# Patient Record
Sex: Female | Born: 1945 | Race: Black or African American | Hispanic: No | Smoking: Never smoker
Health system: Southern US, Community
[De-identification: ages and names within clinical notes are randomized; demographics above are authoritative.]

## PROBLEM LIST (undated history)

## (undated) DIAGNOSIS — E669 Obesity, unspecified: Secondary | ICD-10-CM

## (undated) DIAGNOSIS — R42 Dizziness and giddiness: Secondary | ICD-10-CM

## (undated) DIAGNOSIS — K219 Gastro-esophageal reflux disease without esophagitis: Secondary | ICD-10-CM

## (undated) DIAGNOSIS — D509 Iron deficiency anemia, unspecified: Secondary | ICD-10-CM

## (undated) DIAGNOSIS — K5900 Constipation, unspecified: Secondary | ICD-10-CM

## (undated) DIAGNOSIS — R1310 Dysphagia, unspecified: Secondary | ICD-10-CM

## (undated) DIAGNOSIS — E7800 Pure hypercholesterolemia, unspecified: Secondary | ICD-10-CM

## (undated) DIAGNOSIS — H409 Unspecified glaucoma: Secondary | ICD-10-CM

## (undated) DIAGNOSIS — I10 Essential (primary) hypertension: Secondary | ICD-10-CM

## (undated) DIAGNOSIS — Z8719 Personal history of other diseases of the digestive system: Secondary | ICD-10-CM

## (undated) DIAGNOSIS — F32A Depression, unspecified: Secondary | ICD-10-CM

## (undated) DIAGNOSIS — G9332 Myalgic encephalomyelitis/chronic fatigue syndrome: Secondary | ICD-10-CM

## (undated) DIAGNOSIS — S83249A Other tear of medial meniscus, current injury, unspecified knee, initial encounter: Secondary | ICD-10-CM

## (undated) DIAGNOSIS — M1990 Unspecified osteoarthritis, unspecified site: Secondary | ICD-10-CM

## (undated) HISTORY — DX: Unspecified glaucoma: H40.9

## (undated) HISTORY — DX: Depression, unspecified: F32.A

## (undated) HISTORY — PX: CHOLECYSTECTOMY: SHX55

## (undated) HISTORY — DX: Dysphagia, unspecified: R13.10

## (undated) HISTORY — DX: Constipation, unspecified: K59.00

## (undated) HISTORY — DX: Dizziness and giddiness: R42

## (undated) HISTORY — PX: SHOULDER ARTHROSCOPY: SHX128

## (undated) HISTORY — PX: TONSILLECTOMY: SUR1361

## (undated) HISTORY — DX: Obesity, unspecified: E66.9

## (undated) HISTORY — DX: Iron deficiency anemia, unspecified: D50.9

## (undated) HISTORY — DX: Myalgic encephalomyelitis/chronic fatigue syndrome: G93.32

## (undated) HISTORY — PX: TUBAL LIGATION: SHX77

---

## 1998-07-16 ENCOUNTER — Encounter: Payer: Self-pay | Admitting: Gastroenterology

## 1998-07-16 ENCOUNTER — Ambulatory Visit (HOSPITAL_COMMUNITY): Admission: RE | Admit: 1998-07-16 | Discharge: 1998-07-16 | Payer: Self-pay | Admitting: Gastroenterology

## 1998-10-05 HISTORY — PX: TUBAL LIGATION: SHX77

## 1998-10-28 ENCOUNTER — Ambulatory Visit (HOSPITAL_COMMUNITY): Admission: RE | Admit: 1998-10-28 | Discharge: 1998-10-28 | Payer: Self-pay | Admitting: Gastroenterology

## 1999-01-22 ENCOUNTER — Encounter: Payer: Self-pay | Admitting: Family Medicine

## 1999-01-22 ENCOUNTER — Ambulatory Visit (HOSPITAL_COMMUNITY): Admission: RE | Admit: 1999-01-22 | Discharge: 1999-01-22 | Payer: Self-pay | Admitting: Family Medicine

## 1999-02-10 ENCOUNTER — Encounter: Payer: Self-pay | Admitting: Surgery

## 1999-02-11 ENCOUNTER — Encounter: Payer: Self-pay | Admitting: Surgery

## 1999-02-11 ENCOUNTER — Ambulatory Visit (HOSPITAL_COMMUNITY): Admission: RE | Admit: 1999-02-11 | Discharge: 1999-02-12 | Payer: Self-pay | Admitting: Surgery

## 1999-07-17 ENCOUNTER — Other Ambulatory Visit: Admission: RE | Admit: 1999-07-17 | Discharge: 1999-07-17 | Payer: Self-pay | Admitting: Family Medicine

## 1999-08-05 ENCOUNTER — Encounter: Admission: RE | Admit: 1999-08-05 | Discharge: 1999-08-05 | Payer: Self-pay | Admitting: Family Medicine

## 1999-08-05 ENCOUNTER — Encounter: Payer: Self-pay | Admitting: Family Medicine

## 2000-07-15 ENCOUNTER — Other Ambulatory Visit: Admission: RE | Admit: 2000-07-15 | Discharge: 2000-07-15 | Payer: Self-pay | Admitting: Family Medicine

## 2001-01-24 ENCOUNTER — Encounter: Admission: RE | Admit: 2001-01-24 | Discharge: 2001-01-24 | Payer: Self-pay | Admitting: Family Medicine

## 2001-01-24 ENCOUNTER — Encounter: Payer: Self-pay | Admitting: Family Medicine

## 2001-02-03 ENCOUNTER — Encounter: Admission: RE | Admit: 2001-02-03 | Discharge: 2001-02-03 | Payer: Self-pay | Admitting: Family Medicine

## 2001-02-03 ENCOUNTER — Encounter: Payer: Self-pay | Admitting: Family Medicine

## 2001-03-25 ENCOUNTER — Encounter: Admission: RE | Admit: 2001-03-25 | Discharge: 2001-03-25 | Payer: Self-pay | Admitting: Gastroenterology

## 2001-03-25 ENCOUNTER — Encounter: Payer: Self-pay | Admitting: Gastroenterology

## 2002-03-14 ENCOUNTER — Encounter: Payer: Self-pay | Admitting: Family Medicine

## 2002-03-14 ENCOUNTER — Encounter: Admission: RE | Admit: 2002-03-14 | Discharge: 2002-03-14 | Payer: Self-pay | Admitting: Family Medicine

## 2002-03-22 ENCOUNTER — Other Ambulatory Visit: Admission: RE | Admit: 2002-03-22 | Discharge: 2002-03-22 | Payer: Self-pay | Admitting: Family Medicine

## 2003-05-01 ENCOUNTER — Other Ambulatory Visit: Admission: RE | Admit: 2003-05-01 | Discharge: 2003-05-01 | Payer: Self-pay | Admitting: Family Medicine

## 2003-05-11 ENCOUNTER — Encounter: Payer: Self-pay | Admitting: Family Medicine

## 2003-05-11 ENCOUNTER — Encounter: Admission: RE | Admit: 2003-05-11 | Discharge: 2003-05-11 | Payer: Self-pay | Admitting: Family Medicine

## 2003-08-28 ENCOUNTER — Ambulatory Visit (HOSPITAL_COMMUNITY): Admission: RE | Admit: 2003-08-28 | Discharge: 2003-08-28 | Payer: Self-pay | Admitting: Gastroenterology

## 2003-08-29 ENCOUNTER — Encounter: Admission: RE | Admit: 2003-08-29 | Discharge: 2003-08-29 | Payer: Self-pay | Admitting: Family Medicine

## 2003-12-26 ENCOUNTER — Encounter: Admission: RE | Admit: 2003-12-26 | Discharge: 2003-12-26 | Payer: Self-pay | Admitting: Family Medicine

## 2003-12-28 ENCOUNTER — Ambulatory Visit (HOSPITAL_BASED_OUTPATIENT_CLINIC_OR_DEPARTMENT_OTHER): Admission: RE | Admit: 2003-12-28 | Discharge: 2003-12-28 | Payer: Self-pay | Admitting: Family Medicine

## 2005-08-06 ENCOUNTER — Other Ambulatory Visit: Admission: RE | Admit: 2005-08-06 | Discharge: 2005-08-06 | Payer: Self-pay | Admitting: Family Medicine

## 2005-09-10 ENCOUNTER — Encounter: Admission: RE | Admit: 2005-09-10 | Discharge: 2005-09-10 | Payer: Self-pay | Admitting: Family Medicine

## 2006-08-17 ENCOUNTER — Encounter: Admission: RE | Admit: 2006-08-17 | Discharge: 2006-08-17 | Payer: Self-pay | Admitting: Family Medicine

## 2006-10-12 ENCOUNTER — Encounter: Admission: RE | Admit: 2006-10-12 | Discharge: 2006-10-12 | Payer: Self-pay | Admitting: Family Medicine

## 2006-11-10 ENCOUNTER — Encounter: Admission: RE | Admit: 2006-11-10 | Discharge: 2006-11-10 | Payer: Self-pay | Admitting: Family Medicine

## 2007-06-20 ENCOUNTER — Encounter: Admission: RE | Admit: 2007-06-20 | Discharge: 2007-06-20 | Payer: Self-pay | Admitting: Otolaryngology

## 2007-09-11 ENCOUNTER — Emergency Department (HOSPITAL_COMMUNITY): Admission: EM | Admit: 2007-09-11 | Discharge: 2007-09-11 | Payer: Self-pay | Admitting: Family Medicine

## 2007-10-12 ENCOUNTER — Encounter: Admission: RE | Admit: 2007-10-12 | Discharge: 2007-10-12 | Payer: Self-pay | Admitting: Family Medicine

## 2008-03-20 ENCOUNTER — Other Ambulatory Visit: Admission: RE | Admit: 2008-03-20 | Discharge: 2008-03-20 | Payer: Self-pay | Admitting: Family Medicine

## 2008-10-15 ENCOUNTER — Encounter: Admission: RE | Admit: 2008-10-15 | Discharge: 2008-10-15 | Payer: Self-pay | Admitting: Family Medicine

## 2009-05-23 ENCOUNTER — Encounter: Admission: RE | Admit: 2009-05-23 | Discharge: 2009-05-23 | Payer: Self-pay | Admitting: Family Medicine

## 2009-11-04 ENCOUNTER — Emergency Department (HOSPITAL_COMMUNITY): Admission: EM | Admit: 2009-11-04 | Discharge: 2009-11-05 | Payer: Self-pay | Admitting: Emergency Medicine

## 2009-11-26 ENCOUNTER — Emergency Department (HOSPITAL_COMMUNITY): Admission: EM | Admit: 2009-11-26 | Discharge: 2009-11-26 | Payer: Self-pay | Admitting: Emergency Medicine

## 2009-12-25 ENCOUNTER — Other Ambulatory Visit: Admission: RE | Admit: 2009-12-25 | Discharge: 2009-12-25 | Payer: Self-pay | Admitting: Family Medicine

## 2010-03-12 ENCOUNTER — Encounter: Admission: RE | Admit: 2010-03-12 | Discharge: 2010-03-12 | Payer: Self-pay | Admitting: Family Medicine

## 2010-10-25 ENCOUNTER — Encounter: Payer: Self-pay | Admitting: Family Medicine

## 2010-10-26 ENCOUNTER — Encounter: Payer: Self-pay | Admitting: Family Medicine

## 2010-12-22 LAB — POCT I-STAT, CHEM 8
BUN: 12 mg/dL (ref 6–23)
Calcium, Ion: 1.07 mmol/L — ABNORMAL LOW (ref 1.12–1.32)
Chloride: 105 mEq/L (ref 96–112)
Creatinine, Ser: 1.1 mg/dL (ref 0.4–1.2)
Glucose, Bld: 103 mg/dL — ABNORMAL HIGH (ref 70–99)
HCT: 43 % (ref 36.0–46.0)
Hemoglobin: 14.6 g/dL (ref 12.0–15.0)
Potassium: 4.3 mEq/L (ref 3.5–5.1)
Sodium: 140 mEq/L (ref 135–145)
TCO2: 28 mmol/L (ref 0–100)

## 2010-12-22 LAB — HEPATIC FUNCTION PANEL
ALT: 21 U/L (ref 0–35)
AST: 24 U/L (ref 0–37)
Albumin: 3.1 g/dL — ABNORMAL LOW (ref 3.5–5.2)
Alkaline Phosphatase: 73 U/L (ref 39–117)
Bilirubin, Direct: 0.1 mg/dL (ref 0.0–0.3)
Indirect Bilirubin: 0.3 mg/dL (ref 0.3–0.9)
Total Bilirubin: 0.4 mg/dL (ref 0.3–1.2)
Total Protein: 6.6 g/dL (ref 6.0–8.3)

## 2010-12-22 LAB — POCT CARDIAC MARKERS
CKMB, poc: <1 ng/mL — ABNORMAL LOW (ref 1.0–8.0)
Myoglobin, poc: 57.7 ng/mL (ref 12–200)
Troponin i, poc: <0.05 ng/mL (ref 0.00–0.09)

## 2010-12-22 LAB — D-DIMER, QUANTITATIVE: D-Dimer, Quant: 0.3 ug/mL-FEU (ref 0.00–0.48)

## 2010-12-25 LAB — POCT I-STAT, CHEM 8
BUN: 14 mg/dL (ref 6–23)
Calcium, Ion: 1.1 mmol/L — ABNORMAL LOW (ref 1.12–1.32)
Chloride: 106 mEq/L (ref 96–112)
Creatinine, Ser: 0.8 mg/dL (ref 0.4–1.2)
Glucose, Bld: 99 mg/dL (ref 70–99)
HCT: 39 % (ref 36.0–46.0)
Hemoglobin: 13.3 g/dL (ref 12.0–15.0)
Potassium: 3.9 mEq/L (ref 3.5–5.1)
Sodium: 140 mEq/L (ref 135–145)
TCO2: 27 mmol/L (ref 0–100)

## 2011-02-20 NOTE — Op Note (Signed)
NAME:  Shawna Thompson, Shawna Thompson                   ACCOUNT NO.:  [id number]   MEDICAL RECORD NO.:  [id number]                   PATIENT TYPE:  AMB   LOCATION:  ENDO                                 FACILITY[facility]  [facility]   PHYSICIAN:  John C. Madilyn Fireman, M.D.                 DATE OF BIRTH:  12-16-1945   DATE OF PROCEDURE:  08/28/2003  DATE OF DISCHARGE:                                 OPERATIVE REPORT   PROCEDURE:  Colonoscopy.   INDICATION FOR PROCEDURE:  Anemia and constipation in Thompson 65 year old patient  with no recent colon screening.   DESCRIPTION OF PROCEDURE:  The patient was placed in the left lateral  decubitus position and placed on the pulse monitor with continuous low-flow  oxygen delivered by nasal cannula.  She was sedated with 75 mcg IV fentanyl  and 7.5 mg IV Versed.  The Olympus video colonoscope was inserted into the  rectum and advanced to the cecum, confirmed by transillumination at  McBurney's point and visualization of the ileocecal valve and appendiceal  orifice.  The prep was excellent.  The cecum, ascending, transverse,  descending, and sigmoid colon all appeared normal with no masses, polyps,  diverticula, or other mucosal abnormalities.  The rectum likewise appeared  normal, and retroflexed view of the anus revealed small internal  hemorrhoids.  The scope was then withdrawn and the patient returned to the  recovery room in stable condition.  She tolerated the procedure well, and  there were no immediate complications.   IMPRESSION:  1. Internal hemorrhoids.  2. Otherwise normal study.   PLAN:  Will treat constipation with an osmotic laxative.                                               John C. Madilyn Fireman, M.D.    JCH/MEDQ  D:  08/28/2003  T:  08/28/2003  Job:  [id number]   cc:   Chales Salmon. Abigail Miyamoto, M.D.  [address]  [place]  [place] [address]  Fax: [phone]

## 2011-03-06 ENCOUNTER — Other Ambulatory Visit: Payer: Self-pay | Admitting: Gastroenterology

## 2011-03-12 ENCOUNTER — Ambulatory Visit
Admission: RE | Admit: 2011-03-12 | Discharge: 2011-03-12 | Disposition: A | Discharge status: Home / Self Care | Payer: 59 | Source: Ambulatory Visit | Attending: Gastroenterology | Admitting: Gastroenterology

## 2012-06-08 ENCOUNTER — Other Ambulatory Visit: Payer: Self-pay | Admitting: Internal Medicine

## 2012-06-08 ENCOUNTER — Other Ambulatory Visit: Payer: Self-pay | Admitting: Family Medicine

## 2012-06-08 DIAGNOSIS — Z1231 Encounter for screening mammogram for malignant neoplasm of breast: Secondary | ICD-10-CM

## 2012-06-22 ENCOUNTER — Ambulatory Visit
Admission: RE | Admit: 2012-06-22 | Discharge: 2012-06-22 | Disposition: A | Discharge status: Home / Self Care | Payer: 59 | Source: Ambulatory Visit | Attending: Internal Medicine | Admitting: Internal Medicine

## 2012-06-22 DIAGNOSIS — Z1231 Encounter for screening mammogram for malignant neoplasm of breast: Secondary | ICD-10-CM

## 2013-07-12 ENCOUNTER — Other Ambulatory Visit (HOSPITAL_COMMUNITY)
Admission: RE | Admit: 2013-07-12 | Discharge: 2013-07-12 | Disposition: A | Discharge status: Home / Self Care | Payer: PRIVATE HEALTH INSURANCE | Source: Ambulatory Visit | Attending: Obstetrics & Gynecology | Admitting: Obstetrics & Gynecology

## 2013-07-12 ENCOUNTER — Other Ambulatory Visit: Payer: Self-pay | Admitting: Obstetrics & Gynecology

## 2013-07-12 DIAGNOSIS — Z1151 Encounter for screening for human papillomavirus (HPV): Secondary | ICD-10-CM | POA: Insufficient documentation

## 2013-07-12 DIAGNOSIS — Z01419 Encounter for gynecological examination (general) (routine) without abnormal findings: Secondary | ICD-10-CM | POA: Insufficient documentation

## 2013-09-19 HISTORY — PX: COLONOSCOPY: SHX174

## 2013-10-12 ENCOUNTER — Other Ambulatory Visit: Payer: Self-pay | Admitting: Internal Medicine

## 2013-10-12 DIAGNOSIS — E2839 Other primary ovarian failure: Secondary | ICD-10-CM

## 2013-11-06 ENCOUNTER — Ambulatory Visit
Admission: RE | Admit: 2013-11-06 | Discharge: 2013-11-06 | Disposition: A | Discharge status: Home / Self Care | Payer: PRIVATE HEALTH INSURANCE | Source: Ambulatory Visit | Attending: Internal Medicine | Admitting: Internal Medicine

## 2013-11-06 DIAGNOSIS — E2839 Other primary ovarian failure: Secondary | ICD-10-CM

## 2014-03-03 ENCOUNTER — Encounter (HOSPITAL_COMMUNITY): Payer: Self-pay | Admitting: Emergency Medicine

## 2014-03-03 ENCOUNTER — Emergency Department (HOSPITAL_COMMUNITY)
Admission: EM | Admit: 2014-03-03 | Discharge: 2014-03-04 | Disposition: A | Discharge status: Home / Self Care | Payer: PRIVATE HEALTH INSURANCE | Attending: Emergency Medicine | Admitting: Emergency Medicine

## 2014-03-03 ENCOUNTER — Emergency Department (HOSPITAL_COMMUNITY): Payer: PRIVATE HEALTH INSURANCE

## 2014-03-03 DIAGNOSIS — R5381 Other malaise: Secondary | ICD-10-CM | POA: Insufficient documentation

## 2014-03-03 DIAGNOSIS — N390 Urinary tract infection, site not specified: Secondary | ICD-10-CM | POA: Insufficient documentation

## 2014-03-03 DIAGNOSIS — J159 Unspecified bacterial pneumonia: Secondary | ICD-10-CM | POA: Insufficient documentation

## 2014-03-03 DIAGNOSIS — R5383 Other fatigue: Secondary | ICD-10-CM

## 2014-03-03 DIAGNOSIS — J189 Pneumonia, unspecified organism: Secondary | ICD-10-CM

## 2014-03-03 DIAGNOSIS — I10 Essential (primary) hypertension: Secondary | ICD-10-CM | POA: Insufficient documentation

## 2014-03-03 DIAGNOSIS — R000 Tachycardia, unspecified: Secondary | ICD-10-CM | POA: Insufficient documentation

## 2014-03-03 HISTORY — DX: Essential (primary) hypertension: I10

## 2014-03-03 LAB — CBC WITH DIFFERENTIAL/PLATELET
Basophils Absolute: 0 10*3/uL (ref 0.0–0.1)
Basophils Relative: 0 % (ref 0–1)
Eosinophils Absolute: 0.3 10*3/uL (ref 0.0–0.7)
Eosinophils Relative: 3 % (ref 0–5)
HEMATOCRIT: 37.4 % (ref 36.0–46.0)
HEMOGLOBIN: 12.3 g/dL (ref 12.0–15.0)
LYMPHS PCT: 25 % (ref 12–46)
Lymphs Abs: 2.3 10*3/uL (ref 0.7–4.0)
MCH: 25.7 pg — AB (ref 26.0–34.0)
MCHC: 32.9 g/dL (ref 30.0–36.0)
MCV: 78.2 fL (ref 78.0–100.0)
MONOS PCT: 6 % (ref 3–12)
Monocytes Absolute: 0.5 10*3/uL (ref 0.1–1.0)
Neutro Abs: 6.2 10*3/uL (ref 1.7–7.7)
Neutrophils Relative %: 66 % (ref 43–77)
Platelets: 245 10*3/uL (ref 150–400)
RBC: 4.78 MIL/uL (ref 3.87–5.11)
RDW: 14.9 % (ref 11.5–15.5)
WBC: 9.3 10*3/uL (ref 4.0–10.5)

## 2014-03-03 LAB — I-STAT CG4 LACTIC ACID, ED: LACTIC ACID, VENOUS: 1.15 mmol/L (ref 0.5–2.2)

## 2014-03-03 MED ORDER — LEVOFLOXACIN IN D5W 750 MG/150ML IV SOLN
750.0000 mg | Freq: Once | INTRAVENOUS | Status: AC
  Administered 2014-03-04: 750 mg via INTRAVENOUS
  Filled 2014-03-03: qty 150

## 2014-03-03 MED ORDER — SODIUM CHLORIDE 0.9 % IV BOLUS (SEPSIS)
1000.0000 mL | Freq: Once | INTRAVENOUS | Status: AC
  Administered 2014-03-04: 1000 mL via INTRAVENOUS

## 2014-03-03 MED ORDER — ACETAMINOPHEN 325 MG PO TABS
650.0000 mg | ORAL_TABLET | Freq: Once | ORAL | Status: AC
  Administered 2014-03-03: 650 mg via ORAL
  Filled 2014-03-03: qty 2

## 2014-03-03 NOTE — ED Provider Notes (Signed)
CSN: [id number]     Arrival date & time 03/03/14  2104 History   First MD Initiated Contact with Patient 03/03/14 2302     Chief Complaint  Patient presents with  . Cough     (Consider location/radiation/quality/duration/timing/severity/associated sxs/prior Treatment) HPI Patient complains of cough for the past month. She states over the last 2 days it has become productive and worsened. She is now receiving yellow sputum and complains of some minor sinus congestion and sore throat. She denies any sick contacts. She has mild shortness of breath especially when being outside. She denies any lower extremity swelling or pain. No recent hospitalizations. Denies subjective fevers. States she is feeling much better after getting Tylenol in triage. Denies any chest pain, neck pain or stiffness. She currently has no abdominal pain, nausea or vomiting. She denies any urinary symptoms. Past Medical History  Diagnosis Date  . Hypertension    History reviewed. No pertinent past surgical history. No family history on file. History  Substance Use Topics  . Smoking status: Never Smoker   . Smokeless tobacco: Not on file  . Alcohol Use: No   OB History   Grav Para Term Preterm Abortions TAB SAB Ect Mult Living                 Review of Systems  Constitutional: Positive for fatigue. Negative for fever and chills.  HENT: Positive for congestion, sinus pressure and sore throat.   Respiratory: Positive for cough and shortness of breath. Negative for wheezing.   Cardiovascular: Negative for chest pain, palpitations and leg swelling.  Gastrointestinal: Negative for nausea, vomiting, abdominal pain and diarrhea.  Genitourinary: Negative for dysuria, frequency and flank pain.  Musculoskeletal: Negative for arthralgias, back pain, neck pain and neck stiffness.  Skin: Negative for rash and wound.  Neurological: Negative for dizziness, weakness, light-headedness, numbness and headaches.  All other  systems reviewed and are negative.     Allergies  Review of patient's allergies indicates no known allergies.  Home Medications   Prior to Admission medications   Not on File   BP 164/86  Pulse 117  Temp(Src) 99.4 F (37.4 C) (Oral)  Resp 22  Ht 5\' 1"  (1.549 m)  Wt 207 lb (93.895 kg)  BMI 39.13 kg/m2  SpO2 98% Physical Exam  Nursing note and vitals reviewed. Constitutional: She is oriented to person, place, and time. She appears well-developed and well-nourished. No distress.  HENT:  Head: Normocephalic and atraumatic.  Mouth/Throat: Oropharynx is clear and moist. No oropharyngeal exudate.  Mild generalized tenderness to percussion of bilateral frontal and maxillary sinuses. Patient has mild mucosal edema in bilateral nares  Eyes: EOM are normal. Pupils are equal, round, and reactive to light.  Neck: Normal range of motion. Neck supple.  No meningismus  Cardiovascular: Regular rhythm.  Exam reveals no gallop and no friction rub.   No murmur heard. Tachycardia  Pulmonary/Chest: Effort normal. No respiratory distress. She has no wheezes. She has rales (Rales in the right middle and lower lobe). She exhibits no tenderness.  Abdominal: Soft. Bowel sounds are normal. She exhibits no distension and no mass. There is no tenderness. There is no rebound and no guarding.  Musculoskeletal: Normal range of motion. She exhibits no edema and no tenderness.  No CVA tenderness. No calf swelling or tenderness.  Neurological: She is alert and oriented to person, place, and time.  Moves all extremities without deficit. Sensation is grossly intact.  Skin: Skin is warm and dry. No  rash noted. No erythema.  Psychiatric: She has a normal mood and affect. Her behavior is normal.    ED Course  Procedures (including critical care time) Labs Review Labs Reviewed  CBC WITH DIFFERENTIAL  COMPREHENSIVE METABOLIC PANEL  URINALYSIS, ROUTINE W REFLEX MICROSCOPIC  I-STAT CG4 LACTIC ACID, ED     Imaging Review Dg Chest 2 View  03/03/2014   CLINICAL DATA:  68 year old female with cough for 1 month associated with chest and back pain. Initial encounter. Former smoker.  EXAM: CHEST  2 VIEW  COMPARISON:  Chest radiographs 11/04/2009.  FINDINGS: Mildly increased chronic elevation of the right hemidiaphragm on the PA view. Lung volumes and contour of the diaphragm appear stable on the lateral view. Stable cardiac size and mediastinal contours. Visualized tracheal air column is within normal limits. No pneumothorax or pulmonary edema. No pleural effusion or acute pulmonary opacity. No acute osseous abnormality identified. Stable right upper quadrant surgical clips.  IMPRESSION: Mild volume loss at the right lung base when compared to prior studies, but otherwise stable radiographic appearance of the chest. If the patient's cough and pain symptoms persist or remain unexplained, followup chest CT (IV contrast preferred) recommended for further evaluation in this setting.   Electronically Signed   By: Lars Pinks M.D.   On: 03/03/2014 22:36     EKG Interpretation None      MDM   Final diagnoses:  None    Right Baseline lozenge chest x-ray. Patient has rales in the same area with fever, tachycardia and productive cough. Concern for developing pneumonia. We'll treat with IV fluids and antibiotics and reassess. Patient may be a candidate for outpatient treatment.  Patient's heart rate improved with IV fluids to below 100. She is resting comfortably and not necessitating supplemental oxygen. She is requesting discharge home. Return precautions have been given and advised her to followup with health care professional early next week.  Julianne Rice, MD 03/04/14 [phone]

## 2014-03-03 NOTE — ED Notes (Signed)
The pt has had a cold and cough  For one month.  Productive cough yellow she feels like she has head stuffiness and some throat discomfort with sneezing today

## 2014-03-03 NOTE — ED Notes (Signed)
Pt unable to urinate at this time.  

## 2014-03-04 LAB — URINE MICROSCOPIC-ADD ON

## 2014-03-04 LAB — URINALYSIS, ROUTINE W REFLEX MICROSCOPIC
Bilirubin Urine: NEGATIVE
Glucose, UA: NEGATIVE mg/dL
Ketones, ur: NEGATIVE mg/dL
Nitrite: NEGATIVE
PROTEIN: NEGATIVE mg/dL
Specific Gravity, Urine: 1.013 (ref 1.005–1.030)
UROBILINOGEN UA: 0.2 mg/dL (ref 0.0–1.0)
pH: 5.5 (ref 5.0–8.0)

## 2014-03-04 LAB — COMPREHENSIVE METABOLIC PANEL
ALT: 11 U/L (ref 0–35)
AST: 17 U/L (ref 0–37)
Albumin: 3.4 g/dL — ABNORMAL LOW (ref 3.5–5.2)
Alkaline Phosphatase: 97 U/L (ref 39–117)
BUN: 13 mg/dL (ref 6–23)
CALCIUM: 8.7 mg/dL (ref 8.4–10.5)
CO2: 22 meq/L (ref 19–32)
Chloride: 103 mEq/L (ref 96–112)
Creatinine, Ser: 0.93 mg/dL (ref 0.50–1.10)
GFR calc non Af Amer: 63 mL/min — ABNORMAL LOW (ref >90)
GFR, EST AFRICAN AMERICAN: 73 mL/min — AB (ref >90)
GLUCOSE: 108 mg/dL — AB (ref 70–99)
Potassium: 3.5 mEq/L — ABNORMAL LOW (ref 3.7–5.3)
Sodium: 139 mEq/L (ref 137–147)
Total Bilirubin: 0.2 mg/dL — ABNORMAL LOW (ref 0.3–1.2)
Total Protein: 7.1 g/dL (ref 6.0–8.3)

## 2014-03-04 MED ORDER — LEVOFLOXACIN 500 MG PO TABS: 500.0000 mg | ORAL_TABLET | Freq: Every day | ORAL | Status: DC

## 2014-03-04 MED ORDER — BENZONATATE 100 MG PO CAPS: 100.0000 mg | ORAL_CAPSULE | Freq: Three times a day (TID) | ORAL | Status: DC

## 2014-03-04 NOTE — ED Notes (Signed)
Pt aware urine is needed for testing- still unable to urinate at this time.

## 2014-03-04 NOTE — Discharge Instructions (Signed)
Followup with the health care professional early next week to assure improvement of your infectious symptoms. You've may take Tylenol and ibuprofen for fever and muscle aches. Return immediately for worsening shortness of breath or for any concerns  Pneumonia, Adult Pneumonia is an infection of the lungs.  CAUSES Pneumonia may be caused by bacteria or a virus. Usually, these infections are caused by breathing infectious particles into the lungs (respiratory tract). SYMPTOMS   Cough.  Fever.  Chest pain.  Increased rate of breathing.  Wheezing.  Mucus production. DIAGNOSIS  If you have the common symptoms of pneumonia, your caregiver will typically confirm the diagnosis with a chest X-ray. The X-ray will show an abnormality in the lung (pulmonary infiltrate) if you have pneumonia. Other tests of your blood, urine, or sputum may be done to find the specific cause of your pneumonia. Your caregiver may also do tests (blood gases or pulse oximetry) to see how well your lungs are working. TREATMENT  Some forms of pneumonia may be spread to other people when you cough or sneeze. You may be asked to wear a mask before and during your exam. Pneumonia that is caused by bacteria is treated with antibiotic medicine. Pneumonia that is caused by the influenza virus may be treated with an antiviral medicine. Most other viral infections must run their course. These infections will not respond to antibiotics.  PREVENTION A pneumococcal shot (vaccine) is available to prevent a common bacterial cause of pneumonia. This is usually suggested for:  People over 58 years old.  Patients on chemotherapy.  People with chronic lung problems, such as bronchitis or emphysema.  People with immune system problems. If you are over 65 or have a high risk condition, you may receive the pneumococcal vaccine if you have not received it before. In some countries, a routine influenza vaccine is also recommended. This  vaccine can help prevent some cases of pneumonia.You may be offered the influenza vaccine as part of your care. If you smoke, it is time to quit. You may receive instructions on how to stop smoking. Your caregiver can provide medicines and counseling to help you quit. HOME CARE INSTRUCTIONS   Cough suppressants may be used if you are losing too much rest. However, coughing protects you by clearing your lungs. You should avoid using cough suppressants if you can.  Your caregiver may have prescribed medicine if he or she thinks your pneumonia is caused by a bacteria or influenza. Finish your medicine even if you start to feel better.  Your caregiver may also prescribe an expectorant. This loosens the mucus to be coughed up.  Only take over-the-counter or prescription medicines for pain, discomfort, or fever as directed by your caregiver.  Do not smoke. Smoking is a common cause of bronchitis and can contribute to pneumonia. If you are a smoker and continue to smoke, your cough may last several weeks after your pneumonia has cleared.  A cold steam vaporizer or humidifier in your room or home may help loosen mucus.  Coughing is often worse at night. Sleeping in a semi-upright position in a recliner or using a couple pillows under your head will help with this.  Get rest as you feel it is needed. Your body will usually let you know when you need to rest. SEEK IMMEDIATE MEDICAL CARE IF:   Your illness becomes worse. This is especially true if you are elderly or weakened from any other disease.  You cannot control your cough with suppressants and  are losing sleep.  You begin coughing up blood.  You develop pain which is getting worse or is uncontrolled with medicines.  You have a fever.  Any of the symptoms which initially brought you in for treatment are getting worse rather than better.  You develop shortness of breath or chest pain. MAKE SURE YOU:   Understand these  instructions.  Will watch your condition.  Will get help right away if you are not doing well or get worse. Document Released: 09/21/2005 Document Revised: 12/14/2011 Document Reviewed: 12/11/2010 [facility] Patient Information 2014 [employer], [place].  Emergency Department Resource Guide 1) Find a Doctor and Pay Out of Pocket Although you won't have to find out who is covered by your insurance plan, it is a good idea to ask around and get recommendations. You will then need to call the office and see if the doctor you have chosen will accept you as a new patient and what types of options they offer for patients who are self-pay. Some doctors offer discounts or will set up payment plans for their patients who do not have insurance, but you will need to ask so you aren't surprised when you get to your appointment.  2) Contact Your Local Health Department Not all health departments have doctors that can see patients for sick visits, but many do, so it is worth a call to see if yours does. If you don't know where your local health department is, you can check in your phone book. The CDC also has a tool to help you locate your state's health department, and many state websites also have listings of all of their local health departments.  3) Find a [facility] If your illness is not likely to be very severe or complicated, you may want to try a walk in clinic. These are popping up all over the country in pharmacies, drugstores, and shopping centers. They're usually staffed by nurse practitioners or physician assistants that have been trained to treat common illnesses and complaints. They're usually fairly quick and inexpensive. However, if you have serious medical issues or chronic medical problems, these are probably not your best option.  No Primary Care Doctor: - Call Health Connect at  [phone] - they can help you locate a primary care doctor that  accepts your insurance, provides certain services,  etc. - Physician Referral Service- [phone]  Chronic Pain Problems: Organization         Address  Phone   Notes  [facility]  [phone] Patients need to be referred by their primary care doctor.   Medication Assistance: Organization         Address  Phone   Notes  [facility] Medication [facility] [place] [address] [place], [place] [address] [phone] --Must be a resident of [facility] -- Must have NO insurance coverage whatsoever (no Medicaid/ Medicare, etc.) -- The pt. MUST have a primary care doctor that directs their care regularly and follows them in the community   MedAssist  [phone]   [employer]  [phone]    Agencies that provide inexpensive medical care: Organization         Address  Phone   Notes  [place]  [phone]   [facility]    [phone]   [facility] [place], [place] [address] [phone]   [place] Bonnie (  [phone]   Planned Parenthood    [phone]   [facility]    [phone]   [address], [place] Phone:  [phone], Fax:  [phone] Hours of Operation:  9 am - 6 pm, M-F.  Also accepts Medicaid/Medicare and self-pay.  [facility] for [place], [address], [place] Phone: [phone], Fax: [phone]. Hours of Operation:  8:30 am - 5:30 pm, M-F.  Also accepts Medicaid and self-pay.  [facility] [place] [address], [place] Phone: [phone]   [place], [place], [place] [phone], Ext. [phone] Mondays & Thursdays: 7-9 AM.  First 15 patients are seen on a first come, first serve basis.    [place] Providers:  Organization         Address  Phone   Notes  [facility] [address], [place] [phone] Also accepts self-pay patients.  [facility] [id number] [place], [place]  [phone]   [place], [address], [place] [phone]   [facility] [address] [place] [phone]   Lucianne Lei [address], [place]   [phone] Only accepts [place] Access [place] patients after they have their name applied to their card.   Self-Pay (no insurance) in [facility]:  Organization         Address  Phone   Notes  Sickle Cell Patients, [facility] [place] [phone]   [facility] Urgent Care [place] [phone]   [facility] [place]  [place], [place], [place] [phone]   [facility]/Dr. Osei-Bonsu  [address] [place] or [address], [place] [phone] Phone number for both [place] and [place] locations is the same.  Urgent Medical and [facility] [address] [place] [phone]   [facility] [address] [place] or [address] [phone]   [facility] [address] [place] [phone], phone; [phone], fax Sees patients 1st and 3rd Saturday of every month.  Must not qualify for public or private insurance (i.e. Medicaid, Medicare, [place] Health Choice, Veterans' Benefits)  Household income should be no more than 200% of the poverty level The clinic cannot treat you if you are pregnant or think you are pregnant  Sexually transmitted diseases are not treated at the clinic.    Dental Care: Organization         Address  Phone  Notes  [facility] Department of [facility] [place] [phone] Accepts children up to  age 2 who are enrolled in [place] or [place]; pregnant women with a Medicaid card; and children who have applied for Medicaid or  Health Choice, but were declined, whose parents can pay a reduced fee at time of service.  [facility] Department of [facility]  [address], [place] [phone] Accepts children up to age 58 who are enrolled in [place] or [place]; pregnant women with a Medicaid card; and children who have applied for Medicaid or [place]  Choice, but were declined, whose parents can pay a reduced fee at time of service.  [facility] Adult Dental Access PROGRAM  [place] [phone] Patients are seen by appointment only. Walk-ins are not accepted. [place] will see patients 14 years of age and older. Monday - Tuesday (8am-5pm) Most Wednesdays (8:30-5pm) $30 per visit, cash only  [facility] Adult Dental Access PROGRAM  [address], [facility] [phone] Patients are seen by appointment only. Walk-ins are not accepted. [place] will see patients 65 years of age and older. One Wednesday Evening (Monthly: Volunteer Based).  $30 per visit, cash only  [place]  [phone] for adults; Children under age 61, call Graduate Pediatric Dentistry at [phone]. Children aged 53-14, please call [phone] to request a pediatric application.  Dental services are provided in all areas of dental care including fillings, crowns and bridges, complete and partial dentures, implants, gum treatment, root canals, and extractions. Preventive care is also provided. Treatment is provided to both adults and children. Patients are selected via a lottery and there is often a waiting list.   [facility] [address] [place]  [phone] [email or web address]   [facility] [address] [place], [place] [phone], Ext. [phone] Second and Fourth Thursday of  each month, opens at 6:30 AM; Clinic ends at 9 AM.  Patients are seen on a first-come first-served basis, and a limited number are seen during each clinic.   [facility]  [address] [place], [place] [phone]   Eligibility Requirements You must have lived in [place], [place], or [place] counties for at least the last three months.   You cannot be eligible for state or federal sponsored [employer], including [employer], [place], or [employer].   You generally cannot be eligible for healthcare insurance through your employer.    How to apply: Eligibility screenings are held every Tuesday and Wednesday afternoon from 1:00 pm until 4:00 pm. You do not need an appointment for the interview!  [facility] [address] [place], [place]   [place]  [place] Department  [place]  [phone]    Behavioral Health Resources in the Community: Intensive Outpatient Programs Organization         Address  Phone  Notes  [place]. [address], [place], [place] [phone]   [employer] Outpatient [address] [place], [place]   ADS: Alcohol & Drug Svcs [address], [place], [place]   [place] [address],  [place], [place] or [phone]   Substance Abuse Resources Organization         Address  Phone  Notes  Alcohol and Drug Services  [phone]   [place]  [phone]   [facility] [place]   Chinita Pester  [phone]   Residential & Outpatient Substance Abuse Program  [phone]   Psychological Services Organization         Address  Phone  Notes  [facility] [place]  [place]  [phone]   [place] [address]  [place] or [phone]    Mobile Crisis Teams Organization         Address  Phone  Notes  [facility]atives, Mobile Crisis Care Unit  [phone]   Assertive Psychotherapeutic Services  [address] [place], [place]   [facility] [address], [place] [phone]    Self-Help/Support Groups Organization         Address  Phone             Notes  [place]. of [place] - variety of support groups  [place] Call for more information  Narcotics Anonymous (NA), [facility] [address], [employer] [place]  2 meetings at this location   Special educational needs teacher         Address  Phone  Notes  ASAP Residential Treatment [place],    [place]  [phone]   [facility]  [address] [place] [phone], [place], [place]   [place], [place] [phone] Admissions: 8am-3pm M-F  Incentives Substance [place] [address]    [place], [place] [phone]   [facility] [address] [place], [place], [place]   [facility] [address],  [place], [place]   [facility] - Intensive Outpatient [address] Kristeen [address], [place], [place]   [facility] ([place].,  [place], [place] [phone] or [phone]   [facility]idential Treatment Services (RTS) [address], [place], [place] Accepts Medicaid  [facility] [place] [address]  [place] [phone] Substance Abuse/Addiction Treatment   [facility] Organization         Address  Phone  Notes  [facility]  [phone]   Domenic Schwab, PhD [address] [place]lis [place], [place]   [phone] or [phone]   [place], [place] [phone]    [facility] [address], [place], [place] [phone] Insurance/Medicaid/sponsorship through [facility] and Families [address] [place]                                    [place], [place] [phone] [place] [address] [place], [place] [phone]    Dr. Adele Schilder  [phone]   [facility] Dept. 1) [address], [place] 2) [place] 3)  [place], [place] [phone]  [phone]   [place] [phone] or [phone] (After Hours)

## 2014-11-01 ENCOUNTER — Emergency Department (INDEPENDENT_AMBULATORY_CARE_PROVIDER_SITE_OTHER)
Admission: EM | Admit: 2014-11-01 | Discharge: 2014-11-01 | Disposition: A | Discharge status: Home / Self Care | Payer: Medicare Other | Source: Home / Self Care

## 2014-11-01 ENCOUNTER — Encounter (HOSPITAL_COMMUNITY): Payer: Self-pay | Admitting: Emergency Medicine

## 2014-11-01 DIAGNOSIS — N390 Urinary tract infection, site not specified: Secondary | ICD-10-CM

## 2014-11-01 DIAGNOSIS — M62830 Muscle spasm of back: Secondary | ICD-10-CM

## 2014-11-01 HISTORY — DX: Pure hypercholesterolemia, unspecified: E78.00

## 2014-11-01 MED ORDER — FLUCONAZOLE 150 MG PO TABS: 150.0000 mg | ORAL_TABLET | Freq: Every day | ORAL | Status: DC

## 2014-11-01 MED ORDER — METHOCARBAMOL 500 MG PO TABS: 500.0000 mg | ORAL_TABLET | Freq: Four times a day (QID) | ORAL | Status: DC | PRN

## 2014-11-01 MED ORDER — DICLOFENAC SODIUM 75 MG PO TBEC: 75.0000 mg | DELAYED_RELEASE_TABLET | Freq: Two times a day (BID) | ORAL | Status: DC

## 2014-11-01 MED ORDER — PHENAZOPYRIDINE HCL 100 MG PO TABS: 100.0000 mg | ORAL_TABLET | Freq: Three times a day (TID) | ORAL | Status: DC | PRN

## 2014-11-01 MED ORDER — CEPHALEXIN 500 MG PO CAPS: 500.0000 mg | ORAL_CAPSULE | Freq: Three times a day (TID) | ORAL | Status: DC

## 2014-11-01 NOTE — ED Notes (Signed)
Pt started with possible UTI yesterday.  She has had frequency and urgency with urination, and suffers from lower abdominal pain after urination.

## 2014-11-01 NOTE — ED Provider Notes (Addendum)
CSN: [id number]     Arrival date & time 11/01/14  4008 History   None    Chief Complaint  Patient presents with  . Urinary Tract Infection  . Urinary Frequency  . Dysuria   (Consider location/radiation/quality/duration/timing/severity/associated sxs/prior Treatment) HPI  Dysuria: started yesterday. Getting worse. Intermittent. Frequency (20x overnight). Denies fevers, flank pain, lwr abd pain.   Back pain: started after coughing spell for a couple of days. Started 2 mo ago. Intermittent. L mid back . No better or worse. Non-radiating  Past Medical History  Diagnosis Date  . Hypertension   . High cholesterol    History reviewed. No pertinent past surgical history. History reviewed. No pertinent family history. History  Substance Use Topics  . Smoking status: Never Smoker   . Smokeless tobacco: Not on file  . Alcohol Use: No   OB History    No data available     Review of Systems Per HPI with all other pertinent systems negative.   Allergies  Review of patient's allergies indicates no known allergies.  Home Medications   Prior to Admission medications   Medication Sig Start Date End Date Taking? Authorizing Provider  aspirin EC 81 MG tablet Take 81 mg by mouth daily.   Yes Historical Provider, MD  esomeprazole (NEXIUM) 20 MG capsule Take 20 mg by mouth 2 (two) times daily before a meal.   Yes Historical Provider, MD  hydrochlorothiazide (HYDRODIURIL) 25 MG tablet Take 25 mg by mouth daily.   Yes Historical Provider, MD  simvastatin (ZOCOR) 10 MG tablet Take 10 mg by mouth daily.   Yes Historical Provider, MD  benzonatate (TESSALON) 100 MG capsule Take 1 capsule (100 mg total) by mouth every 8 (eight) hours. 03/04/14   Julianne Rice, MD  cephALEXin (KEFLEX) 500 MG capsule Take 1 capsule (500 mg total) by mouth 3 (three) times daily. 11/01/14   Waldemar Dickens, MD  diclofenac (VOLTAREN) 75 MG EC tablet Take 1 tablet (75 mg total) by mouth 2 (two) times daily. 11/01/14    Waldemar Dickens, MD  fluconazole (DIFLUCAN) 150 MG tablet Take 1 tablet (150 mg total) by mouth daily. Repeat dose in 3 days 11/01/14   Waldemar Dickens, MD  methocarbamol (ROBAXIN) 500 MG tablet Take 1-2 tablets (500-1,000 mg total) by mouth every 6 (six) hours as needed for muscle spasms. 11/01/14   Waldemar Dickens, MD  phenazopyridine (PYRIDIUM) 100 MG tablet Take 1-2 tablets (100-200 mg total) by mouth 3 (three) times daily as needed for pain. 11/01/14   Waldemar Dickens, MD   BP 147/82 mmHg  Pulse 98  Temp(Src) 99.2 F (37.3 C) (Oral)  Resp 16  SpO2 100% Physical Exam  Constitutional: She is oriented to person, place, and time. She appears well-developed and well-nourished. No distress.  HENT:  Head: Normocephalic and atraumatic.  Eyes: EOM are normal. Pupils are equal, round, and reactive to light.  Neck: Normal range of motion.  Cardiovascular: Normal rate and normal heart sounds.   No murmur heard. Pulmonary/Chest: Effort normal and breath sounds normal.  Abdominal: Soft. Bowel sounds are normal.  Suprapubic ttp. No CVA tenderness  Musculoskeletal: She exhibits no edema or tenderness.  Neurological: She is alert and oriented to person, place, and time.  Skin: Skin is warm. No rash noted. She is not diaphoretic.  Psychiatric: Her behavior is normal.    ED Course  Procedures (including critical care time) Labs Review Labs Reviewed  URINE CULTURE  URINALYSIS, ROUTINE W  REFLEX MICROSCOPIC    Imaging Review No results found.   MDM   1. UTI (lower urinary tract infection)   2. Back spasm    urine sent for micro-and culture. Start Keflex, Pyridium. Robaxin and Voltaren. Diflucan when necessary yeast infection after antibiotics Follow-up precautions given. All questions answered.  Linna Darner, MD Family Medicine 11/01/2014, 10:56 AM      Waldemar Dickens, MD 11/01/14 Opp, MD 11/01/14 1057

## 2014-11-01 NOTE — Discharge Instructions (Signed)
Please start the keflex for the urinary tract infection Please start the pyridium for bladder pain Please use the robaxin and voltaren for the back pain and make sure to stretch the area regularly Use the diflucan if you get a yeast infection

## 2014-11-03 LAB — URINE CULTURE

## 2014-11-05 NOTE — ED Notes (Signed)
Urine culture: > 100,000 colonies E. Coli.  Pt. adequately treated with Keflex. Shawna Thompson 11/05/2014

## 2015-01-09 ENCOUNTER — Other Ambulatory Visit: Payer: Self-pay

## 2015-01-09 DIAGNOSIS — Z1231 Encounter for screening mammogram for malignant neoplasm of breast: Secondary | ICD-10-CM

## 2015-01-25 ENCOUNTER — Ambulatory Visit
Admission: RE | Admit: 2015-01-25 | Discharge: 2015-01-25 | Disposition: A | Discharge status: Home / Self Care | Payer: Medicare HMO | Source: Ambulatory Visit

## 2015-01-25 ENCOUNTER — Ambulatory Visit: Payer: Medicare Other

## 2015-01-25 DIAGNOSIS — Z1231 Encounter for screening mammogram for malignant neoplasm of breast: Secondary | ICD-10-CM

## 2015-06-13 ENCOUNTER — Emergency Department (HOSPITAL_COMMUNITY)
Admission: EM | Admit: 2015-06-13 | Discharge: 2015-06-13 | Disposition: A | Discharge status: Home / Self Care | Payer: Medicare HMO | Attending: Emergency Medicine | Admitting: Emergency Medicine

## 2015-06-13 ENCOUNTER — Encounter (HOSPITAL_COMMUNITY): Payer: Self-pay | Admitting: Emergency Medicine

## 2015-06-13 DIAGNOSIS — Z7982 Long term (current) use of aspirin: Secondary | ICD-10-CM | POA: Diagnosis not present

## 2015-06-13 DIAGNOSIS — E780 Pure hypercholesterolemia: Secondary | ICD-10-CM | POA: Diagnosis not present

## 2015-06-13 DIAGNOSIS — Z792 Long term (current) use of antibiotics: Secondary | ICD-10-CM | POA: Insufficient documentation

## 2015-06-13 DIAGNOSIS — M79642 Pain in left hand: Secondary | ICD-10-CM | POA: Diagnosis present

## 2015-06-13 DIAGNOSIS — I10 Essential (primary) hypertension: Secondary | ICD-10-CM | POA: Insufficient documentation

## 2015-06-13 DIAGNOSIS — Z79899 Other long term (current) drug therapy: Secondary | ICD-10-CM | POA: Insufficient documentation

## 2015-06-13 DIAGNOSIS — K219 Gastro-esophageal reflux disease without esophagitis: Secondary | ICD-10-CM | POA: Insufficient documentation

## 2015-06-13 DIAGNOSIS — L03012 Cellulitis of left finger: Secondary | ICD-10-CM | POA: Insufficient documentation

## 2015-06-13 HISTORY — DX: Gastro-esophageal reflux disease without esophagitis: K21.9

## 2015-06-13 MED ORDER — CEPHALEXIN 500 MG PO CAPS: 500.0000 mg | ORAL_CAPSULE | Freq: Two times a day (BID) | ORAL | Status: DC

## 2015-06-13 MED ORDER — IBUPROFEN 800 MG PO TABS
800.0000 mg | ORAL_TABLET | Freq: Once | ORAL | Status: AC
  Administered 2015-06-13: 800 mg via ORAL
  Filled 2015-06-13: qty 1

## 2015-06-13 MED ORDER — CEPHALEXIN 250 MG PO CAPS
500.0000 mg | ORAL_CAPSULE | Freq: Once | ORAL | Status: AC
Start: 2015-06-13 — End: 2015-06-13
  Administered 2015-06-13: 500 mg via ORAL
  Filled 2015-06-13: qty 2

## 2015-06-13 NOTE — ED Provider Notes (Signed)
CSN: [id number]     Arrival date & time 06/13/15  0208 History  This chart was scribed for Shawna Balls, MD by Evelene Croon, ED Scribe. This patient was seen in room D31C/D31C and the patient's care was started 3:08 AM.   Chief Complaint  Patient presents with  . Hand Pain    The history is provided by the patient. No language interpreter was used.   HPI Comments:  Shawna Thompson is a 69 y.o. female who presents to the Emergency Department complaining of left thumb pain and swelling that started a few days ago but worsened last night. She denies injury to the digit.  No alleviating factors or associated symptoms noted. Patient denies nail biting or removal of cuticles.  Past Medical History  Diagnosis Date  . Hypertension   . High cholesterol   . GERD (gastroesophageal reflux disease)    History reviewed. No pertinent past surgical history. No family history on file. Social History  Substance Use Topics  . Smoking status: Never Smoker   . Smokeless tobacco: None  . Alcohol Use: No   OB History    No data available     Review of Systems  A complete 10 system review of systems was obtained and all systems are negative except as noted in the HPI and PMH.    Allergies  Review of patient's allergies indicates no known allergies.  Home Medications   Prior to Admission medications   Medication Sig Start Date End Date Taking? Authorizing Provider  aspirin EC 81 MG tablet Take 81 mg by mouth daily.    Historical Provider, MD  benzonatate (TESSALON) 100 MG capsule Take 1 capsule (100 mg total) by mouth every 8 (eight) hours. 03/04/14   Julianne Rice, MD  cephALEXin (KEFLEX) 500 MG capsule Take 1 capsule (500 mg total) by mouth 3 (three) times daily. 11/01/14   Waldemar Dickens, MD  diclofenac (VOLTAREN) 75 MG EC tablet Take 1 tablet (75 mg total) by mouth 2 (two) times daily. 11/01/14   Waldemar Dickens, MD  esomeprazole (NEXIUM) 20 MG capsule Take 20 mg by mouth 2 (two) times daily  before a meal.    Historical Provider, MD  fluconazole (DIFLUCAN) 150 MG tablet Take 1 tablet (150 mg total) by mouth daily. Repeat dose in 3 days 11/01/14   Waldemar Dickens, MD  hydrochlorothiazide (HYDRODIURIL) 25 MG tablet Take 25 mg by mouth daily.    Historical Provider, MD  methocarbamol (ROBAXIN) 500 MG tablet Take 1-2 tablets (500-1,000 mg total) by mouth every 6 (six) hours as needed for muscle spasms. 11/01/14   Waldemar Dickens, MD  phenazopyridine (PYRIDIUM) 100 MG tablet Take 1-2 tablets (100-200 mg total) by mouth 3 (three) times daily as needed for pain. 11/01/14   Waldemar Dickens, MD  simvastatin (ZOCOR) 10 MG tablet Take 10 mg by mouth daily.    Historical Provider, MD   BP 187/96 mmHg  Pulse 113  Temp(Src) 98.6 F (37 C) (Oral)  Resp 16  Ht 5\' 1"  (1.549 m)  Wt 211 lb (95.709 kg)  BMI 39.89 kg/m2  SpO2 94% Physical Exam  Constitutional: She is oriented to person, place, and time. She appears well-developed and well-nourished. No distress.  HENT:  Head: Normocephalic and atraumatic.  Nose: Nose normal.  Mouth/Throat: Oropharynx is clear and moist. No oropharyngeal exudate.  Eyes: Conjunctivae and EOM are normal. Pupils are equal, round, and reactive to light. No scleral icterus.  Neck: Normal range  of motion. Neck supple. No JVD present. No tracheal deviation present. No thyromegaly present.  Cardiovascular: Normal rate, regular rhythm and normal heart sounds.  Exam reveals no gallop and no friction rub.   No murmur heard. Pulmonary/Chest: Effort normal and breath sounds normal. No respiratory distress. She has no wheezes. She exhibits no tenderness.  Abdominal: Soft. Bowel sounds are normal. She exhibits no distension and no mass. There is no tenderness. There is no rebound and no guarding.  Musculoskeletal: Normal range of motion. She exhibits tenderness. She exhibits no edema.  Left first digit with paronychia, thumb is tender and purulence noted on radial side    Lymphadenopathy:    She has no cervical adenopathy.  Neurological: She is alert and oriented to person, place, and time. No cranial nerve deficit. She exhibits normal muscle tone.  Skin: Skin is warm and dry. No rash noted. No erythema. No pallor.  Nursing note and vitals reviewed.   ED Course  Procedures   DIAGNOSTIC STUDIES:  Oxygen Saturation is 100% on RA, normal by my interpretation.    COORDINATION OF CARE:  3:09 AM Discussed treatment plan with pt at bedside and pt agreed to plan.  INCISION AND DRAINAGE PROCEDURE NOTE: Patient identification was confirmed and verbal consent was obtained. This procedure was performed by Shawna Balls, MD at 3:30 AM. Site: L thumb Sterile procedures observed Anesthetic used (type and amt): none Blade size: 11 Drainage: prulent Complexity: Simple Site anesthetized, incision made over site, wound drained and explored loculations, rinsed with copious amounts of normal saline, wound packed with sterile gauze, covered with dry, sterile dressing.  Pt tolerated procedure well without complications.  Instructions for care discussed verbally and pt provided with additional written instructions for homecare and f/u.  Labs Review Labs Reviewed - No data to display  Imaging Review No results found. I have personally reviewed and evaluated these images and lab results as part of my medical decision-making.   EKG Interpretation None      MDM   Final diagnoses:  None    Patient presents emergency department for a paronychia. This was drained at the bedside.  She tolerated the procedure well. Because she has mild surrounding saline is, will place on 3 days of Keflex for treatment. Patient is amenable to plan. Patient has been hypertensive since triage, she is asymptomatic from this. Her vital signs remain within her normal limits. She was given Motrin for pain control prior to discharge.  Tachycardia has resolved spontaneously after pain  medication was given.  I personally performed the services described in this documentation, which was scribed in my presence. The recorded information has been reviewed and is accurate.    Shawna Balls, MD 06/13/15 [phone]

## 2015-06-13 NOTE — ED Notes (Signed)
Pt. presents with left thumb paronychia onset 2 days ago  , swelling with no drainage , denies injury .

## 2015-06-13 NOTE — ED Notes (Signed)
Dr Claudine Mouton in to see patient to drain paronychia.  Patient tolerated procedure.  BP became elevated

## 2015-06-13 NOTE — ED Notes (Signed)
Discharge instructions reviewed, patient voiced understanding.   Prescription reviewed

## 2015-06-13 NOTE — Discharge Instructions (Signed)
Paronychia  Shawna Thompson, your infection was opened and the pus was drained. Take antibiotic for 3 days and follow-up with a primary care physician in 3 days for a wound check. If he cannot get an appointment come back to emergency department. Thank you. Paronychia is an infection of the skin caused by germs. It happens by the fingernail or toenail. You can avoid it by not:  Pulling on hangnails.  Nail biting.  Thumb sucking.  Cutting fingernails and toenails too short.  Cutting the skin at the base and sides of the fingernail or toenail (cuticle). HOME CARE  Keep the fingers or toes very dry. Put rubber gloves over cotton gloves when putting hands in water.  Keep the wound clean and bandaged (dressed) as told by your doctor.  Soak the fingers or toes in warm water for 15 to 20 minutes. Soak them 3 to 4 times per day for germ infections. Fungal infections are difficult to treat. Fungal infections often require treatment for a long time.  Only take medicine as told by your doctor. GET HELP RIGHT AWAY IF:   You have redness, puffiness (swelling), or pain that gets worse.  You see yellowish-white fluid (pus) coming from the wound.  You have a fever.  You have a bad smell coming from the wound or bandage. MAKE SURE YOU:  Understand these instructions.  Will watch your condition.  Will get help if you are not doing well or get worse. Document Released: 09/09/2009 Document Revised: 12/14/2011 Document Reviewed: 09/09/2009 [facility] Patient Information 2015 [place], [place]. This information is not intended to replace advice given to you by your health care provider. Make sure you discuss any questions you have with your health care provider.

## 2016-04-03 ENCOUNTER — Other Ambulatory Visit: Payer: Self-pay | Admitting: Internal Medicine

## 2016-04-03 DIAGNOSIS — Z1231 Encounter for screening mammogram for malignant neoplasm of breast: Secondary | ICD-10-CM

## 2016-04-15 ENCOUNTER — Ambulatory Visit
Admission: RE | Admit: 2016-04-15 | Discharge: 2016-04-15 | Disposition: A | Discharge status: Home / Self Care | Payer: Medicare HMO | Source: Ambulatory Visit | Attending: Internal Medicine | Admitting: Internal Medicine

## 2016-04-15 DIAGNOSIS — Z1231 Encounter for screening mammogram for malignant neoplasm of breast: Secondary | ICD-10-CM

## 2016-08-12 ENCOUNTER — Emergency Department (HOSPITAL_COMMUNITY): Payer: Medicare HMO

## 2016-08-12 ENCOUNTER — Encounter (HOSPITAL_COMMUNITY): Payer: Self-pay

## 2016-08-12 ENCOUNTER — Emergency Department (HOSPITAL_COMMUNITY)
Admission: EM | Admit: 2016-08-12 | Discharge: 2016-08-13 | Disposition: A | Discharge status: Home / Self Care | Payer: Medicare HMO | Attending: Emergency Medicine | Admitting: Emergency Medicine

## 2016-08-12 DIAGNOSIS — I10 Essential (primary) hypertension: Secondary | ICD-10-CM | POA: Diagnosis not present

## 2016-08-12 DIAGNOSIS — R791 Abnormal coagulation profile: Secondary | ICD-10-CM | POA: Diagnosis not present

## 2016-08-12 DIAGNOSIS — Z7982 Long term (current) use of aspirin: Secondary | ICD-10-CM | POA: Diagnosis not present

## 2016-08-12 DIAGNOSIS — R42 Dizziness and giddiness: Secondary | ICD-10-CM | POA: Diagnosis not present

## 2016-08-12 LAB — I-STAT CHEM 8, ED
BUN: 14 mg/dL (ref 6–20)
CALCIUM ION: 1.18 mmol/L (ref 1.15–1.40)
Chloride: 105 mmol/L (ref 101–111)
Creatinine, Ser: 0.9 mg/dL (ref 0.44–1.00)
Glucose, Bld: 94 mg/dL (ref 65–99)
HCT: 39 % (ref 36.0–46.0)
HEMOGLOBIN: 13.3 g/dL (ref 12.0–15.0)
Potassium: 4.1 mmol/L (ref 3.5–5.1)
SODIUM: 145 mmol/L (ref 135–145)
TCO2: 27 mmol/L (ref 0–100)

## 2016-08-12 LAB — DIFFERENTIAL
BASOS PCT: 0 %
Basophils Absolute: 0 10*3/uL (ref 0.0–0.1)
EOS ABS: 0.2 10*3/uL (ref 0.0–0.7)
EOS PCT: 2 %
LYMPHS ABS: 3.4 10*3/uL (ref 0.7–4.0)
Lymphocytes Relative: 37 %
MONO ABS: 0.4 10*3/uL (ref 0.1–1.0)
MONOS PCT: 5 %
Neutro Abs: 5.1 10*3/uL (ref 1.7–7.7)
Neutrophils Relative %: 56 %

## 2016-08-12 LAB — APTT: APTT: 30 s (ref 24–36)

## 2016-08-12 LAB — COMPREHENSIVE METABOLIC PANEL
ALT: 11 U/L — ABNORMAL LOW (ref 14–54)
ANION GAP: 8 (ref 5–15)
AST: 15 U/L (ref 15–41)
Albumin: 3.6 g/dL (ref 3.5–5.0)
Alkaline Phosphatase: 98 U/L (ref 38–126)
BILIRUBIN TOTAL: 0.6 mg/dL (ref 0.3–1.2)
BUN: 12 mg/dL (ref 6–20)
CHLORIDE: 108 mmol/L (ref 101–111)
CO2: 25 mmol/L (ref 22–32)
Calcium: 9.2 mg/dL (ref 8.9–10.3)
Creatinine, Ser: 0.95 mg/dL (ref 0.44–1.00)
Glucose, Bld: 97 mg/dL (ref 65–99)
POTASSIUM: 4 mmol/L (ref 3.5–5.1)
Sodium: 141 mmol/L (ref 135–145)
TOTAL PROTEIN: 6.8 g/dL (ref 6.5–8.1)

## 2016-08-12 LAB — CBC
HEMATOCRIT: 39.7 % (ref 36.0–46.0)
HEMOGLOBIN: 12.9 g/dL (ref 12.0–15.0)
MCH: 25.3 pg — AB (ref 26.0–34.0)
MCHC: 32.5 g/dL (ref 30.0–36.0)
MCV: 77.8 fL — ABNORMAL LOW (ref 78.0–100.0)
Platelets: 303 10*3/uL (ref 150–400)
RBC: 5.1 MIL/uL (ref 3.87–5.11)
RDW: 14.3 % (ref 11.5–15.5)
WBC: 9.2 10*3/uL (ref 4.0–10.5)

## 2016-08-12 LAB — I-STAT TROPONIN, ED: TROPONIN I, POC: 0 ng/mL (ref 0.00–0.08)

## 2016-08-12 LAB — CBG MONITORING, ED: Glucose-Capillary: 100 mg/dL — ABNORMAL HIGH (ref 65–99)

## 2016-08-12 LAB — PROTIME-INR
INR: 0.94
Prothrombin Time: 12.6 seconds (ref 11.4–15.2)

## 2016-08-12 MED ORDER — FLUTICASONE PROPIONATE 50 MCG/ACT NA SUSP: 1.0000 | Freq: Every day | NASAL | 2 refills | Status: DC

## 2016-08-12 NOTE — Discharge Instructions (Signed)
Continue using the meclizine as needed for dizziness symptoms.  Follow-up with your primary care doctor if not better in 4 or 5 days.

## 2016-08-12 NOTE — ED Triage Notes (Signed)
Pt also adds that she stated some R sided numbness and tingling in her hand today which has resolved

## 2016-08-12 NOTE — ED Provider Notes (Signed)
[facility] DEPT Provider Note   CSN: [id number] Arrival date & time: 08/12/16  1935     History   Chief Complaint Chief Complaint  Patient presents with  . Dizziness  . Nausea    HPI Shawna Thompson is a 70 y.o. female.  She reports intermittent dizziness sensation, aggravated by head movement, for 3 days. This is a recurrent problem. She is using meclizine with improvement, and overall feels better. She has had some pain in her left ear, and occasional nasal congestion. She denies fever, chills, nausea, vomiting, chest pain, weakness or dizziness. There's been no blurred vision, near syncope or syncope. There are no other known modifying factors.  HPI  Past Medical History:  Diagnosis Date  . GERD (gastroesophageal reflux disease)   . High cholesterol   . Hypertension     There are no active problems to display for this patient.   History reviewed. No pertinent surgical history.  OB History    No data available       Home Medications    Prior to Admission medications   Medication Sig Start Date End Date Taking? Authorizing Provider  aspirin EC 81 MG tablet Take 81 mg by mouth daily.   Yes Historical Provider, MD  esomeprazole (NEXIUM) 20 MG capsule Take 20 mg by mouth 2 (two) times daily before a meal.   Yes Historical Provider, MD  meclizine (ANTIVERT) 25 MG tablet Take 25 mg by mouth every 8 (eight) hours as needed for dizziness.   Yes Historical Provider, MD  benzonatate (TESSALON) 100 MG capsule Take 1 capsule (100 mg total) by mouth every 8 (eight) hours. Patient not taking: Reported on 08/12/2016 03/04/14   Julianne Rice, MD  cephALEXin (KEFLEX) 500 MG capsule Take 1 capsule (500 mg total) by mouth 2 (two) times daily. Patient not taking: Reported on 08/12/2016 06/13/15   [name], MD  diclofenac (VOLTAREN) 75 MG EC tablet Take 1 tablet (75 mg total) by mouth 2 (two) times daily. Patient not taking: Reported on 08/12/2016 11/01/14   Waldemar Dickens, MD    fluconazole (DIFLUCAN) 150 MG tablet Take 1 tablet (150 mg total) by mouth daily. Repeat dose in 3 days Patient not taking: Reported on 08/12/2016 11/01/14   Waldemar Dickens, MD  methocarbamol (ROBAXIN) 500 MG tablet Take 1-2 tablets (500-1,000 mg total) by mouth every 6 (six) hours as needed for muscle spasms. Patient not taking: Reported on 08/12/2016 11/01/14   Waldemar Dickens, MD  phenazopyridine (PYRIDIUM) 100 MG tablet Take 1-2 tablets (100-200 mg total) by mouth 3 (three) times daily as needed for pain. Patient not taking: Reported on 08/12/2016 11/01/14   Waldemar Dickens, MD    Family History No family history on file.  Social History Social History  Substance Use Topics  . Smoking status: Never Smoker  . Smokeless tobacco: Never Used  . Alcohol use No     Allergies   Patient has no known allergies.   Review of Systems Review of Systems  All other systems reviewed and are negative.    Physical Exam Updated Vital Signs BP 179/95   Pulse 80   Temp 97.8 F (36.6 C)   Resp 20   SpO2 99%   Physical Exam  Constitutional: She is oriented to person, place, and time. She appears well-developed and well-nourished. No distress.  HENT:  Head: Normocephalic and atraumatic.  Eyes: Conjunctivae and EOM are normal. Pupils are equal, round, and reactive to light.  Neck:  Normal range of motion and phonation normal. Neck supple.  Cardiovascular: Normal rate and regular rhythm.   Pulmonary/Chest: Effort normal and breath sounds normal. She exhibits no tenderness.  Abdominal: Soft. She exhibits no distension. There is no tenderness. There is no guarding.  Musculoskeletal: Normal range of motion.  Neurological: She is alert and oriented to person, place, and time. She exhibits normal muscle tone.  Mild bilateral nystagmus, with lateral excursion, without associated dysarthria or aphasia. Head impulse testing causes dizziness.  Skin: Skin is warm and dry.  Psychiatric: She has a  normal mood and affect. Her behavior is normal. Judgment and thought content normal.  Nursing note and vitals reviewed.    ED Treatments / Results  Labs (all labs ordered are listed, but only abnormal results are displayed) Labs Reviewed  CBC - Abnormal; Notable for the following:       Result Value   MCV 77.8 (*)    MCH 25.3 (*)    All other components within normal limits  COMPREHENSIVE METABOLIC PANEL - Abnormal; Notable for the following:    ALT 11 (*)    All other components within normal limits  PROTIME-INR  APTT  DIFFERENTIAL  I-STAT TROPOININ, ED  I-STAT CHEM 8, ED  CBG MONITORING, ED    EKG  EKG Interpretation  Date/Time:  Wednesday August 12 2016 21:31:39 EST Ventricular Rate:  79 PR Interval:    QRS Duration: 90 QT Interval:  375 QTC Calculation: 430 R Axis:   33 Text Interpretation:  Sinus rhythm Low voltage, precordial leads since last tracing no significant change Confirmed by Eulis Foster  MD, Aneita Kiger [phone]) on 08/12/2016 9:43:56 PM       Radiology Ct Head Wo Contrast  Result Date: 08/12/2016 CLINICAL DATA:  Dizzy with nausea EXAM: CT HEAD WITHOUT CONTRAST TECHNIQUE: Contiguous axial images were obtained from the base of the skull through the vertex without intravenous contrast. COMPARISON:  None. FINDINGS: Brain: No evidence of acute infarction, hemorrhage, hydrocephalus, extra-axial collection or mass lesion/mass effect. Vascular: No hyperdense vessel.  Carotid artery calcifications. Skull: Normal. Negative for fracture or focal lesion. Sinuses/Orbits: Mucosal thickening in the ethmoid, sphenoid and maxillary sinuses. No acute orbital abnormality. Other: None IMPRESSION: No CT evidence for acute intracranial abnormality. Electronically Signed   By: Donavan Foil M.D.   On: 08/12/2016 20:18    Procedures Procedures (including critical care time)  Medications Ordered in ED Medications - No data to display   Initial Impression / Assessment and Plan / ED  Course  I have reviewed the triage vital signs and the nursing notes.  Pertinent labs & imaging results that were available during my care of the patient were reviewed by me and considered in my medical decision making (see chart for details).  Clinical Course     Medications - No data to display  Patient Vitals for the past 24 hrs:  BP Temp Pulse Resp SpO2  08/12/16 2132 - - 80 20 99 %  08/12/16 2100 179/95 - 73 12 97 %  08/12/16 1941 164/95 97.8 F (36.6 C) 92 20 95 %    9:51 PM Reevaluation with update and discussion. After initial assessment and treatment, an updated evaluation reveals No change in clinical status. Findings discussed with patient, all questions were answered. Jaquese Irving L    Final Clinical Impressions(s) / ED Diagnoses   Final diagnoses:  Vertigo    Evaluation consistent with peripheral vertigo. Doubt CNS abnormality, ACS, PE or metabolic instability. Possible seasonal allergy versus  noninfectious sinusitis.  Nursing Notes Reviewed/ Care Coordinated Applicable Imaging Reviewed Interpretation of Laboratory Data incorporated into ED treatment  The patient appears reasonably screened and/or stabilized for discharge and I doubt any other medical condition or other [facility] requiring further screening, evaluation, or treatment in the ED at this time prior to discharge.  Plan: Home Medications- Continue Meclizine; Home Treatments- rest, fluids; return here if the recommended treatment, does not improve the symptoms; Recommended follow up- PCP prn   New Prescriptions New Prescriptions   No medications on file     Daleen Bo, MD 08/12/16 2230

## 2016-08-12 NOTE — ED Notes (Signed)
Pt verbalized understanding discharge instructions and denies any further needs or questions at this time. VS stable, ambulatory and steady gait.   

## 2016-08-12 NOTE — ED Triage Notes (Signed)
Pt states that since Monday she has been having dizziness and nausea. Dizziness is room spinning dizzy, pt has a hx of vertigo. The nausea comes along with the dizziness, and dizziness is relieved when lying down.

## 2017-07-09 ENCOUNTER — Other Ambulatory Visit: Payer: Self-pay | Admitting: Internal Medicine

## 2017-07-09 DIAGNOSIS — Z1231 Encounter for screening mammogram for malignant neoplasm of breast: Secondary | ICD-10-CM

## 2017-07-28 ENCOUNTER — Ambulatory Visit: Payer: Medicare HMO

## 2017-08-16 ENCOUNTER — Ambulatory Visit: Payer: Medicare HMO

## 2017-10-14 ENCOUNTER — Other Ambulatory Visit (INDEPENDENT_AMBULATORY_CARE_PROVIDER_SITE_OTHER): Payer: Self-pay | Admitting: Orthopaedic Surgery

## 2017-10-14 ENCOUNTER — Ambulatory Visit (INDEPENDENT_AMBULATORY_CARE_PROVIDER_SITE_OTHER): Payer: Medicare HMO

## 2017-10-14 ENCOUNTER — Encounter (INDEPENDENT_AMBULATORY_CARE_PROVIDER_SITE_OTHER): Payer: Self-pay | Admitting: Orthopaedic Surgery

## 2017-10-14 ENCOUNTER — Ambulatory Visit (INDEPENDENT_AMBULATORY_CARE_PROVIDER_SITE_OTHER): Payer: Medicare HMO | Admitting: Orthopaedic Surgery

## 2017-10-14 DIAGNOSIS — M1711 Unilateral primary osteoarthritis, right knee: Secondary | ICD-10-CM | POA: Diagnosis not present

## 2017-10-14 MED ORDER — METHYLPREDNISOLONE ACETATE 40 MG/ML IJ SUSP
40.0000 mg | INTRAMUSCULAR | Status: AC | PRN
Start: 2017-10-14 — End: 2017-10-14
  Administered 2017-10-14: 40 mg via INTRA_ARTICULAR

## 2017-10-14 MED ORDER — BUPIVACAINE HCL 0.5 % IJ SOLN
2.0000 mL | INTRAMUSCULAR | Status: AC | PRN
  Administered 2017-10-14: 2 mL via INTRA_ARTICULAR

## 2017-10-14 MED ORDER — DICLOFENAC SODIUM 1 % TD GEL: 2.0000 g | Freq: Four times a day (QID) | TRANSDERMAL | 5 refills | Status: DC

## 2017-10-14 MED ORDER — LIDOCAINE HCL 1 % IJ SOLN
2.0000 mL | INTRAMUSCULAR | Status: AC | PRN
  Administered 2017-10-14: 2 mL

## 2017-10-14 MED ORDER — MELOXICAM 7.5 MG PO TABS
7.5000 mg | ORAL_TABLET | Freq: Two times a day (BID) | ORAL | 2 refills | Status: DC | PRN
Start: 2017-10-14 — End: 2017-10-14

## 2017-10-14 NOTE — Progress Notes (Signed)
Office Visit Note   Patient: Shawna Thompson           Date of Birth: 17-Oct-1945           MRN: [id number] Visit Date: 10/14/2017              Requested by: No referring provider defined for this encounter. PCP: Nolene Ebbs, MD   Assessment & Plan: Visit Diagnoses:  1. Primary osteoarthritis of right knee     Plan: Impression is 72 year old female with right knee arthritis and reactive effusion.  30 cc of blood-tinged joint fluid was aspirated from the knee and then injected with cortisone.  Patient tolerated well.  We will notify the patient of the results.  Prescription for Mobic and Voltaren gel.  Follow-Up Instructions: Return if symptoms worsen or fail to improve.   Orders:  Orders Placed This Encounter  Procedures  . XR KNEE 3 VIEW RIGHT  . Cell count + diff,  w/ cryst-synvl fld   Meds ordered this encounter  Medications  . diclofenac sodium (VOLTAREN) 1 % GEL    Sig: Apply 2 g topically 4 (four) times daily.    Dispense:  1 Tube    Refill:  5  . meloxicam (MOBIC) 7.5 MG tablet    Sig: Take 1 tablet (7.5 mg total) by mouth 2 (two) times daily as needed for pain.    Dispense:  30 tablet    Refill:  2      Procedures: Large Joint Inj: R knee on 10/14/2017 11:31 AM Indications: pain Details: 22 G needle  Arthrogram: No  Medications: 40 mg methylPREDNISolone acetate 40 MG/ML; 2 mL lidocaine 1 %; 2 mL bupivacaine 0.5 % Aspirate: 30 mL blood-tinged Consent was given by the patient. Patient was prepped and draped in the usual sterile fashion.       Clinical Data: No additional findings.   Subjective: Chief Complaint  Patient presents with  . Right Knee - Pain    Patient is a 72 year old female comes in with right knee pain and swelling for several months.  She denies any injuries.  This is progressively gotten worse.  She endorses stiffness and occasional popping.  She has taken over-the-counter medicines without any significant relief.  Pain does not  radiate.  Denies any back pain.    Review of Systems  Constitutional: Negative.   HENT: Negative.   Eyes: Negative.   Respiratory: Negative.   Cardiovascular: Negative.   Endocrine: Negative.   Musculoskeletal: Negative.   Neurological: Negative.   Hematological: Negative.   Psychiatric/Behavioral: Negative.   All other systems reviewed and are negative.    Objective: Vital Signs: There were no vitals taken for this visit.  Physical Exam  Constitutional: She is oriented to person, place, and time. She appears well-developed and well-nourished.  HENT:  Head: Normocephalic and atraumatic.  Eyes: EOM are normal.  Neck: Neck supple.  Pulmonary/Chest: Effort normal.  Abdominal: Soft.  Neurological: She is alert and oriented to person, place, and time.  Skin: Skin is warm. Capillary refill takes less than 2 seconds.  Psychiatric: She has a normal mood and affect. Her behavior is normal. Judgment and thought content normal.  Nursing note and vitals reviewed.   Ortho Exam Right knee exam shows a moderate joint effusion.  Collaterals and cruciates are stable.  No warmth or skin changes. Specialty Comments:  No specialty comments available.  Imaging: Xr Knee 3 View Right  Result Date: 10/14/2017 Mild osteoarthritis without significant  joint space narrowing    PMFS History: There are no active problems to display for this patient.  Past Medical History:  Diagnosis Date  . GERD (gastroesophageal reflux disease)   . High cholesterol   . Hypertension     History reviewed. No pertinent family history.  History reviewed. No pertinent surgical history. Social History   Occupational History  . Not on file  Tobacco Use  . Smoking status: Never Smoker  . Smokeless tobacco: Never Used  Substance and Sexual Activity  . Alcohol use: No  . Drug use: Not on file  . Sexual activity: Not on file

## 2017-10-15 LAB — SYNOVIAL CELL COUNT + DIFF, W/ CRYSTALS
BASOPHILS, %: 0 %
EOSINOPHILS-SYNOVIAL: 1 % (ref 0–2)
LYMPHOCYTES-SYNOVIAL FLD: 37 % (ref 0–74)
Monocyte/Macrophage: 22 % (ref 0–69)
NEUTROPHIL, SYNOVIAL: 36 % — AB (ref 0–24)
SYNOVIOCYTES, %: 4 % (ref 0–15)
WBC, Synovial: 153 cells/uL — ABNORMAL HIGH (ref <150)

## 2017-10-15 LAB — TIQ-NTM

## 2017-10-15 NOTE — Progress Notes (Signed)
Just inflammatory fluid.  Arthritis flare up.  Nothing abnormal otherwise.

## 2017-11-26 ENCOUNTER — Other Ambulatory Visit (INDEPENDENT_AMBULATORY_CARE_PROVIDER_SITE_OTHER): Payer: Self-pay | Admitting: Orthopaedic Surgery

## 2017-12-24 ENCOUNTER — Other Ambulatory Visit: Payer: Self-pay | Admitting: Internal Medicine

## 2017-12-24 DIAGNOSIS — Z1231 Encounter for screening mammogram for malignant neoplasm of breast: Secondary | ICD-10-CM

## 2018-01-14 ENCOUNTER — Ambulatory Visit
Admission: RE | Admit: 2018-01-14 | Discharge: 2018-01-14 | Disposition: A | Discharge status: Home / Self Care | Payer: Medicare HMO | Source: Ambulatory Visit | Attending: Internal Medicine | Admitting: Internal Medicine

## 2018-01-14 DIAGNOSIS — Z1231 Encounter for screening mammogram for malignant neoplasm of breast: Secondary | ICD-10-CM

## 2018-01-26 ENCOUNTER — Other Ambulatory Visit (INDEPENDENT_AMBULATORY_CARE_PROVIDER_SITE_OTHER): Payer: Self-pay | Admitting: Orthopaedic Surgery

## 2018-01-27 ENCOUNTER — Other Ambulatory Visit (INDEPENDENT_AMBULATORY_CARE_PROVIDER_SITE_OTHER): Payer: Self-pay

## 2018-01-27 MED ORDER — MELOXICAM 7.5 MG PO TABS: ORAL_TABLET | ORAL | 0 refills | Status: DC

## 2018-01-27 NOTE — Telephone Encounter (Signed)
RX faxed to pharm  

## 2018-01-27 NOTE — Telephone Encounter (Signed)
Ok to refill, but please change to once daily instead of bid

## 2018-02-16 ENCOUNTER — Emergency Department (HOSPITAL_COMMUNITY)
Admission: EM | Admit: 2018-02-16 | Discharge: 2018-02-17 | Disposition: A | Discharge status: Home / Self Care | Payer: Medicare HMO | Attending: Emergency Medicine | Admitting: Emergency Medicine

## 2018-02-16 ENCOUNTER — Other Ambulatory Visit: Payer: Self-pay

## 2018-02-16 ENCOUNTER — Encounter (HOSPITAL_COMMUNITY): Payer: Self-pay | Admitting: Emergency Medicine

## 2018-02-16 ENCOUNTER — Emergency Department (HOSPITAL_COMMUNITY): Payer: Medicare HMO

## 2018-02-16 DIAGNOSIS — F17210 Nicotine dependence, cigarettes, uncomplicated: Secondary | ICD-10-CM | POA: Insufficient documentation

## 2018-02-16 DIAGNOSIS — I10 Essential (primary) hypertension: Secondary | ICD-10-CM

## 2018-02-16 DIAGNOSIS — Z79899 Other long term (current) drug therapy: Secondary | ICD-10-CM | POA: Diagnosis not present

## 2018-02-16 DIAGNOSIS — R079 Chest pain, unspecified: Secondary | ICD-10-CM | POA: Diagnosis present

## 2018-02-16 LAB — I-STAT TROPONIN, ED
Troponin i, poc: 0.01 ng/mL (ref 0.00–0.08)
Troponin i, poc: 0.01 ng/mL (ref 0.00–0.08)

## 2018-02-16 LAB — CBC
HCT: 38.8 % (ref 36.0–46.0)
Hemoglobin: 12 g/dL (ref 12.0–15.0)
MCH: 24.5 pg — ABNORMAL LOW (ref 26.0–34.0)
MCHC: 30.9 g/dL (ref 30.0–36.0)
MCV: 79.3 fL (ref 78.0–100.0)
PLATELETS: 269 10*3/uL (ref 150–400)
RBC: 4.89 MIL/uL (ref 3.87–5.11)
RDW: 14.6 % (ref 11.5–15.5)
WBC: 8.2 10*3/uL (ref 4.0–10.5)

## 2018-02-16 LAB — BASIC METABOLIC PANEL
Anion gap: 7 (ref 5–15)
BUN: 15 mg/dL (ref 6–20)
CHLORIDE: 110 mmol/L (ref 101–111)
CO2: 28 mmol/L (ref 22–32)
CREATININE: 0.89 mg/dL (ref 0.44–1.00)
Calcium: 8.7 mg/dL — ABNORMAL LOW (ref 8.9–10.3)
GFR calc non Af Amer: >60 mL/min (ref >60)
Glucose, Bld: 117 mg/dL — ABNORMAL HIGH (ref 65–99)
POTASSIUM: 4.2 mmol/L (ref 3.5–5.1)
Sodium: 145 mmol/L (ref 135–145)

## 2018-02-16 LAB — I-STAT CG4 LACTIC ACID, ED: Lactic Acid, Venous: 1.3 mmol/L (ref 0.5–1.9)

## 2018-02-16 NOTE — ED Triage Notes (Signed)
Patient to ED c/o upper chest pain since Saturday with intermittent lightheadedness and SOB. Patient reports her BP has been high as well - saw PCP Monday and had her lisinopril 20mg  daily increased to two times a day. Resp e/u, skin warm/dry.

## 2018-02-17 ENCOUNTER — Encounter (HOSPITAL_COMMUNITY): Payer: Self-pay | Admitting: Emergency Medicine

## 2018-02-17 MED ORDER — HYDROCHLOROTHIAZIDE 25 MG PO TABS
25.0000 mg | ORAL_TABLET | Freq: Once | ORAL | Status: AC
  Administered 2018-02-17: 25 mg via ORAL
  Filled 2018-02-17: qty 1

## 2018-02-17 NOTE — ED Provider Notes (Addendum)
[facility] Provider Note   CSN: [id number] Arrival date & time: 02/16/18  1544     History   Chief Complaint Chief Complaint  Patient presents with  . Chest Pain    HPI Shawna Thompson is a 72 y.o. female.  The history is provided by the patient.  Hypertension  This is a chronic problem. The current episode started more than 1 week ago. The problem occurs constantly. The problem has been gradually worsening (patient reports since Saturday with HA and chest pain at rest and dizziness). Associated symptoms include chest pain and headaches. Pertinent negatives include no abdominal pain and no shortness of breath. Nothing aggravates the symptoms. Nothing relieves the symptoms. Treatments tried: increased dose of medication. The treatment provided no relief.  States he BP is usually normal, but BP meds were increased by PMD last week to BID in the office.  On Saturday had a headache and pain and dizziness and HA on Saturday.  BP has not improved and patient wants to know why it is not normal.  No DOE, no exertional CP.  No weakness no numbness no changes in vision or speech.    Past Medical History:  Diagnosis Date  . GERD (gastroesophageal reflux disease)   . High cholesterol   . Hypertension     There are no active problems to display for this patient.   History reviewed. No pertinent surgical history.   OB History   None      Home Medications    Prior to Admission medications   Medication Sig Start Date End Date Taking? Authorizing Provider  aspirin EC 81 MG tablet Take 81 mg by mouth daily.   Yes [provider]  esomeprazole (NEXIUM) 20 MG capsule Take 20 mg by mouth 2 (two) times daily before a meal.   Yes [provider]  lisinopril (PRINIVIL,ZESTRIL) 20 MG tablet Take 20 mg by mouth 2 (two) times daily. 02/14/18  Yes [provider]  meclizine (ANTIVERT) 25 MG tablet Take 25 mg by mouth every 8  (eight) hours as needed for dizziness.   Yes [provider]  meloxicam (MOBIC) 7.5 MG tablet TAKE 1 TABLET(7.5 MG) BY MOUTH TWICE DAILY AS NEEDED FOR PAIN 10/14/17  Yes Leandrew Koyanagi, MD  oxybutynin (DITROPAN) 5 MG tablet Take 5 mg by mouth every evening. 02/14/18  Yes [provider]  benzonatate (TESSALON) 100 MG capsule Take 1 capsule (100 mg total) by mouth every 8 (eight) hours. Patient not taking: Reported on 02/16/2018 03/04/14   Julianne Rice, MD  diclofenac (VOLTAREN) 75 MG EC tablet Take 1 tablet (75 mg total) by mouth 2 (two) times daily. Patient not taking: Reported on 08/12/2016 11/01/14   Waldemar Dickens, MD  diclofenac sodium (VOLTAREN) 1 % GEL Apply 2 g topically 4 (four) times daily. Patient not taking: Reported on 02/16/2018 10/14/17   Leandrew Koyanagi, MD  fluticasone [facility]) 50 MCG/ACT nasal spray Place 1 spray into both nostrils daily. Patient not taking: Reported on 02/16/2018 08/12/16   Daleen Bo, MD  meloxicam (MOBIC) 7.5 MG tablet TAKE 1 TAB PO DAILY PRN PAIN Patient not taking: Reported on 02/16/2018 01/27/18   Aundra Dubin, PA-C  methocarbamol (ROBAXIN) 500 MG tablet Take 1-2 tablets (500-1,000 mg total) by mouth every 6 (six) hours as needed for muscle spasms. Patient not taking: Reported on 02/16/2018 11/01/14   Waldemar Dickens, MD  phenazopyridine (PYRIDIUM) 100 MG tablet Take 1-2 tablets (  100-200 mg total) by mouth 3 (three) times daily as needed for pain. Patient not taking: Reported on 02/16/2018 11/01/14   Waldemar Dickens, MD    Family History No family history on file.  Social History Social History   Tobacco Use  . Smoking status: Never Smoker  . Smokeless tobacco: Never Used  Substance Use Topics  . Alcohol use: No  . Drug use: Not on file     Allergies   Patient has no known allergies.   Review of Systems Review of Systems  Constitutional: Negative for diaphoresis, fatigue and fever.  Respiratory: Negative for chest  tightness and shortness of breath.   Cardiovascular: Positive for chest pain. Negative for palpitations, leg swelling and near-syncope.  Gastrointestinal: Negative for abdominal pain, nausea and vomiting.  Neurological: Positive for dizziness and headaches. Negative for tremors, seizures, syncope, facial asymmetry, speech difficulty, weakness and numbness.  All other systems reviewed and are negative.    Physical Exam Updated Vital Signs BP (!) 172/70   Pulse (!) 55   Temp 97.9 F (36.6 C) (Oral)   Resp 13   SpO2 98%   Physical Exam  Constitutional: She is oriented to person, place, and time. She appears well-developed and well-nourished. No distress.  HENT:  Head: Normocephalic and atraumatic.  Mouth/Throat: No oropharyngeal exudate.  Eyes: Pupils are equal, round, and reactive to light. Conjunctivae are normal.  Neck: Normal range of motion. Neck supple. No JVD present.  Cardiovascular: Normal rate, regular rhythm, normal heart sounds and intact distal pulses.  Pulmonary/Chest: Effort normal and breath sounds normal. No stridor. She has no wheezes. She has no rales.  Abdominal: Soft. Bowel sounds are normal. She exhibits no mass. There is no tenderness. There is no rebound and no guarding.  Musculoskeletal: Normal range of motion. She exhibits no edema.  Neurological: She is alert and oriented to person, place, and time. She displays normal reflexes. No cranial nerve deficit.  Skin: Skin is warm and dry. Capillary refill takes less than 2 seconds.  Psychiatric: She has a normal mood and affect.     ED Treatments / Results  Labs (all labs ordered are listed, but only abnormal results are displayed) Labs Reviewed  BASIC METABOLIC PANEL - Abnormal; Notable for the following components:      Result Value   Glucose, Bld 117 (*)    Calcium 8.7 (*)    All other components within normal limits  CBC - Abnormal; Notable for the following components:   MCH 24.5 (*)    All other  components within normal limits  I-STAT TROPONIN, ED  I-STAT CG4 LACTIC ACID, ED  I-STAT TROPONIN, ED    EKG EKG Interpretation  Date/Time:  Wednesday Feb 16 2018 16:02:23 EDT Ventricular Rate:  61 PR Interval:  176 QRS Duration: 80 QT Interval:  400 QTC Calculation: 402 R Axis:   25 Text Interpretation:  Normal sinus rhythm Confirmed by Dory Horn) on 02/16/2018 11:29:23 PM   Radiology Dg Chest 2 View  Result Date: 02/16/2018 CLINICAL DATA:  Chest pain and nausea radiating to head, shortness of breath. EXAM: CHEST - 2 VIEW COMPARISON:  Chest radiograph Mar 03, 2014 FINDINGS: Cardiomediastinal silhouette is normal. Mildly calcified aortic arch. No pleural effusions or focal consolidations. Trachea projects midline and there is no pneumothorax. Soft tissue planes and included osseous structures are non-suspicious. Surgical clips in the included right abdomen compatible with cholecystectomy. Mild degenerative change of the thoracic spine. IMPRESSION: No acute cardiopulmonary process.  Aortic Atherosclerosis (ICD10-I70.0). Electronically Signed   By: Elon Alas M.D.   On: 02/16/2018 17:28    Procedures Procedures (including critical care time)  Medications Ordered in ED Medications  hydrochlorothiazide (HYDRODIURIL) tablet 25 mg (25 mg Oral Given 02/17/18 0013)      Final Clinical Impressions(s) / ED Diagnoses   Had a long discussion with patient (with nurse present) regarding dangers or acutely making BP 401 systolic which is what the patient wants.  I explained that she could have a stroke or pass out and injure herself from such an acute change.  Moreover, her BP has clearly been trending up prior to Saturday since her PMD had already added a second dose of BP medication.  I explained in detail that all labs, imaging and EKG were normal and reassuring and EDP apologized for the patient's frustration that she does not have an answer that suits her regarding her BP.   EDP reassured the patient that all life threatening conditions had been excluded and we have been monitoring her in the ED.  EDP explained that the patient would need to follow up with her PMD for ongoing testing and treatment of her BP.    Return for weakness, numbness, changes in vision or speech, fevers >100.4 unrelieved by medication, shortness of breath, intractable vomiting, or diarrhea, abdominal pain, Inability to tolerate liquids or food, cough, altered mental status or any concerns. No signs of systemic illness or infection. The patient is nontoxic-appearing on exam and vital signs are within normal limits.   I have reviewed the triage vital signs and the nursing notes. Pertinent labs &imaging results that were available during my care of the patient were reviewed by me and considered in my medical decision making (see chart for details).  After history, exam, and medical workup I feel the patient has been appropriately medically screened and is safe for discharge home. Pertinent diagnoses were discussed with the patient. Patient was given return precautions.    Demitri Kucinski, MD 02/17/18 [place], Tremell Reimers, MD 02/17/18 0272

## 2018-02-17 NOTE — ED Notes (Signed)
Pt reports upper mid chest pressure, headache, and HTN since Saturday. Pt reports she has not been able to get any relief with OTC meds.

## 2018-02-17 NOTE — ED Notes (Signed)
MD at bedside speaking with pt. Pt wants definitive answers as to why her blood pressure was increased. MD explained to the pt that all blood work was reassuring. MD also explained to the pt that her heart enzymes and EKG were normal as well. MD explained that if anything had occurred the blood work would have revealed it. Pt was appreciative of all the time and explaining the MD did for her. Pt appeared to be satisfied with the explanation of the Emergency Department's findings. Pt thanked the MD for her time in explaining everything to her.

## 2018-06-09 ENCOUNTER — Ambulatory Visit (INDEPENDENT_AMBULATORY_CARE_PROVIDER_SITE_OTHER): Payer: Medicare HMO | Admitting: Surgery

## 2018-06-09 ENCOUNTER — Encounter (INDEPENDENT_AMBULATORY_CARE_PROVIDER_SITE_OTHER): Payer: Self-pay | Admitting: Surgery

## 2018-06-09 DIAGNOSIS — M25561 Pain in right knee: Secondary | ICD-10-CM

## 2018-06-09 DIAGNOSIS — G8929 Other chronic pain: Secondary | ICD-10-CM | POA: Diagnosis not present

## 2018-06-09 MED ORDER — LIDOCAINE HCL 1 % IJ SOLN
3.0000 mL | INTRAMUSCULAR | Status: AC | PRN
  Administered 2018-06-09: 3 mL

## 2018-06-09 MED ORDER — BUPIVACAINE HCL 0.25 % IJ SOLN
6.0000 mL | INTRAMUSCULAR | Status: AC | PRN
  Administered 2018-06-09: 6 mL via INTRA_ARTICULAR

## 2018-06-09 MED ORDER — METHYLPREDNISOLONE ACETATE 40 MG/ML IJ SUSP
80.0000 mg | INTRAMUSCULAR | Status: AC | PRN
  Administered 2018-06-09: 80 mg

## 2018-06-09 NOTE — Progress Notes (Signed)
Office Visit Note   Patient: [facility] Thompson           Date of Birth: Feb 02, 1946           MRN: [id number] Visit Date: 06/09/2018              Requested by: Nolene Ebbs, MD [address] [place], [place] [address] PCP: Nolene Ebbs, MD   Assessment & Plan: Visit Diagnoses:  1. Chronic pain of right knee     Plan: In hopes of giving patient some relief I did offer aspiration and injection.  After patient consent right knee was prepped with Betadine and I aspirated about 15 cc of serous fluid.  Then injected Marcaine/Depo-Medrol from a superior lateral patellar approach.  After sitting for about 10 minutes patient reported excellent relief of her knee pain with Marcaine in place.  Was able to walk out of the office without difficulty.  I will order MRI right knee to rule out meniscal tear.  Since she also describes having an acute flare of her pain blood work was drawn today to check a CBC and arthritis panel.  She will follow with Dr Erlinda Hong  Follow-Up Instructions: Return in about 2 weeks (around 06/23/2018) for Dr.Xu to review right knee MRI and labs.   Orders:  Orders Placed This Encounter  Procedures  . Large Joint Inj  . MR Knee Right w/o contrast  . CBC  . Antinuclear Antib (ANA)  . Uric acid  . Rheumatoid Factor  . Sed Rate (ESR)   No orders of the defined types were placed in this encounter.     Procedures: Large Joint Inj: R knee on 06/09/2018 4:06 PM Indications: pain and joint swelling Details: 22 G 1.5 in needle, superolateral approach Medications: 6 mL bupivacaine 0.25 %; 3 mL lidocaine 1 %; 80 mg methylPREDNISolone acetate 40 MG/ML Aspirate: serous Outcome: tolerated well, no immediate complications  Aspirated about 15 cc of serous fluid.  Patient had excellent relief of her knee pain after sitting for about 10 minutes with Marcaine in place. Consent was given by the patient. Patient was prepped and draped in the usual sterile fashion.       Clinical  Data: No additional findings.   Subjective: Chief Complaint  Patient presents with  . Right Knee - Pain    HPI 72-year-old black female returns with complaints of right knee pain mechanical symptoms.  Patient seen and were 2019 by Dr. Erlinda Hong for right knee pain and swelling.  Knee was aspirated and intra-articular Marcaine/Depo-Medrol injection performed.  Patient states that she had fairly good response but did continue to have some intermittent pain over the last several months.  Symptoms gradually got worse over the last few weeks.  Describing medial knee pain with some feeling mechanical symptoms and also posterior knee pain and tightness.  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  No specific injury.  No recent travel. Review of Systems No current cardiac pulmonary GI GU issues  Objective: Vital Signs: There were no vitals taken for this visit.  Physical Exam  Constitutional: She is oriented to person, place, and time. No distress.  HENT:  Head: Normocephalic and atraumatic.  Eyes: Pupils are equal, round, and reactive to light. EOM are normal.  Pulmonary/Chest: No respiratory distress.  Musculoskeletal:  Gait is considerably antalgic.  Having difficulty standing and pivoting on right foot.  Using a cane.  Right knee does have some swelling.  No large effusion.  Medial greater than lateral joint line tenderness.  Positive McMurray's test.  Cruciate and collateral ligaments are stable.  Swelling posterior knee.  Question tender Baker's cyst.  Calf nontender.  Neurovascular intact.  Neurological: She is alert and oriented to person, place, and time.  Skin: Skin is warm and dry.    Ortho Exam  Specialty Comments:  No specialty comments available.  Imaging: No results found.   PMFS History: There are no active problems to display for this patient.  Past Medical History:  Diagnosis Date  . GERD (gastroesophageal reflux disease)     . High cholesterol   . Hypertension     History reviewed. No pertinent family history.  History reviewed. No pertinent surgical history. Social History   Occupational History  . Not on file  Tobacco Use  . Smoking status: Never Smoker  . Smokeless tobacco: Never Used  Substance and Sexual Activity  . Alcohol use: No  . Drug use: Not on file  . Sexual activity: Not on file

## 2018-06-12 LAB — CBC
HCT: 39 % (ref 35.0–45.0)
HEMOGLOBIN: 12.7 g/dL (ref 11.7–15.5)
MCH: 25.2 pg — ABNORMAL LOW (ref 27.0–33.0)
MCHC: 32.6 g/dL (ref 32.0–36.0)
MCV: 77.5 fL — ABNORMAL LOW (ref 80.0–100.0)
MPV: 9.6 fL (ref 7.5–12.5)
Platelets: 296 10*3/uL (ref 140–400)
RBC: 5.03 10*6/uL (ref 3.80–5.10)
RDW: 15.2 % — ABNORMAL HIGH (ref 11.0–15.0)
WBC: 9.2 10*3/uL (ref 3.8–10.8)

## 2018-06-12 LAB — SEDIMENTATION RATE: SED RATE: 6 mm/h (ref 0–30)

## 2018-06-12 LAB — RHEUMATOID FACTOR: Rhuematoid fact SerPl-aCnc: <14 IU/mL (ref <14)

## 2018-06-12 LAB — ANTI-NUCLEAR AB-TITER (ANA TITER): ANA Titer 1: 1:80 {titer} — ABNORMAL HIGH

## 2018-06-12 LAB — ANA: Anti Nuclear Antibody(ANA): POSITIVE — AB

## 2018-06-12 LAB — URIC ACID: Uric Acid, Serum: 7.2 mg/dL — ABNORMAL HIGH (ref 2.5–7.0)

## 2018-06-13 ENCOUNTER — Ambulatory Visit
Admission: RE | Admit: 2018-06-13 | Discharge: 2018-06-13 | Disposition: A | Discharge status: Home / Self Care | Payer: Medicare HMO | Source: Ambulatory Visit | Attending: Surgery | Admitting: Surgery

## 2018-06-13 ENCOUNTER — Other Ambulatory Visit: Payer: Medicare HMO

## 2018-06-13 DIAGNOSIS — G8929 Other chronic pain: Secondary | ICD-10-CM

## 2018-06-13 DIAGNOSIS — M25561 Pain in right knee: Principal | ICD-10-CM

## 2018-06-14 ENCOUNTER — Encounter (INDEPENDENT_AMBULATORY_CARE_PROVIDER_SITE_OTHER): Payer: Self-pay | Admitting: Orthopaedic Surgery

## 2018-06-14 ENCOUNTER — Ambulatory Visit (INDEPENDENT_AMBULATORY_CARE_PROVIDER_SITE_OTHER): Payer: Medicare HMO | Admitting: Orthopaedic Surgery

## 2018-06-14 DIAGNOSIS — S83241A Other tear of medial meniscus, current injury, right knee, initial encounter: Secondary | ICD-10-CM | POA: Diagnosis not present

## 2018-06-14 DIAGNOSIS — G8929 Other chronic pain: Secondary | ICD-10-CM | POA: Insufficient documentation

## 2018-06-14 DIAGNOSIS — R768 Other specified abnormal immunological findings in serum: Secondary | ICD-10-CM | POA: Insufficient documentation

## 2018-06-14 DIAGNOSIS — M25561 Pain in right knee: Secondary | ICD-10-CM

## 2018-06-14 NOTE — Progress Notes (Signed)
Office Visit Note   Patient: [facility] Thompson           Date of Birth: 1945-10-17           MRN: [id number] Visit Date: 06/14/2018              Requested by: Nolene Ebbs, MD [address] [place], [place] [address] PCP: Nolene Ebbs, MD   Assessment & Plan: Visit Diagnoses:  1. Acute medial meniscus tear, right, initial encounter   2. Positive ANA (antinuclear antibody)     Plan: Impression is right knee medial meniscus tear with underlying medial compartment degenerative changes.  At this point, I believe the patient is most symptomatic from her new meniscus tear.  We have discussed surgical intervention to include a right knee arthroscopic debridement medial meniscus and chondroplasty.  We will also refer her to rheumatology as she had positive of ANA.  She will follow-up with [place] soon after surgery for her first postop appointment.  Follow-Up Instructions: Return for 2 week postop visit.   Orders:  Orders Placed This Encounter  Procedures  . Ambulatory referral to Rheumatology   No orders of the defined types were placed in this encounter.     Procedures: No procedures performed   Clinical Data: No additional findings.   Subjective: Chief Complaint  Patient presents with  . Right Knee - Pain    HPI patient is a pleasant 72 year old female who presents to our clinic today with right knee pain.  She does have a history of medial compartment osteoarthritis which is been injected with cortisone in the past with significant relief of symptoms.  She was doing well following her injection in January of this past year, until about a week ago.  She had a cramp in her leg and thinks she may have twisted her knee causing acute pain to the medial aspect.  She was seen in our office where her right knee was aspirated and injected with cortisone.  She notes no relief following this injection.  The pain she has is medial aspect.  Worse with bearing weight.  She denies any  locking or catching.  Nothing seems to make this better.  Of note, MRI obtained showed a complex medial meniscus tear as well as moderate medial compartment osteoarthritis.  Lab work from that same day also showed a positive ANA with a speckled pattern.  No history of autoimmune disease.  Review of Systems as detailed in HPI.  All others reviewed and are negative.   Objective: Vital Signs: There were no vitals taken for this visit.  Physical Exam well-developed well-nourished female no acute distress.  Alert and oriented x3.  Ortho Exam examination of the right knee shows a small effusion.  Range of motion 0 to 95 degrees.  Marked tenderness medial joint line with a positive medial McMurray.  Specialty Comments:  No specialty comments available.  Imaging: Mr Knee Right W/o Contrast  Result Date: 06/14/2018 CLINICAL DATA:  Episode of right knee pain in January, 2019 which resolved. The patient's pain recurred 1 week ago. No known injury. EXAM: MRI OF THE RIGHT KNEE WITHOUT CONTRAST TECHNIQUE: Multiplanar, multisequence MR imaging of the knee was performed. No intravenous contrast was administered. COMPARISON:  Plain films right knee 10/14/2017. FINDINGS: MENISCI Medial meniscus: There is a complex tear in the central aspect of the posterior horn. No displaced fragment is identified. Lateral meniscus: Mild intrasubstance degenerative signal without tear is identified. LIGAMENTS Cruciates:  Intact. Collaterals:  Intact.  CARTILAGE Patellofemoral:  Preserved. Medial: Marked cartilage thinning is present with associated joint space narrowing. Lateral:  Preserved. Joint:  Small to moderate joint effusion. Popliteal Fossa:  No Baker's cyst. Extensor Mechanism:  Intact. Bones: No fracture or worrisome lesion. Mild subchondral edema about the medial compartment is noted. Other: None. IMPRESSION: Advanced medial compartment osteoarthritis with an associated complex tear in the posterior horn of the medial  meniscus. The patellofemoral and lateral compartments are preserved. Electronically Signed   By: Inge Rise M.D.   On: 06/14/2018 07:55     PMFS History: Patient Active Problem List   Diagnosis Date Noted  . Positive ANA (antinuclear antibody) 06/14/2018  . Acute pain of right knee 06/14/2018  . Acute medial meniscus tear, right, initial encounter 06/14/2018   Past Medical History:  Diagnosis Date  . GERD (gastroesophageal reflux disease)   . High cholesterol   . Hypertension     History reviewed. No pertinent family history.  History reviewed. No pertinent surgical history. Social History   Occupational History  . Not on file  Tobacco Use  . Smoking status: Never Smoker  . Smokeless tobacco: Never Used  Substance and Sexual Activity  . Alcohol use: No  . Drug use: Not on file  . Sexual activity: Not on file

## 2018-06-20 ENCOUNTER — Encounter (HOSPITAL_BASED_OUTPATIENT_CLINIC_OR_DEPARTMENT_OTHER): Payer: Self-pay | Admitting: *Deleted

## 2018-06-20 ENCOUNTER — Other Ambulatory Visit: Payer: Self-pay

## 2018-06-21 ENCOUNTER — Telehealth (INDEPENDENT_AMBULATORY_CARE_PROVIDER_SITE_OTHER): Payer: Self-pay | Admitting: Orthopaedic Surgery

## 2018-06-21 NOTE — Telephone Encounter (Signed)
Approved now per [employer] web site [employer] w/preservice center to advise

## 2018-06-21 NOTE — Telephone Encounter (Signed)
Sh[facility]a Thompson left 2 voice mails and an email yesterday and then this message. I emailed her back as soon as she emailed me advising that it was still pending with [employer]. I just checked again and it is still pending. [name] is aware that pt may need to be rescheduled if we can't get the auth

## 2018-06-21 NOTE — Telephone Encounter (Signed)
Susie from [facility] left a message yesterday requesting authorization for the CPT codes for the patient's procedure.  QA#[phone] [id number].  Thank you.

## 2018-06-22 ENCOUNTER — Ambulatory Visit (HOSPITAL_BASED_OUTPATIENT_CLINIC_OR_DEPARTMENT_OTHER): Payer: Medicare HMO | Admitting: Anesthesiology

## 2018-06-22 ENCOUNTER — Encounter (HOSPITAL_BASED_OUTPATIENT_CLINIC_OR_DEPARTMENT_OTHER)
Admission: RE | Disposition: A | Discharge status: Home / Self Care | Payer: Self-pay | Source: Ambulatory Visit | Attending: Orthopaedic Surgery

## 2018-06-22 ENCOUNTER — Encounter (HOSPITAL_BASED_OUTPATIENT_CLINIC_OR_DEPARTMENT_OTHER): Payer: Self-pay | Admitting: Anesthesiology

## 2018-06-22 ENCOUNTER — Other Ambulatory Visit: Payer: Self-pay

## 2018-06-22 ENCOUNTER — Ambulatory Visit (HOSPITAL_BASED_OUTPATIENT_CLINIC_OR_DEPARTMENT_OTHER)
Admission: RE | Admit: 2018-06-22 | Discharge: 2018-06-22 | Disposition: A | Discharge status: Home / Self Care | Payer: Medicare HMO | Source: Ambulatory Visit | Attending: Orthopaedic Surgery | Admitting: Orthopaedic Surgery

## 2018-06-22 ENCOUNTER — Encounter (INDEPENDENT_AMBULATORY_CARE_PROVIDER_SITE_OTHER): Payer: Self-pay | Admitting: Orthopaedic Surgery

## 2018-06-22 DIAGNOSIS — X509XXA Other and unspecified overexertion or strenuous movements or postures, initial encounter: Secondary | ICD-10-CM | POA: Insufficient documentation

## 2018-06-22 DIAGNOSIS — K219 Gastro-esophageal reflux disease without esophagitis: Secondary | ICD-10-CM | POA: Insufficient documentation

## 2018-06-22 DIAGNOSIS — M94261 Chondromalacia, right knee: Secondary | ICD-10-CM

## 2018-06-22 DIAGNOSIS — M1711 Unilateral primary osteoarthritis, right knee: Secondary | ICD-10-CM | POA: Diagnosis not present

## 2018-06-22 DIAGNOSIS — I10 Essential (primary) hypertension: Secondary | ICD-10-CM | POA: Insufficient documentation

## 2018-06-22 DIAGNOSIS — Z6839 Body mass index (BMI) 39.0-39.9, adult: Secondary | ICD-10-CM | POA: Insufficient documentation

## 2018-06-22 DIAGNOSIS — X500XXA Overexertion from strenuous movement or load, initial encounter: Secondary | ICD-10-CM | POA: Diagnosis not present

## 2018-06-22 DIAGNOSIS — M659 Synovitis and tenosynovitis, unspecified: Secondary | ICD-10-CM | POA: Diagnosis not present

## 2018-06-22 DIAGNOSIS — Z9049 Acquired absence of other specified parts of digestive tract: Secondary | ICD-10-CM | POA: Diagnosis not present

## 2018-06-22 DIAGNOSIS — S83241A Other tear of medial meniscus, current injury, right knee, initial encounter: Secondary | ICD-10-CM | POA: Diagnosis present

## 2018-06-22 DIAGNOSIS — Y939 Activity, unspecified: Secondary | ICD-10-CM | POA: Diagnosis not present

## 2018-06-22 DIAGNOSIS — M9420 Chondromalacia, unspecified site: Secondary | ICD-10-CM | POA: Insufficient documentation

## 2018-06-22 DIAGNOSIS — M23321 Other meniscus derangements, posterior horn of medial meniscus, right knee: Secondary | ICD-10-CM | POA: Diagnosis not present

## 2018-06-22 DIAGNOSIS — E7800 Pure hypercholesterolemia, unspecified: Secondary | ICD-10-CM | POA: Insufficient documentation

## 2018-06-22 HISTORY — PX: KNEE ARTHROSCOPY WITH MEDIAL MENISECTOMY: SHX5651

## 2018-06-22 HISTORY — DX: Other tear of medial meniscus, current injury, unspecified knee, initial encounter: S83.249A

## 2018-06-22 HISTORY — DX: Unspecified osteoarthritis, unspecified site: M19.90

## 2018-06-22 SURGERY — ARTHROSCOPY, KNEE, WITH MEDIAL MENISCECTOMY
Anesthesia: General | Site: Knee | Laterality: Right

## 2018-06-22 MED ORDER — MEPERIDINE HCL 25 MG/ML IJ SOLN: 6.2500 mg | INTRAMUSCULAR | Status: DC | PRN

## 2018-06-22 MED ORDER — ONDANSETRON HCL 4 MG PO TABS: 4.0000 mg | ORAL_TABLET | Freq: Three times a day (TID) | ORAL | 0 refills | Status: DC | PRN

## 2018-06-22 MED ORDER — ONDANSETRON HCL 4 MG/2ML IJ SOLN: 4.0000 mg | Freq: Once | INTRAMUSCULAR | Status: DC | PRN

## 2018-06-22 MED ORDER — MIDAZOLAM HCL 2 MG/2ML IJ SOLN: 1.0000 mg | INTRAMUSCULAR | Status: DC | PRN

## 2018-06-22 MED ORDER — CHLORHEXIDINE GLUCONATE 4 % EX LIQD: 60.0000 mL | Freq: Once | CUTANEOUS | Status: DC

## 2018-06-22 MED ORDER — PHENYLEPHRINE 40 MCG/ML (10ML) SYRINGE FOR IV PUSH (FOR BLOOD PRESSURE SUPPORT)
PREFILLED_SYRINGE | INTRAVENOUS | Status: AC
  Filled 2018-06-22: qty 10

## 2018-06-22 MED ORDER — FENTANYL CITRATE (PF) 100 MCG/2ML IJ SOLN
INTRAMUSCULAR | Status: AC
  Filled 2018-06-22: qty 2

## 2018-06-22 MED ORDER — PROMETHAZINE HCL 25 MG PO TABS: 25.0000 mg | ORAL_TABLET | Freq: Four times a day (QID) | ORAL | 1 refills | Status: DC | PRN

## 2018-06-22 MED ORDER — SODIUM CHLORIDE 0.9 % IR SOLN
Status: DC | PRN
  Administered 2018-06-22: 3000 mL

## 2018-06-22 MED ORDER — LACTATED RINGERS IV SOLN
INTRAVENOUS | Status: DC
  Administered 2018-06-22: 11:00:00 via INTRAVENOUS

## 2018-06-22 MED ORDER — OXYCODONE HCL 5 MG/5ML PO SOLN: 5.0000 mg | Freq: Once | ORAL | Status: AC | PRN

## 2018-06-22 MED ORDER — PROPOFOL 10 MG/ML IV BOLUS
INTRAVENOUS | Status: DC | PRN
  Administered 2018-06-22: 150 mg via INTRAVENOUS

## 2018-06-22 MED ORDER — ONDANSETRON HCL 4 MG/2ML IJ SOLN
INTRAMUSCULAR | Status: AC
  Filled 2018-06-22: qty 2

## 2018-06-22 MED ORDER — OXYCODONE HCL 5 MG PO TABS
5.0000 mg | ORAL_TABLET | Freq: Once | ORAL | Status: AC | PRN
  Administered 2018-06-22: 5 mg via ORAL

## 2018-06-22 MED ORDER — CEFAZOLIN SODIUM-DEXTROSE 2-4 GM/100ML-% IV SOLN
INTRAVENOUS | Status: AC
  Filled 2018-06-22: qty 100

## 2018-06-22 MED ORDER — KETOROLAC TROMETHAMINE 30 MG/ML IJ SOLN
INTRAMUSCULAR | Status: DC | PRN
  Administered 2018-06-22: 30 mg via INTRAVENOUS

## 2018-06-22 MED ORDER — CEFAZOLIN SODIUM-DEXTROSE 2-4 GM/100ML-% IV SOLN
2.0000 g | INTRAVENOUS | Status: AC
  Administered 2018-06-22: 2 g via INTRAVENOUS

## 2018-06-22 MED ORDER — BUPIVACAINE HCL (PF) 0.25 % IJ SOLN
INTRAMUSCULAR | Status: DC | PRN
  Administered 2018-06-22: 20 mL

## 2018-06-22 MED ORDER — DEXAMETHASONE SODIUM PHOSPHATE 10 MG/ML IJ SOLN
INTRAMUSCULAR | Status: AC
  Filled 2018-06-22: qty 1

## 2018-06-22 MED ORDER — OXYCODONE HCL 5 MG PO TABS
ORAL_TABLET | ORAL | Status: AC
  Filled 2018-06-22: qty 1

## 2018-06-22 MED ORDER — LACTATED RINGERS IV SOLN
INTRAVENOUS | Status: DC
  Administered 2018-06-22: 12:00:00 via INTRAVENOUS

## 2018-06-22 MED ORDER — SENNOSIDES-DOCUSATE SODIUM 8.6-50 MG PO TABS: 1.0000 | ORAL_TABLET | Freq: Every evening | ORAL | 1 refills | Status: DC | PRN

## 2018-06-22 MED ORDER — ONDANSETRON HCL 4 MG/2ML IJ SOLN
INTRAMUSCULAR | Status: DC | PRN
  Administered 2018-06-22: 4 mg via INTRAVENOUS

## 2018-06-22 MED ORDER — PROPOFOL 10 MG/ML IV BOLUS
INTRAVENOUS | Status: AC
  Filled 2018-06-22: qty 20

## 2018-06-22 MED ORDER — DEXAMETHASONE SODIUM PHOSPHATE 4 MG/ML IJ SOLN
INTRAMUSCULAR | Status: DC | PRN
  Administered 2018-06-22: 10 mg via INTRAVENOUS

## 2018-06-22 MED ORDER — FENTANYL CITRATE (PF) 100 MCG/2ML IJ SOLN: 25.0000 ug | INTRAMUSCULAR | Status: DC | PRN

## 2018-06-22 MED ORDER — LIDOCAINE 2% (20 MG/ML) 5 ML SYRINGE
INTRAMUSCULAR | Status: DC | PRN
  Administered 2018-06-22: 80 mg via INTRAVENOUS

## 2018-06-22 MED ORDER — KETOROLAC TROMETHAMINE 30 MG/ML IJ SOLN
INTRAMUSCULAR | Status: AC
  Filled 2018-06-22: qty 1

## 2018-06-22 MED ORDER — SCOPOLAMINE 1 MG/3DAYS TD PT72: 1.0000 | MEDICATED_PATCH | Freq: Once | TRANSDERMAL | Status: DC | PRN

## 2018-06-22 MED ORDER — FENTANYL CITRATE (PF) 100 MCG/2ML IJ SOLN
50.0000 ug | INTRAMUSCULAR | Status: AC | PRN
  Administered 2018-06-22: 25 ug via INTRAVENOUS
  Administered 2018-06-22: 50 ug via INTRAVENOUS
  Administered 2018-06-22: 25 ug via INTRAVENOUS

## 2018-06-22 MED ORDER — EPHEDRINE 5 MG/ML INJ
INTRAVENOUS | Status: AC
  Filled 2018-06-22: qty 10

## 2018-06-22 MED ORDER — HYDROCODONE-ACETAMINOPHEN 7.5-325 MG PO TABS: 1.0000 | ORAL_TABLET | Freq: Four times a day (QID) | ORAL | 0 refills | Status: DC | PRN

## 2018-06-22 MED ORDER — LIDOCAINE 2% (20 MG/ML) 5 ML SYRINGE
INTRAMUSCULAR | Status: AC
  Filled 2018-06-22: qty 5

## 2018-06-22 SURGICAL SUPPLY — 47 items
BANDAGE ACE 6X5 VEL STRL LF (GAUZE/BANDAGES/DRESSINGS) ×6 IMPLANT
BANDAGE ESMARK 6X9 LF (GAUZE/BANDAGES/DRESSINGS) IMPLANT
BLADE 4.2CUDA (BLADE) ×3 IMPLANT
BLADE CUDA GRT WHITE 3.5 (BLADE) IMPLANT
BLADE CUDA SHAVER 3.5 (BLADE) IMPLANT
BLADE CUTTER GATOR 3.5 (BLADE) IMPLANT
BLADE GREAT WHITE 4.2 (BLADE) IMPLANT
BLADE GREAT WHITE 4.2MM (BLADE)
BNDG CMPR 9X6 STRL LF SNTH (GAUZE/BANDAGES/DRESSINGS)
BNDG ESMARK 6X9 LF (GAUZE/BANDAGES/DRESSINGS)
CUFF TOURNIQUET SINGLE 34IN LL (TOURNIQUET CUFF) ×3 IMPLANT
DRAPE ARTHROSCOPY W/POUCH 90 (DRAPES) ×3 IMPLANT
DRAPE IMP U-DRAPE 54X76 (DRAPES) ×3 IMPLANT
DRAPE U-SHAPE 47X51 STRL (DRAPES) ×3 IMPLANT
DURAPREP 26ML APPLICATOR (WOUND CARE) ×3 IMPLANT
ELECT MENISCUS 165MM 90D (ELECTRODE) IMPLANT
ELECT REM PT RETURN 9FT ADLT (ELECTROSURGICAL)
ELECTRODE REM PT RTRN 9FT ADLT (ELECTROSURGICAL) IMPLANT
GAUZE SPONGE 4X4 12PLY STRL (GAUZE/BANDAGES/DRESSINGS) ×3 IMPLANT
GAUZE XEROFORM 1X8 LF (GAUZE/BANDAGES/DRESSINGS) ×3 IMPLANT
GLOVE BIO SURGEON STRL SZ 6.5 (GLOVE) ×1 IMPLANT
GLOVE BIO SURGEONS STRL SZ 6.5 (GLOVE) ×1
GLOVE BIOGEL PI IND STRL 7.0 (GLOVE) ×1 IMPLANT
GLOVE BIOGEL PI INDICATOR 7.0 (GLOVE) ×6
GLOVE ECLIPSE 7.0 STRL STRAW (GLOVE) ×3 IMPLANT
GLOVE SKINSENSE NS SZ7.5 (GLOVE) ×2
GLOVE SKINSENSE STRL SZ7.5 (GLOVE) ×1 IMPLANT
GLOVE SURG SYN 7.5  E (GLOVE) ×2
GLOVE SURG SYN 7.5 E (GLOVE) ×1 IMPLANT
GLOVE SURG SYN 7.5 PF PI (GLOVE) ×1 IMPLANT
GOWN STRL REIN XL XLG (GOWN DISPOSABLE) ×3 IMPLANT
GOWN STRL REUS W/ TWL LRG LVL3 (GOWN DISPOSABLE) ×1 IMPLANT
GOWN STRL REUS W/ TWL XL LVL3 (GOWN DISPOSABLE) ×1 IMPLANT
GOWN STRL REUS W/TWL LRG LVL3 (GOWN DISPOSABLE) ×3
GOWN STRL REUS W/TWL XL LVL3 (GOWN DISPOSABLE) ×3
KNEE WRAP E Z 3 GEL PACK (MISCELLANEOUS) ×3 IMPLANT
MANIFOLD NEPTUNE II (INSTRUMENTS) ×3 IMPLANT
PACK ARTHROSCOPY DSU (CUSTOM PROCEDURE TRAY) ×3 IMPLANT
PACK BASIN DAY SURGERY FS (CUSTOM PROCEDURE TRAY) ×3 IMPLANT
PENCIL BUTTON HOLSTER BLD 10FT (ELECTRODE) IMPLANT
RESECTOR FULL RADIUS 4.2MM (BLADE) IMPLANT
SHAVER 4.2 MM LANZA 9391A (BLADE) ×3 IMPLANT
SUT ETHILON 3 0 PS 1 (SUTURE) ×3 IMPLANT
TOWEL GREEN STERILE FF (TOWEL DISPOSABLE) ×3 IMPLANT
TOWEL OR NON WOVEN STRL DISP B (DISPOSABLE) IMPLANT
TUBING ARTHRO INFLOW-ONLY STRL (TUBING) ×1 IMPLANT
WATER STERILE IRR 1000ML POUR (IV SOLUTION) ×3 IMPLANT

## 2018-06-22 NOTE — Op Note (Signed)
   Surgery Date: 06/22/2018  Surgeon(s): Leandrew Koyanagi, MD  ASSIST: Madalyn Rob, PA-C; necessary for the timely completion of procedure and due to complexity of procedure.  ANESTHESIA:  general  FLUIDS: Per anesthesia record.   ESTIMATED BLOOD LOSS: minimal  PREOPERATIVE DIAGNOSES:  1. Right knee medial meniscus tear 2. Right knee synovitis 3. 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 Right knee arthroscopy with major synovectomy 2. Right knee arthroscopy with arthroscopic partial medial meniscectomy 3. Right knee arthroscopy with arthroscopic abrasion arthroplasty medial femoral condyle  DESCRIPTION OF PROCEDURE: Shawna Thompson is a 72 y.o.-year-old female with right knee medial meniscus tear. Plans are to proceed with partial medial meniscectomy and diagnostic arthroscopy with debridement as indicated. Full discussion held regarding risks benefits alternatives and complications related surgical intervention. Conservative care options reviewed. All questions answered.  The patient was identified in the preoperative holding area and the operative extremity was marked. The patient was brought to the operating room and transferred to operating table in a supine position. Satisfactory general anesthesia was induced by anesthesiology.    Standard anterolateral, anteromedial arthroscopy portals were obtained. The anteromedial portal was obtained with a spinal needle for localization under direct visualization with subsequent diagnostic findings.   Incisions were made for arthroscopic portals.  Major synovectomy in all 3 compartments was performed.  We then addressed the medial compartment first.  We encountered a complex tear of the posterior horn of the medial meniscus.  Basket and full radius shaver were used to trim the meniscus back to a stable border.  The root itself was intact.  Unfortunately, less than 50%  of the posterior horn was left over after debridement.  There was also a focal area of full thickness chondromalacia of the medial femoral condyle of the extension weight bearing surface.  Chondroplasty was performed back to the stable border.  Abrasion arthroplasty was then performed down to subchondral bleeding bone with a high speed burr.  The lateral and patellofemoral compartments were unremarkable aside from the synovitis. Excess fluid was removed from the joint.  Incisions were closed with nylon sutures.  Sterile dressings applied.  Suprapatellar pouch and gutters: moderate synovitis or debris. Patella chondral surface: Grade 0 Trochlear chondral surface: Grade 0 Patellofemoral tracking: normal Medial meniscus: complex tear of posterior horn.  Medial femoral condyle flexion bearing surface: Grade 2 Medial femoral condyle extension bearing surface: Grade 4 focal chondromalacia Medial tibial plateau: Grade 1 Anterior cruciate ligament:stable Posterior cruciate ligament:stable Lateral meniscus: normal.   Lateral femoral condyle flexion bearing surface: Grade 0 Lateral femoral condyle extension bearing surface: Grade 0 Lateral tibial plateau: Grade 0  DISPOSITION: The patient was awakened from general anesthetic, extubated, taken to the recovery room in medically stable condition, no apparent complications. The patient may be weightbearing as tolerated to the operative lower extremity.  Range of motion of right knee as tolerated.  [name], MD [facility] [phone] 12:32 PM

## 2018-06-22 NOTE — Discharge Instructions (Signed)

## 2018-06-22 NOTE — Anesthesia Procedure Notes (Signed)
Procedure Name: LMA Insertion Date/Time: 06/22/2018 11:52 AM Performed by: Lyndee Leo, CRNA Pre-anesthesia Checklist: Patient identified, Emergency Drugs available, Suction available and Patient being monitored Patient Re-evaluated:Patient Re-evaluated prior to induction Oxygen Delivery Method: Circle system utilized Preoxygenation: Pre-oxygenation with 100% oxygen Induction Type: IV induction Ventilation: Mask ventilation without difficulty LMA: LMA inserted LMA Size: 4.0 Number of attempts: 1 Airway Equipment and Method: Bite block Placement Confirmation: positive ETCO2 Tube secured with: Tape Dental Injury: Teeth and Oropharynx as per pre-operative assessment

## 2018-06-22 NOTE — Anesthesia Preprocedure Evaluation (Signed)
Anesthesia Evaluation  Patient identified by MRN, date of birth, ID band Patient awake    Reviewed: Allergy & Precautions, NPO status , Patient's Chart, lab work & pertinent test results  Airway Mallampati: II  TM Distance: >3 FB Neck ROM: Full    Dental no notable dental hx.    Pulmonary neg pulmonary ROS,    Pulmonary exam normal breath sounds clear to auscultation       Cardiovascular hypertension, Pt. on medications Normal cardiovascular exam Rhythm:Regular Rate:Normal     Neuro/Psych negative neurological ROS  negative psych ROS   GI/Hepatic Neg liver ROS, GERD  ,  Endo/Other  Morbid obesity  Renal/GU negative Renal ROS     Musculoskeletal  (+) Arthritis ,   Abdominal   Peds  Hematology negative hematology ROS (+)   Anesthesia Other Findings   Reproductive/Obstetrics negative OB ROS                             Anesthesia Physical Anesthesia Plan  ASA: III  Anesthesia Plan: General   Post-op Pain Management:    Induction: Intravenous  PONV Risk Score and Plan: 4 or greater and Ondansetron, Dexamethasone, Midazolam and Treatment may vary due to age or medical condition  Airway Management Planned: LMA  Additional Equipment: None  Intra-op Plan:   Post-operative Plan: Extubation in OR  Informed Consent: I have reviewed the patients History and Physical, chart, labs and discussed the procedure including the risks, benefits and alternatives for the proposed anesthesia with the patient or authorized representative who has indicated his/her understanding and acceptance.   Dental advisory given  Plan Discussed with: CRNA  Anesthesia Plan Comments:         Anesthesia Quick Evaluation

## 2018-06-22 NOTE — Transfer of Care (Signed)
Immediate Anesthesia Transfer of Care Note  Patient: Shawna Thompson  Procedure(s) Performed: RIGHT KNEE ARTHROSCOPY WITH MEDIAL MENISCECTOMY, CHONDROPLASTY (Right Knee)  Patient Location: PACU  Anesthesia Type:General  Level of Consciousness: awake, drowsy and patient cooperative  Airway & Oxygen Therapy: Patient Spontanous Breathing and Patient connected to face mask oxygen  Post-op Assessment: Report given to RN and Post -op Vital signs reviewed and stable  Post vital signs: Reviewed and stable  Last Vitals:  Vitals Value Taken Time  BP 141/69 06/22/2018 12:43 PM  Temp    Pulse 88 06/22/2018 12:45 PM  Resp 13 06/22/2018 12:45 PM  SpO2 100 % 06/22/2018 12:45 PM    Last Pain:  Vitals:   06/22/18 1005  TempSrc: Oral  PainSc: 7       Patients Stated Pain Goal: 5 (54/62/70 3500)  Complications: No apparent anesthesia complications

## 2018-06-22 NOTE — H&P (Signed)
PREOPERATIVE H&P  Chief Complaint: right knee medial meniscal tear, osteoarthritis  HPI: Shawna Thompson is a 72 y.o. female who presents for surgical treatment of right knee medial meniscal tear, osteoarthritis.  She denies any changes in medical history.  Past Medical History:  Diagnosis Date  . Arthritis   . GERD (gastroesophageal reflux disease)   . High cholesterol   . Hypertension   . MMT (medial meniscus tear)    right   Past Surgical History:  Procedure Laterality Date  . CHOLECYSTECTOMY    . SHOULDER ARTHROSCOPY     removal of bone spur  . TONSILLECTOMY    . TUBAL LIGATION     Social History   Socioeconomic History  . Marital status: Widowed    Spouse name: Not on file  . Number of children: Not on file  . Years of education: Not on file  . Highest education level: Not on file  Occupational History  . Not on file  Social Needs  . Financial resource strain: Not on file  . Food insecurity:    Worry: Not on file    Inability: Not on file  . Transportation needs:    Medical: Not on file    Non-medical: Not on file  Tobacco Use  . Smoking status: Never Smoker  . Smokeless tobacco: Never Used  Substance and Sexual Activity  . Alcohol use: Yes    Comment: social  . Drug use: Not on file  . Sexual activity: Not on file  Lifestyle  . Physical activity:    Days per week: Not on file    Minutes per session: Not on file  . Stress: Not on file  Relationships  . Social connections:    Talks on phone: Not on file    Gets together: Not on file    Attends religious service: Not on file    Active member of club or organization: Not on file    Attends meetings of clubs or organizations: Not on file    Relationship status: Not on file  Other Topics Concern  . Not on file  Social History Narrative  . Not on file   History reviewed. No pertinent family history. No Known Allergies Prior to Admission medications   Medication Sig Start Date End Date  Taking? Authorizing Provider  esomeprazole (NEXIUM) 20 MG capsule Take 20 mg by mouth 2 (two) times daily before a meal.   Yes [provider]  lisinopril (PRINIVIL,ZESTRIL) 20 MG tablet Take 20 mg by mouth 2 (two) times daily. 02/14/18  Yes [provider]  meclizine (ANTIVERT) 25 MG tablet Take 25 mg by mouth every 8 (eight) hours as needed for dizziness.   Yes [provider]  meloxicam (MOBIC) 7.5 MG tablet TAKE 1 TABLET(7.5 MG) BY MOUTH TWICE DAILY AS NEEDED FOR PAIN 10/14/17  Yes Leandrew Koyanagi, MD  oxybutynin (DITROPAN) 5 MG tablet Take 5 mg by mouth every evening. 02/14/18  Yes [provider]     Positive ROS: All other systems have been reviewed and were otherwise negative with the exception of those mentioned in the HPI and as above.  Physical Exam: General: Alert, no acute distress Cardiovascular: No pedal edema Respiratory: No cyanosis, no use of accessory musculature GI: abdomen soft Skin: No lesions in the area of chief complaint Neurologic: Sensation intact distally Psychiatric: Patient is competent for consent with normal mood and affect Lymphatic: no lymphedema  MUSCULOSKELETAL: exam stable  Assessment: right knee  medial meniscal tear, osteoarthritis  Plan: Plan for Procedure(s): RIGHT KNEE ARTHROSCOPY WITH MEDIAL MENISCECTOMY, CHONDROPLASTY  The risks benefits and alternatives were discussed with the patient including but not limited to the risks of nonoperative treatment, versus surgical intervention including infection, bleeding, nerve injury,  blood clots, cardiopulmonary complications, morbidity, mortality, among others, and they were willing to proceed.   Eduard Roux, MD   06/22/2018 9:33 AM

## 2018-06-23 ENCOUNTER — Encounter (HOSPITAL_BASED_OUTPATIENT_CLINIC_OR_DEPARTMENT_OTHER): Payer: Self-pay | Admitting: Orthopaedic Surgery

## 2018-06-23 NOTE — Anesthesia Postprocedure Evaluation (Signed)
Anesthesia Post Note  Patient: Shawna Thompson  Procedure(s) Performed: RIGHT KNEE ARTHROSCOPY WITH MEDIAL MENISCECTOMY, CHONDROPLASTY (Right Knee)     Patient location during evaluation: PACU Anesthesia Type: General Level of consciousness: sedated and patient cooperative Pain management: pain level controlled Vital Signs Assessment: post-procedure vital signs reviewed and stable Respiratory status: spontaneous breathing Cardiovascular status: stable Anesthetic complications: no    Last Vitals:  Vitals:   06/22/18 1330 06/22/18 1427  BP: (!) 147/87 (!) 157/82  Pulse: 79 82  Resp: 12 16  Temp:  37.2 C  SpO2: 99% 97%    Last Pain:  Vitals:   06/22/18 1427  TempSrc: Oral  PainSc: [place]

## 2018-06-24 ENCOUNTER — Ambulatory Visit (INDEPENDENT_AMBULATORY_CARE_PROVIDER_SITE_OTHER): Payer: Medicare HMO | Admitting: Orthopaedic Surgery

## 2018-07-08 ENCOUNTER — Ambulatory Visit (INDEPENDENT_AMBULATORY_CARE_PROVIDER_SITE_OTHER): Payer: Medicare HMO | Admitting: Orthopaedic Surgery

## 2018-07-08 ENCOUNTER — Other Ambulatory Visit (INDEPENDENT_AMBULATORY_CARE_PROVIDER_SITE_OTHER): Payer: Self-pay | Admitting: Orthopaedic Surgery

## 2018-07-08 ENCOUNTER — Encounter (INDEPENDENT_AMBULATORY_CARE_PROVIDER_SITE_OTHER): Payer: Self-pay | Admitting: Orthopaedic Surgery

## 2018-07-08 DIAGNOSIS — M1711 Unilateral primary osteoarthritis, right knee: Secondary | ICD-10-CM

## 2018-07-08 DIAGNOSIS — G8929 Other chronic pain: Secondary | ICD-10-CM

## 2018-07-08 DIAGNOSIS — M25561 Pain in right knee: Secondary | ICD-10-CM

## 2018-07-08 MED ORDER — DICLOFENAC SODIUM 1 % TD GEL: 2.0000 g | Freq: Four times a day (QID) | TRANSDERMAL | 5 refills | Status: DC

## 2018-07-08 MED ORDER — CELECOXIB 200 MG PO CAPS: 200.0000 mg | ORAL_CAPSULE | Freq: Two times a day (BID) | ORAL | 3 refills | Status: DC

## 2018-07-08 MED ORDER — HYDROCODONE-ACETAMINOPHEN 7.5-325 MG PO TABS: 1.0000 | ORAL_TABLET | Freq: Two times a day (BID) | ORAL | 0 refills | Status: DC | PRN

## 2018-07-08 NOTE — Progress Notes (Signed)
   Post-Op Visit Note   Patient: Shawna Thompson           Date of Birth: March 24, 1946           MRN: [id number] Visit Date: 07/08/2018 PCP: Nolene Ebbs, MD   Assessment & Plan:  Chief Complaint:  Chief Complaint  Patient presents with  . Right Knee - Pain, Routine Post Op   Visit Diagnoses:  1. Chronic pain of right knee   2. Primary osteoarthritis of right knee     Plan: [name] is 16 days status post right knee arthroscopy and abrasion arthroplasty.  She is overall doing well.  She takes up to 2 hydrocodone's in a day.  She states that she can already tell that her knee is feeling better from the surgery.  She takes Tylenol in between her hydrocodone.  Her incisions are fully healed.  No signs of infection.  She has a trace joint effusion.  Her range of motion is coming along very well.  At this point I would like her to continue with home exercise.  Prescriptions were filled today.  Recheck in 4 weeks.  We discussed low impact exercises for weight loss.  Follow-Up Instructions: Return in about 4 weeks (around 08/05/2018).   Orders:  No orders of the defined types were placed in this encounter.  Meds ordered this encounter  Medications  . HYDROcodone-acetaminophen (NORCO) 7.5-325 MG tablet    Sig: Take 1-2 tablets by mouth 2 (two) times daily as needed for moderate pain.    Dispense:  20 tablet    Refill:  0  . celecoxib (CELEBREX) 200 MG capsule    Sig: Take 1 capsule (200 mg total) by mouth 2 (two) times daily.    Dispense:  30 capsule    Refill:  3  . diclofenac sodium (VOLTAREN) 1 % GEL    Sig: Apply 2 g topically 4 (four) times daily.    Dispense:  1 Tube    Refill:  5    Imaging: No results found.  PMFS History: Patient Active Problem List   Diagnosis Date Noted  . Chondromalacia, right knee   . Other meniscus derangements, posterior horn of medial meniscus, right knee   . Positive ANA (antinuclear antibody) 06/14/2018  . Acute pain of right knee 06/14/2018   . Acute medial meniscus tear, right, initial encounter 06/14/2018   Past Medical History:  Diagnosis Date  . Arthritis   . GERD (gastroesophageal reflux disease)   . High cholesterol   . Hypertension   . MMT (medial meniscus tear)    right    History reviewed. No pertinent family history.  Past Surgical History:  Procedure Laterality Date  . CHOLECYSTECTOMY    . KNEE ARTHROSCOPY WITH MEDIAL MENISECTOMY Right 06/22/2018   Procedure: RIGHT KNEE ARTHROSCOPY WITH MEDIAL MENISCECTOMY, CHONDROPLASTY;  Surgeon: Leandrew Koyanagi, MD;  Location: [place];  Service: Orthopedics;  Laterality: Right;  . SHOULDER ARTHROSCOPY     removal of bone spur  . TONSILLECTOMY    . TUBAL LIGATION     Social History   Occupational History  . Not on file  Tobacco Use  . Smoking status: Never Smoker  . Smokeless tobacco: Never Used  Substance and Sexual Activity  . Alcohol use: Yes    Comment: social  . Drug use: Never  . Sexual activity: Not on file

## 2018-07-25 ENCOUNTER — Encounter (INDEPENDENT_AMBULATORY_CARE_PROVIDER_SITE_OTHER): Payer: Self-pay

## 2018-08-05 ENCOUNTER — Other Ambulatory Visit (INDEPENDENT_AMBULATORY_CARE_PROVIDER_SITE_OTHER): Payer: Self-pay | Admitting: Physician Assistant

## 2018-08-05 ENCOUNTER — Ambulatory Visit (INDEPENDENT_AMBULATORY_CARE_PROVIDER_SITE_OTHER): Payer: Medicare HMO | Admitting: Physician Assistant

## 2018-08-05 ENCOUNTER — Encounter (INDEPENDENT_AMBULATORY_CARE_PROVIDER_SITE_OTHER): Payer: Self-pay | Admitting: Physician Assistant

## 2018-08-05 DIAGNOSIS — Z9889 Other specified postprocedural states: Secondary | ICD-10-CM | POA: Diagnosis not present

## 2018-08-05 DIAGNOSIS — Z6841 Body Mass Index (BMI) 40.0 and over, adult: Secondary | ICD-10-CM

## 2018-08-05 MED ORDER — HYDROCODONE-ACETAMINOPHEN 5-325 MG PO TABS: 1.0000 | ORAL_TABLET | Freq: Three times a day (TID) | ORAL | 0 refills | Status: DC | PRN

## 2018-08-05 MED ORDER — BUPIVACAINE HCL 0.25 % IJ SOLN
2.0000 mL | INTRAMUSCULAR | Status: AC | PRN
  Administered 2018-08-05: 2 mL via INTRA_ARTICULAR

## 2018-08-05 MED ORDER — METHYLPREDNISOLONE ACETATE 40 MG/ML IJ SUSP
40.0000 mg | INTRAMUSCULAR | Status: AC | PRN
  Administered 2018-08-05: 40 mg via INTRA_ARTICULAR

## 2018-08-05 MED ORDER — LIDOCAINE HCL 1 % IJ SOLN
2.0000 mL | INTRAMUSCULAR | Status: AC | PRN
  Administered 2018-08-05: 2 mL

## 2018-08-05 NOTE — Progress Notes (Addendum)
   Procedure Note  Patient: PARUL PORCELLI             Date of Birth: 1945-11-23           MRN: [id number]             Visit Date: 08/05/2018  Procedures: Visit Diagnoses: S/P right knee arthroscopy - Plan: Large Joint Inj: R knee, bupivacaine (MARCAINE) 0.25 % (with pres) injection 2 mL, lidocaine (XYLOCAINE) 1 % (with pres) injection 2 mL, methylPREDNISolone acetate (DEPO-MEDROL) injection 40 mg  Morbid obesity (HCC)  Body mass index 40.0-44.9, adult ([place])  Large Joint Inj: R knee on 08/05/2018 9:46 AM Indications: pain Details: 22 G needle, anterolateral approach Medications: 2 mL lidocaine 1 %; 2 mL bupivacaine 0.25 %; 40 mg methylPREDNISolone acetate 40 MG/ML  PES BURSA

## 2018-08-05 NOTE — Progress Notes (Signed)
Post-Op Visit Note   Patient: Shawna Thompson           Date of Birth: [birth year]           MRN: [id number] Visit Date: 08/05/2018 PCP: Nolene Ebbs, MD   Assessment & Plan:  Chief Complaint:  Chief Complaint  Patient presents with  . Right Knee - Pain, Follow-up   Visit Diagnoses:  1. S/P right knee arthroscopy   2. Morbid obesity ([place])   3. Body mass index 40.0-44.9, adult [facility])     Plan: Patient is a pleasant 72 year old female who presents to our clinic today 44 days status post right knee arthroscopic debridement medial meniscus and abrasion chondroplasty, date of surgery 06/22/2018.  It was noted that the patient had grade 2 changes with focal grade 4 changes to the medial femoral condyle.  The patient still admits to moderate amount of pain but this is better than preop.  He has been compliant with her home exercise program.  No fevers or chills.  She has been taking hydrocodone and Tylenol with moderate relief of symptoms.  Examination of her right knee reveals well-healed surgical portals without evidence of infection.  Range of motion 0 to 115 degrees.  Marked tenderness over the Pez bursa.  She is neurovascularly intact distally.  At this point, I have counseled the patient on the fact that this can take a little while longer before her symptoms improve.  We will try a cortisone injection to the pes bursa today, and if she notes no relief of symptoms after 2 weeks she will call and let me know and we will get approval for Visco supplementation injection for the right knee.  Otherwise, follow-up with [place] as needed. The patient meets the AMA guidelines for Morbid (severe) obesity with a BMI > 40.0 and I have recommended weight loss.   Follow-Up Instructions: Return if symptoms worsen or fail to improve.   Orders:  No orders of the defined types were placed in this encounter.  No orders of the defined types were placed in this encounter.   Imaging: No new imaging  PMFS  History: Patient Active Problem List   Diagnosis Date Noted  . S/P right knee arthroscopy 08/05/2018  . Morbid obesity ([place]) 08/05/2018  . Body mass index 40.0-44.9, adult ([place]) 08/05/2018  . Chondromalacia, right knee   . Other meniscus derangements, posterior horn of medial meniscus, right knee   . Positive ANA (antinuclear antibody) 06/14/2018  . Acute pain of right knee 06/14/2018  . Acute medial meniscus tear, right, initial encounter 06/14/2018   Past Medical History:  Diagnosis Date  . Arthritis   . GERD (gastroesophageal reflux disease)   . High cholesterol   . Hypertension   . MMT (medial meniscus tear)    right    History reviewed. No pertinent family history.  Past Surgical History:  Procedure Laterality Date  . CHOLECYSTECTOMY    . KNEE ARTHROSCOPY WITH MEDIAL MENISECTOMY Right 06/22/2018   Procedure: RIGHT KNEE ARTHROSCOPY WITH MEDIAL MENISCECTOMY, CHONDROPLASTY;  Surgeon: Leandrew Koyanagi, MD;  Location: [place];  Service: Orthopedics;  Laterality: Right;  . SHOULDER ARTHROSCOPY     removal of bone spur  . TONSILLECTOMY    . TUBAL LIGATION     Social History   Occupational History  . Not on file  Tobacco Use  . Smoking status: Never Smoker  . Smokeless tobacco: Never Used  Substance and Sexual Activity  . Alcohol use:  Yes    Comment: social  . Drug use: Never  . Sexual activity: Not on file

## 2018-08-12 ENCOUNTER — Telehealth (INDEPENDENT_AMBULATORY_CARE_PROVIDER_SITE_OTHER): Payer: Self-pay | Admitting: Orthopaedic Surgery

## 2018-08-12 NOTE — Telephone Encounter (Signed)
Noted  

## 2018-08-12 NOTE — Telephone Encounter (Signed)
Authorization needed for Gel injection   [facility]

## 2018-08-12 NOTE — Telephone Encounter (Signed)
Gel injection has to be approved by Dr. Erlinda Hong first.  Thank You.

## 2018-08-12 NOTE — Telephone Encounter (Signed)
Per Lindsey's last note ok for gel injection

## 2018-08-24 ENCOUNTER — Telehealth (INDEPENDENT_AMBULATORY_CARE_PROVIDER_SITE_OTHER): Payer: Self-pay

## 2018-08-24 NOTE — Telephone Encounter (Signed)
Submitted VOB for Monovisc, right knee. 

## 2018-09-19 ENCOUNTER — Telehealth (INDEPENDENT_AMBULATORY_CARE_PROVIDER_SITE_OTHER): Payer: Self-pay

## 2018-09-19 NOTE — Telephone Encounter (Signed)
Called and left a VM for patient to CB to schedule an appointment for gel injection with Dr. Erlinda Hong.  Patient approved for Monovisc, right knee. [place] Patient will be responsible for 20% OOP. Co-pay of $35.00 No PA required

## 2018-09-21 ENCOUNTER — Telehealth (INDEPENDENT_AMBULATORY_CARE_PROVIDER_SITE_OTHER): Payer: Self-pay | Admitting: Orthopaedic Surgery

## 2018-09-21 NOTE — Telephone Encounter (Signed)
Patient called asked for a call back before she schedule the gel injection. Patient asked how long do the injection last. Patient asked if she can get a call back to discuss. The number to contact patient is [phone]

## 2018-09-26 ENCOUNTER — Other Ambulatory Visit (INDEPENDENT_AMBULATORY_CARE_PROVIDER_SITE_OTHER): Payer: Self-pay | Admitting: Physician Assistant

## 2018-09-26 NOTE — Telephone Encounter (Signed)
Patient has an appointment on 10/06/2018 with Mendel Ryder, Dr. Phoebe Sharps PA.

## 2018-09-30 ENCOUNTER — Telehealth (INDEPENDENT_AMBULATORY_CARE_PROVIDER_SITE_OTHER): Payer: Self-pay | Admitting: Orthopaedic Surgery

## 2018-09-30 NOTE — Telephone Encounter (Signed)
Patient having a lot of pain in Rt Hip, wants to come in Monday for hip injection (schedule is full,but wants to be worked in), callback [phone]

## 2018-09-30 NOTE — Telephone Encounter (Signed)
Yes please

## 2018-09-30 NOTE — Telephone Encounter (Signed)
Patient has appt with Mendel Ryder on 10/06/17. Will hold off and see if she can be worked in on [facility] schedule that day .

## 2018-09-30 NOTE — Telephone Encounter (Signed)
Do you want her to get an inj with Dr. Junius Roads?

## 2018-10-06 ENCOUNTER — Ambulatory Visit (INDEPENDENT_AMBULATORY_CARE_PROVIDER_SITE_OTHER): Payer: Medicare HMO | Admitting: Physician Assistant

## 2018-10-06 ENCOUNTER — Encounter (INDEPENDENT_AMBULATORY_CARE_PROVIDER_SITE_OTHER): Payer: Self-pay | Admitting: Physician Assistant

## 2018-10-06 ENCOUNTER — Ambulatory Visit (INDEPENDENT_AMBULATORY_CARE_PROVIDER_SITE_OTHER): Payer: Medicare HMO

## 2018-10-06 DIAGNOSIS — M7062 Trochanteric bursitis, left hip: Secondary | ICD-10-CM

## 2018-10-06 DIAGNOSIS — G8929 Other chronic pain: Secondary | ICD-10-CM

## 2018-10-06 DIAGNOSIS — M1711 Unilateral primary osteoarthritis, right knee: Secondary | ICD-10-CM

## 2018-10-06 DIAGNOSIS — M25561 Pain in right knee: Principal | ICD-10-CM

## 2018-10-06 MED ORDER — LIDOCAINE HCL 1 % IJ SOLN
3.0000 mL | INTRAMUSCULAR | Status: AC | PRN
  Administered 2018-10-06: 3 mL

## 2018-10-06 MED ORDER — BUPIVACAINE HCL 0.25 % IJ SOLN
2.0000 mL | INTRAMUSCULAR | Status: AC | PRN
  Administered 2018-10-06: 2 mL via INTRA_ARTICULAR

## 2018-10-06 MED ORDER — METHYLPREDNISOLONE ACETATE 40 MG/ML IJ SUSP
40.0000 mg | INTRAMUSCULAR | Status: AC | PRN
  Administered 2018-10-06: 40 mg via INTRA_ARTICULAR

## 2018-10-06 MED ORDER — HYALURONAN 88 MG/4ML IX SOSY
88.0000 mg | PREFILLED_SYRINGE | INTRA_ARTICULAR | Status: AC | PRN
  Administered 2018-10-06: 88 mg via INTRA_ARTICULAR

## 2018-10-06 MED ORDER — LIDOCAINE HCL 1 % IJ SOLN
2.0000 mL | INTRAMUSCULAR | Status: AC | PRN
  Administered 2018-10-06: 2 mL

## 2018-10-06 NOTE — Progress Notes (Signed)
Office Visit Note   Patient: Shawna Thompson           Date of Birth: 09-22-1946           MRN: [id number] Visit Date: 10/06/2018              Requested by: Nolene Ebbs, MD [address] [place], [place] [address] PCP: Nolene Ebbs, MD   Assessment & Plan: Visit Diagnoses:  1. Chronic pain of right knee   2. Trochanteric bursitis of left hip     Plan: Impression is right knee medial compartment osteoarthritis and left hip trochanteric bursitis.  In regards to the right knee, I am hopeful that the viscosupplementation injection will help relief her symptoms.  End of the day she will likely need a total knee replacement.  In regards to the left hip, this is likely result of her antalgic gait from her right knee.  I have discussed trying to improve her right knee symptoms before injecting her left hip, but she would like to go ahead and proceed with left hip trochanteric bursa injection.  We will do this today.  I will also provide her with an iliotibial band exercise program.  She will follow-up with [place] as needed.  Follow-Up Instructions: Return if symptoms worsen or fail to improve.   Orders:  Orders Placed This Encounter  Procedures  . Large Joint Inj: R knee  . Large Joint Inj  . XR HIP UNILAT W OR W/O PELVIS 2-3 VIEWS LEFT   No orders of the defined types were placed in this encounter.     Procedures: Large Joint Inj: R knee on 10/06/2018 1:38 PM Indications: pain Details: 22 G needle, anterolateral approach Medications: 2 mL bupivacaine 0.25 %; 88 mg Hyaluronan 88 MG/4ML; 2 mL lidocaine 1 %  Large Joint Inj: L greater trochanter on 10/06/2018 1:49 PM Indications: pain Details: 22 G needle, lateral approach Medications: 3 mL lidocaine 1 %; 2 mL bupivacaine 0.25 %; 40 mg methylPREDNISolone acetate 40 MG/ML      Clinical Data: No additional findings.   Subjective: Chief Complaint  Patient presents with  . Right Knee - Pain    HPI patient is a pleasant  73 year old female who presents to our clinic today for right knee Monovisc injection.  Recent MRI from 06/2018 showed marked degenerative changes medial compartment with marrow edema and a complex medial meniscus tear.  She has since had an arthroscopic debridement medial meniscus and abrasion arthroplasty with subsequent cortisone injection.  She is failed to get relief following these.  Other issue she brings up is her left hip.  No known injury.  She does note she has been walking with an antalgic gait for the past several months.  All her pain is lateral aspect.  Worse with ambulation and increased activity.  She denies any pain to the groin or top of the thigh.  No posterior pain.  No numbness, tingling or burning.  No focal weakness.  No previous cortisone injection.  Review of Systems as detailed in HPI.  All others reviewed and are negative.   Objective: Vital Signs: There were no vitals taken for this visit.  Physical Exam well-developed well-nourished female no acute distress.  Alert and oriented x3.  Ortho Exam stable exam of the right knee.  Left hip exam shows marked tenderness over the trochanteric bursa.  Negative logroll and negative straight leg raise.  Specialty Comments:  No specialty comments available.  Imaging: Xr Hip Unilat W  Or W/o Pelvis 2-3 Views Left  Result Date: 10/06/2018 Mild degenerative changes    PMFS History: Patient Active Problem List   Diagnosis Date Noted  . Trochanteric bursitis of left hip 10/06/2018  . S/P right knee arthroscopy 08/05/2018  . Morbid obesity ([place]) 08/05/2018  . Body mass index 40.0-44.9, adult ([place]) 08/05/2018  . Chondromalacia, right knee   . Other meniscus derangements, posterior horn of medial meniscus, right knee   . Positive ANA (antinuclear antibody) 06/14/2018  . Chronic pain of right knee 06/14/2018  . Acute medial meniscus tear, right, initial encounter 06/14/2018   Past Medical History:  Diagnosis Date  .  Arthritis   . GERD (gastroesophageal reflux disease)   . High cholesterol   . Hypertension   . MMT (medial meniscus tear)    right    History reviewed. No pertinent family history.  Past Surgical History:  Procedure Laterality Date  . CHOLECYSTECTOMY    . KNEE ARTHROSCOPY WITH MEDIAL MENISECTOMY Right 06/22/2018   Procedure: RIGHT KNEE ARTHROSCOPY WITH MEDIAL MENISCECTOMY, CHONDROPLASTY;  Surgeon: Leandrew Koyanagi, MD;  Location: [facility];  Service: Orthopedics;  Laterality: Right;  . SHOULDER ARTHROSCOPY     removal of bone spur  . TONSILLECTOMY    . TUBAL LIGATION     Social History   Occupational History  . Not on file  Tobacco Use  . Smoking status: Never Smoker  . Smokeless tobacco: Never Used  Substance and Sexual Activity  . Alcohol use: Yes    Comment: social  . Drug use: Never  . Sexual activity: Not on file

## 2018-10-31 ENCOUNTER — Other Ambulatory Visit (INDEPENDENT_AMBULATORY_CARE_PROVIDER_SITE_OTHER): Payer: Self-pay | Admitting: Orthopaedic Surgery

## 2019-02-28 ENCOUNTER — Other Ambulatory Visit: Payer: Self-pay

## 2019-02-28 ENCOUNTER — Ambulatory Visit: Payer: Medicare HMO | Admitting: Orthopaedic Surgery

## 2019-02-28 ENCOUNTER — Ambulatory Visit (INDEPENDENT_AMBULATORY_CARE_PROVIDER_SITE_OTHER): Payer: Medicare HMO

## 2019-02-28 ENCOUNTER — Encounter: Payer: Self-pay | Admitting: Orthopaedic Surgery

## 2019-02-28 DIAGNOSIS — M170 Bilateral primary osteoarthritis of knee: Secondary | ICD-10-CM | POA: Diagnosis not present

## 2019-02-28 MED ORDER — LIDOCAINE HCL 1 % IJ SOLN
3.0000 mL | INTRAMUSCULAR | Status: AC | PRN
  Administered 2019-02-28: 3 mL

## 2019-02-28 MED ORDER — METHYLPREDNISOLONE ACETATE 40 MG/ML IJ SUSP
13.3300 mg | INTRAMUSCULAR | Status: AC | PRN
  Administered 2019-02-28: 13.33 mg via INTRA_ARTICULAR

## 2019-02-28 MED ORDER — BUPIVACAINE HCL 0.25 % IJ SOLN
0.6600 mL | INTRAMUSCULAR | Status: AC | PRN
  Administered 2019-02-28: .66 mL via INTRA_ARTICULAR

## 2019-02-28 NOTE — Progress Notes (Signed)
Office Visit Note   Patient: Shawna Thompson           Date of Birth: [birth year]           MRN: [id number] Visit Date: 02/28/2019              Requested by: Nolene Ebbs, MD [address] [place], [place] [address] PCP: Nolene Ebbs, MD   Assessment & Plan: Visit Diagnoses:  1. Bilateral primary osteoarthritis of knee     Plan: impression is bilateral knee osteoarthritis.  In regards to the right knee, this has been injected with cortisone and hyaluronic acid.  We will reinject this with cortisone today as well as write for a medial compartment djd unloader brace.  I will also provide her with a handout on total knee arthroplasty as this is the definitive treatment.  In regards to the left knee, she has never had cortisone or hyaluronic acid.  We will inject this with cortisone today and write for a medial compartment djd unloader brace.  If she fails relief following the cortisone injection, we will get approval for Visco supplementation injection.  She will call and let us know.  This patient is diagnosed with osteoarthritis of the knee(s).    Radiographs show evidence of joint space narrowing, osteophytes, subchondral sclerosis and/or subchondral cysts.  This patient has knee pain which interferes with functional and activities of daily living.    This patient has experienced inadequate response, adverse effects and/or intolerance with conservative treatments such as acetaminophen, NSAIDS, topical creams, physical therapy or regular exercise, knee bracing and/or weight loss.   This patient has experienced inadequate response or has a contraindication to intra articular steroid injections for at least 3 months.   This patient is not scheduled to have a total knee replacement within 6 months of starting treatment with viscosupplementation.   Follow-Up Instructions: Return if symptoms worsen or fail to improve.   Orders:  Orders Placed This Encounter  Procedures  . Large  Joint Inj: bilateral knee  . XR KNEE 3 VIEW LEFT   No orders of the defined types were placed in this encounter.     Procedures: Large Joint Inj: bilateral knee on 02/28/2019 3:51 PM Indications: pain Details: 22 G needle, anterolateral approach Medications (Right): 0.66 mL bupivacaine 0.25 %; 3 mL lidocaine 1 %; 13.33 mg methylPREDNISolone acetate 40 MG/ML Medications (Left): 0.66 mL bupivacaine 0.25 %; 3 mL lidocaine 1 %; 13.33 mg methylPREDNISolone acetate 40 MG/ML      Clinical Data: No additional findings.   Subjective: Chief Complaint  Patient presents with  . Left Knee - Pain  . Right Knee - Follow-up    HPI patient is a pleasant 73 year old female who presents our clinic today with bilateral knee pain left greater than right.  History of osteoarthritis to the right knee.  She has seen [place] for this in the past and has had cortisone and viscosupplementation injections.  Last injection was viscosupplementation and was in January.  She noticed mild to moderate relief of symptoms.  She would like an injection today.  In regards to the left knee, her pain recently started.  It is located to the entire knee worse with activity.  She has tried over-the-counter medications without relief of symptoms.  No previous cortisone injection to the left knee.  Review of Systems as detailed in HPI.  All others reviewed and are negative.   Objective: Vital Signs: There were no vitals taken for this visit.  Physical Exam well-developed and well-nourished female in no acute distress.  Alert and oriented x3.  Ortho Exam examination of both knees shows trace effusions.  Range of motion 0 to 115 degrees.  Medial and lateral joint line tenderness.  Moderate patellofemoral crepitus.  Ligaments are stable.  She is neurovascular intact distally.  Specialty Comments:  No specialty comments available.  Imaging: Xr Knee 3 View Left  Result Date: 02/28/2019 Moderate degenerative changes medial  compartment    PMFS History: Patient Active Problem List   Diagnosis Date Noted  . Bilateral primary osteoarthritis of knee 02/28/2019  . Trochanteric bursitis of left hip 10/06/2018  . S/P right knee arthroscopy 08/05/2018  . Morbid obesity ([facility]) 08/05/2018  . Body mass index 40.0-44.9, adult ([facility]) 08/05/2018  . Chondromalacia, right knee   . Other meniscus derangements, posterior horn of medial meniscus, right knee   . Positive ANA (antinuclear antibody) 06/14/2018  . Chronic pain of right knee 06/14/2018  . Acute medial meniscus tear, right, initial encounter 06/14/2018   Past Medical History:  Diagnosis Date  . Arthritis   . GERD (gastroesophageal reflux disease)   . High cholesterol   . Hypertension   . MMT (medial meniscus tear)    right    History reviewed. No pertinent family history.  Past Surgical History:  Procedure Laterality Date  . CHOLECYSTECTOMY    . KNEE ARTHROSCOPY WITH MEDIAL MENISECTOMY Right 06/22/2018   Procedure: RIGHT KNEE ARTHROSCOPY WITH MEDIAL MENISCECTOMY, CHONDROPLASTY;  Surgeon: Leandrew Koyanagi, MD;  Location: [facility];  Service: Orthopedics;  Laterality: Right;  . SHOULDER ARTHROSCOPY     removal of bone spur  . TONSILLECTOMY    . TUBAL LIGATION     Social History   Occupational History  . Not on file  Tobacco Use  . Smoking status: Never Smoker  . Smokeless tobacco: Never Used  Substance and Sexual Activity  . Alcohol use: Yes    Comment: social  . Drug use: Never  . Sexual activity: Not on file

## 2019-03-04 ENCOUNTER — Telehealth: Payer: Self-pay | Admitting: Cardiology

## 2019-03-04 ENCOUNTER — Telehealth: Payer: Self-pay

## 2019-03-04 DIAGNOSIS — Z20822 Contact with and (suspected) exposure to covid-19: Secondary | ICD-10-CM

## 2019-03-04 NOTE — Telephone Encounter (Signed)
Pt informed of potential Covid 19 exposure at appt dated 02/28/19.  Appt scheduled 03/05/19 at 1145 at [facility]

## 2019-03-04 NOTE — Addendum Note (Signed)
Addended by: Carlisle Beers on: 03/04/2019 06:16 PM   Modules accepted: Orders

## 2019-03-04 NOTE — Telephone Encounter (Signed)
Patient called, left VM to return call to [phone] 7a-7p.

## 2019-03-04 NOTE — Telephone Encounter (Signed)
Left message for patient to call back [phone], call is in regards to recent office visit 5/26 and possible Covid exposure and testing appt.

## 2019-03-05 ENCOUNTER — Other Ambulatory Visit: Payer: Medicare HMO

## 2019-03-05 DIAGNOSIS — Z20822 Contact with and (suspected) exposure to covid-19: Secondary | ICD-10-CM

## 2019-03-06 LAB — NOVEL CORONAVIRUS, NAA: SARS-CoV-2, NAA: NOT DETECTED

## 2019-03-09 ENCOUNTER — Telehealth: Payer: Self-pay | Admitting: Radiology

## 2019-03-09 NOTE — Telephone Encounter (Signed)
Requesting bilateral knee gel injections. Is this ok?

## 2019-03-10 NOTE — Telephone Encounter (Signed)
Please see below.

## 2019-03-10 NOTE — Telephone Encounter (Signed)
yes

## 2019-03-10 NOTE — Telephone Encounter (Signed)
Noted  

## 2019-03-12 ENCOUNTER — Emergency Department (HOSPITAL_BASED_OUTPATIENT_CLINIC_OR_DEPARTMENT_OTHER): Payer: Medicare HMO

## 2019-03-12 ENCOUNTER — Other Ambulatory Visit: Payer: Self-pay

## 2019-03-12 ENCOUNTER — Encounter (HOSPITAL_BASED_OUTPATIENT_CLINIC_OR_DEPARTMENT_OTHER): Payer: Self-pay | Admitting: Emergency Medicine

## 2019-03-12 ENCOUNTER — Emergency Department (HOSPITAL_BASED_OUTPATIENT_CLINIC_OR_DEPARTMENT_OTHER)
Admission: EM | Admit: 2019-03-12 | Discharge: 2019-03-12 | Disposition: A | Discharge status: Home / Self Care | Payer: Medicare HMO | Attending: Emergency Medicine | Admitting: Emergency Medicine

## 2019-03-12 DIAGNOSIS — Z79899 Other long term (current) drug therapy: Secondary | ICD-10-CM | POA: Diagnosis not present

## 2019-03-12 DIAGNOSIS — I10 Essential (primary) hypertension: Secondary | ICD-10-CM | POA: Insufficient documentation

## 2019-03-12 DIAGNOSIS — M79605 Pain in left leg: Secondary | ICD-10-CM

## 2019-03-12 DIAGNOSIS — M25562 Pain in left knee: Secondary | ICD-10-CM | POA: Insufficient documentation

## 2019-03-12 DIAGNOSIS — G8929 Other chronic pain: Secondary | ICD-10-CM | POA: Diagnosis not present

## 2019-03-12 DIAGNOSIS — M25561 Pain in right knee: Secondary | ICD-10-CM | POA: Insufficient documentation

## 2019-03-12 MED ORDER — DICLOFENAC SODIUM 1 % TD GEL: 4.0000 g | Freq: Four times a day (QID) | TRANSDERMAL | 0 refills | Status: DC

## 2019-03-12 MED ORDER — ACETAMINOPHEN 325 MG PO TABS
650.0000 mg | ORAL_TABLET | Freq: Once | ORAL | Status: AC
  Administered 2019-03-12: 650 mg via ORAL
  Filled 2019-03-12: qty 2

## 2019-03-12 NOTE — ED Triage Notes (Signed)
L knee pain x 1 hour. She was walking and felt a pop.

## 2019-03-12 NOTE — ED Notes (Signed)
Patient transported to CT 

## 2019-03-12 NOTE — ED Provider Notes (Signed)
[facility] EMERGENCY DEPARTMENT Provider Note   CSN: [id number] Arrival date & time: 03/12/19  1428    History   Chief Complaint Chief Complaint  Patient presents with  . Knee Pain    HPI Shawna Thompson is a 73 y.o. female with a hx of GERD, HTN, hypercholesterolemia, & chronic R knee pain who presents to the ED w/ complaints of LLE pain that began shortly PTA. Patient states she was ambulating when she felt a pop w/ pain to the back of the L knee. She states she feels like it gave out some. No fall to the ground or direct trauma to the area. She states since the popping sensation she has had continued pain to the back of the knee radiating to the back of the upper thigh. She feels she cannot bear weight on the LLE. Other than weight bearing no other alleviating/aggravating factors. Denies numbness, tingling, or weakness. No recent trauma/change in activity. Denies redness, swelling, or fevers.      HPI  Past Medical History:  Diagnosis Date  . Arthritis   . GERD (gastroesophageal reflux disease)   . High cholesterol   . Hypertension   . MMT (medial meniscus tear)    right    Patient Active Problem List   Diagnosis Date Noted  . Bilateral primary osteoarthritis of knee 02/28/2019  . Trochanteric bursitis of left hip 10/06/2018  . S/P right knee arthroscopy 08/05/2018  . Morbid obesity ([facility]) 08/05/2018  . Body mass index 40.0-44.9, adult ([place]) 08/05/2018  . Chondromalacia, right knee   . Other meniscus derangements, posterior horn of medial meniscus, right knee   . Positive ANA (antinuclear antibody) 06/14/2018  . Chronic pain of right knee 06/14/2018  . Acute medial meniscus tear, right, initial encounter 06/14/2018    Past Surgical History:  Procedure Laterality Date  . CHOLECYSTECTOMY    . KNEE ARTHROSCOPY WITH MEDIAL MENISECTOMY Right 06/22/2018   Procedure: RIGHT KNEE ARTHROSCOPY WITH MEDIAL MENISCECTOMY, CHONDROPLASTY;  Surgeon: Leandrew Koyanagi, MD;   Location: [facility];  Service: Orthopedics;  Laterality: Right;  . SHOULDER ARTHROSCOPY     removal of bone spur  . TONSILLECTOMY    . TUBAL LIGATION       OB History   No obstetric history on file.      Home Medications    Prior to Admission medications   Medication Sig Start Date End Date Taking? Authorizing Provider  celecoxib (CELEBREX) 200 MG capsule TAKE 1 CAPSULE(200 MG) BY MOUTH TWICE DAILY 10/31/18   Leandrew Koyanagi, MD  diclofenac sodium (VOLTAREN) 1 % GEL Apply 2 g topically 4 (four) times daily. 07/08/18   Leandrew Koyanagi, MD  esomeprazole (NEXIUM) 20 MG capsule Take 20 mg by mouth 2 (two) times daily before a meal.    [provider]  HYDROcodone-acetaminophen (NORCO) 5-325 MG tablet Take 1 tablet by mouth 3 (three) times daily as needed for moderate pain. 08/05/18   Aundra Dubin, PA-C  lisinopril (PRINIVIL,ZESTRIL) 20 MG tablet Take 20 mg by mouth 2 (two) times daily. 02/14/18   [provider]  meclizine (ANTIVERT) 25 MG tablet Take 25 mg by mouth every 8 (eight) hours as needed for dizziness.    [provider]  meloxicam (MOBIC) 7.5 MG tablet TAKE 1 TABLET BY MOUTH DAILY AS NEEDED FOR PAIN 09/29/18   Aundra Dubin, PA-C  ondansetron (ZOFRAN) 4 MG tablet Take 1-2 tablets (4-8 mg total) by mouth every 8 (  eight) hours as needed for nausea or vomiting. 06/22/18   Leandrew Koyanagi, MD  oxybutynin (DITROPAN) 5 MG tablet Take 5 mg by mouth every evening. 02/14/18   [provider]  promethazine (PHENERGAN) 25 MG tablet Take 1 tablet (25 mg total) by mouth every 6 (six) hours as needed for nausea. 06/22/18   Leandrew Koyanagi, MD  senna-docusate (SENOKOT S) 8.6-50 MG tablet Take 1 tablet by mouth at bedtime as needed. 06/22/18   Leandrew Koyanagi, MD    Family History No family history on file.  Social History Social History   Tobacco Use  . Smoking status: Never Smoker  . Smokeless tobacco: Never Used  Substance Use Topics  .  Alcohol use: Yes    Comment: social  . Drug use: Never     Allergies   Patient has no known allergies.   Review of Systems Review of Systems  Constitutional: Negative for chills and fever.  Musculoskeletal: Positive for arthralgias and myalgias. Negative for joint swelling.  Skin: Negative for color change.  Neurological: Negative for weakness and numbness.     Physical Exam Updated Vital Signs BP (!) 189/73 (BP Location: Right Arm)   Pulse 77   Temp 98.3 F (36.8 C) (Oral)   Resp 16   Ht 5\' 1"  (1.549 m)   Wt 92.5 kg   SpO2 98%   BMI 38.55 kg/m   Physical Exam Vitals signs and nursing note reviewed.  Constitutional:      General: She is not in acute distress.    Appearance: She is not ill-appearing or toxic-appearing.  HENT:     Head: Normocephalic and atraumatic.  Cardiovascular:     Pulses:          Dorsalis pedis pulses are 2+ on the right side and 2+ on the left side.       Posterior tibial pulses are 2+ on the right side and 2+ on the left side.  Pulmonary:     Effort: Pulmonary effort is normal.  Musculoskeletal:     Comments: Lower extremities: No obvious deformity, appreciable swelling, edema, erythema, ecchymosis, warmth, or open wounds. Patient has intact AROM to bilateral hips, knees, ankles, and all digits. Tender to palpation over the posterior aspect of the L knee including popliteal fossa extending to the distal aspect of posterior upper leg. Tender over hamstring muscle group distally. No point/focal bony tenderness. Compartments are soft. NVI distally. Symmetric temperature to the extremities. Not cool to the touch.   Skin:    General: Skin is warm and dry.     Capillary Refill: Capillary refill takes less than 2 seconds.  Neurological:     Mental Status: She is alert.     Comments: Alert. Clear speech. Sensation grossly intact to bilateral lower extremities. 5/5 strength with knee flexion/extension & ankle plantar/dorsiflexion bilaterally. Patient  hesitant to bear weight on the LLE  Psychiatric:        Mood and Affect: Mood normal.        Behavior: Behavior normal.    ED Treatments / Results  Labs (all labs ordered are listed, but only abnormal results are displayed) Labs Reviewed - No data to display  EKG None  Radiology Ct Knee Left Wo Contrast  Result Date: 03/12/2019 CLINICAL DATA:  Acute posterior knee pain after feeling a pop while walking today. EXAM: CT OF THE LEFT KNEE WITHOUT CONTRAST TECHNIQUE: Multidetector CT imaging of the left knee was performed according to the standard protocol.  Multiplanar CT image reconstructions were also generated. COMPARISON:  Left knee x-rays from same day. FINDINGS: Bones/Joint/Cartilage No fracture or dislocation. Mild medial compartment joint space narrowing. Mild subchondral cystic change along the posterior nonweightbearing medial femoral condyle. Trace joint effusion. No Baker cyst. Ligaments Ligaments are suboptimally evaluated by CT. Muscles and Tendons Grossly intact.  No muscle atrophy. Soft tissue No fluid collection or hematoma.  No soft tissue mass. IMPRESSION: 1. No acute osseous abnormality. 2. Mild medial compartment osteoarthritis. Electronically Signed   By: Titus Dubin M.D.   On: 03/12/2019 16:39   Dg Knee Complete 4 Views Left  Result Date: 03/12/2019 CLINICAL DATA:  Popping pain in left knee, no trauma EXAM: LEFT KNEE - COMPLETE 4+ VIEW COMPARISON:  None. FINDINGS: No fracture or dislocation of the left knee. There is minimal medial and patellofemoral compartment joint space narrowing and osteophytosis. There is a small, nonspecific knee joint effusion. IMPRESSION: No fracture or dislocation of the left knee. There is minimal medial and patellofemoral compartment joint space narrowing and osteophytosis. There is a small, nonspecific knee joint effusion. Electronically Signed   By: Eddie Candle M.D.   On: 03/12/2019 15:19    Procedures Procedures (including critical care  time) SPLINT APPLICATION Date/Time: 7:42 PM Authorized by: Kennith Maes Consent: Verbal consent obtained. Risks and benefits: risks, benefits and alternatives were discussed Consent given by: patient Splint applied by: ED technician Location details: LLE Splint type: knee immoblizer Supplies used: knee immobilizer  Post-procedure: The splinted body part was neurovascularly unchanged following the procedure. Patient tolerance: Patient tolerated the procedure well with no immediate complications.    Medications Ordered in ED Medications  acetaminophen (TYLENOL) tablet 650 mg (650 mg Oral Given 03/12/19 1603)     Initial Impression / Assessment and Plan / ED Course  I have reviewed the triage vital signs and the nursing notes.  Pertinent labs & imaging results that were available during my care of the patient were reviewed by me and considered in my medical decision making (see chart for details).   Patient presents to the ED w/ complaints of LLE pain s/p popping sensation while ambulating shortly PTA. Nontoxic appearing, no apparent distress, vitals WNL with the exception of BP doubt HTN emergency. Exam w/o erythema/warmth/fever, do not suspect infectious etiology such as septic joint, osteomyelitis, or cellulitis. Sxs after popping sensation, no calf tenderness or edema, do not suspect DVT. NVI distally, strong pulses, symmetric temperature of extremities, do not suspect arterial injury. Xray w/ degenerative changes w/ small joint effusion, no fx/dislocation. Discussed w/ Dr. Lita Mains- recommended CT which was obtained & negative for acute osseous abnormality.  Considering muscular strain especially of hamstrings vs. Ligamentous injury vs. Meniscal injury. Will place in knee immobilizer and have patient follow up with her prior orthopedic doctor. Ambulatory in knee immobilizer. Diclofenac gel prescribed for pain. I discussed results, treatment plan, need for follow-up, and return  precautions with the patient. Provided opportunity for questions, patient confirmed understanding and is in agreement with plan.   Findings and plan of care discussed with supervising physician Dr. Lita Mains who is in agreement.     Final Clinical Impressions(s) / ED Diagnoses   Final diagnoses:  Pain of left lower extremity    ED Discharge Orders         Ordered    diclofenac sodium (VOLTAREN) 1 % GEL  4 times daily     03/12/19 1732           Caidyn Henricksen, Glynda Jaeger,  PA-C 03/12/19 1735    Julianne Rice, MD 03/12/19 2137

## 2019-03-12 NOTE — ED Notes (Signed)
BP elevated at this time.  Per patient has not taken bp meds yet but will take when she gets home.

## 2019-03-12 NOTE — Discharge Instructions (Addendum)
Please read and follow all provided instructions.  You have been seen today for pain to your left leg.   Tests performed today include: An x-ray & CT of the affected area - does NOT show any broken bones or dislocations the imaging does show osteoarthritis. Vital signs. See below for your results today.   Home care instructions: -- *PRICE in the first 24-48 hours after injury: Protect (with brace, splint, sling), if given by your provider Rest Ice- Do not apply ice pack directly to your skin, place towel or similar between your skin and ice/ice pack. Apply ice for 20 min, then remove for 40 min while awake Compression- Wear brace, elastic bandage, splint as directed by your provider Elevate affected extremity above the level of your heart when not walking around for the first 24-48 hours   Medications:  Voltaren gel-is a topical anti-inflammatory gel that you may apply directly to your area of pain up to 4 times per day as needed.  You make take Tylenol per over the counter dosing with these medications.   We have prescribed you new medication(s) today. Discuss the medications prescribed today with your pharmacist as they can have adverse effects and interactions with your other medicines including over the counter and prescribed medications. Seek medical evaluation if you start to experience new or abnormal symptoms after taking one of these medicines, seek care immediately if you start to experience difficulty breathing, feeling of your throat closing, facial swelling, or rash as these could be indications of a more serious allergic reaction  Follow-up instructions: Please follow-up with your primary care provider or the provided orthopedic physician (bone specialist) if you continue to have significant pain in 1 week. In this case you may have a more severe injury that requires further care.   Return instructions:  Please return if your digits or extremity are numb or tingling, appear  gray or blue, or you have severe pain (also elevate the extremity and loosen splint or wrap if you were given one) Please return if you have redness or fevers.  Please return to the Emergency Department if you experience worsening symptoms.  Please return if you have any other emergent concerns. Additional Information:  Your vital signs today were: BP (!) 189/73 (BP Location: Right Arm)    Pulse 77    Temp 98.3 F (36.8 C) (Oral)    Resp 16    Ht 5\' 1"  (1.549 m)    Wt 92.5 kg    SpO2 98%    BMI 38.55 kg/m  If your blood pressure (BP) was elevated above 135/85 this visit, please have this repeated by your doctor within one month. ---------------

## 2019-03-14 ENCOUNTER — Ambulatory Visit (INDEPENDENT_AMBULATORY_CARE_PROVIDER_SITE_OTHER): Payer: Medicare HMO | Admitting: Orthopaedic Surgery

## 2019-03-14 ENCOUNTER — Encounter: Payer: Self-pay | Admitting: Orthopaedic Surgery

## 2019-03-14 ENCOUNTER — Other Ambulatory Visit: Payer: Self-pay

## 2019-03-14 DIAGNOSIS — M1711 Unilateral primary osteoarthritis, right knee: Secondary | ICD-10-CM | POA: Diagnosis not present

## 2019-03-14 DIAGNOSIS — M5432 Sciatica, left side: Secondary | ICD-10-CM | POA: Diagnosis not present

## 2019-03-14 MED ORDER — PREDNISONE 10 MG (21) PO TBPK: ORAL_TABLET | ORAL | 0 refills | Status: DC

## 2019-03-14 MED ORDER — CYCLOBENZAPRINE HCL 5 MG PO TABS: 5.0000 mg | ORAL_TABLET | Freq: Three times a day (TID) | ORAL | 3 refills | Status: DC | PRN

## 2019-03-14 NOTE — Progress Notes (Addendum)
Office Visit Note   Patient: Shawna Thompson           Date of Birth: [birth year]           MRN: [id number] Visit Date: 03/14/2019              Requested by: Nolene Ebbs, MD [address] [place],  [place] [address] PCP: Nolene Ebbs, MD   Assessment & Plan: Visit Diagnoses:  1. Sciatica, left side   2. Primary osteoarthritis of right knee     Plan: Impression is left leg pain that has resolved.  I suspect that this may have been a brief episode of sciatica.  Fortunately this has improved on its own.  I did give her prescription for prednisone and Flexeril in case it happens again in the future.  Questions encouraged and answered.  Follow-up as needed.  Needs custom brace due to thigh to calf ratio disporportionate.  Follow-Up Instructions: Return if symptoms worsen or fail to improve.   Orders:  No orders of the defined types were placed in this encounter.  Meds ordered this encounter  Medications  . predniSONE (STERAPRED UNI-PAK 21 TAB) 10 MG (21) TBPK tablet    Sig: Take as directed    Dispense:  21 tablet    Refill:  0  . cyclobenzaprine (FLEXERIL) 5 MG tablet    Sig: Take 1-2 tablets (5-10 mg total) by mouth 3 (three) times daily as needed for muscle spasms.    Dispense:  30 tablet    Refill:  3      Procedures: No procedures performed   Clinical Data: No additional findings.   Subjective: Chief Complaint  Patient presents with  . Left Leg - Pain  . Left Knee - Pain    Shawna Thompson is coming in today for evaluation of acute onset of left posterior leg pain from this weekend.  She has since felt much better and is doing well.  She went to the ER to be evaluated and x-rays and CTs were negative.  She denies any back pain.  Denies any knee pain.  She states the pain starts in the buttock region and radiates all the way down to her toes.   Review of Systems  Constitutional: Negative.   HENT: Negative.   Eyes: Negative.   Respiratory: Negative.    Cardiovascular: Negative.   Endocrine: Negative.   Musculoskeletal: Negative.   Neurological: Negative.   Hematological: Negative.   Psychiatric/Behavioral: Negative.   All other systems reviewed and are negative.    Objective: Vital Signs: There were no vitals taken for this visit.  Physical Exam Vitals signs and nursing note reviewed.  Constitutional:      Appearance: She is well-developed.  HENT:     Head: Normocephalic and atraumatic.  Neck:     Musculoskeletal: Neck supple.  Pulmonary:     Effort: Pulmonary effort is normal.  Abdominal:     Palpations: Abdomen is soft.  Skin:    General: Skin is warm.     Capillary Refill: Capillary refill takes less than 2 seconds.  Neurological:     Mental Status: She is alert and oriented to person, place, and time.  Psychiatric:        Behavior: Behavior normal.        Thought Content: Thought content normal.        Judgment: Judgment normal.     Ortho Exam    Left lower extremity exam shows negative sciatic tension signs.  Negative Faber.  Hip and ankle exams are unremarkable.  Lateral hip is nontender. Right knee exam shows instability with varus/valgus.  Specialty Comments:  No specialty comments available.  Imaging: No results found.   PMFS History: Patient Active Problem List   Diagnosis Date Noted  . Bilateral primary osteoarthritis of knee 02/28/2019  . Trochanteric bursitis of left hip 10/06/2018  . S/P right knee arthroscopy 08/05/2018  . Morbid obesity ([place]) 08/05/2018  . Body mass index 40.0-44.9, adult ([place]) 08/05/2018  . Chondromalacia, right knee   . Other meniscus derangements, posterior horn of medial meniscus, right knee   . Positive ANA (antinuclear antibody) 06/14/2018  . Chronic pain of right knee 06/14/2018  . Acute medial meniscus tear, right, initial encounter 06/14/2018   Past Medical History:  Diagnosis Date  . Arthritis   . GERD (gastroesophageal reflux disease)   . High  cholesterol   . Hypertension   . MMT (medial meniscus tear)    right    History reviewed. No pertinent family history.  Past Surgical History:  Procedure Laterality Date  . CHOLECYSTECTOMY    . KNEE ARTHROSCOPY WITH MEDIAL MENISECTOMY Right 06/22/2018   Procedure: RIGHT KNEE ARTHROSCOPY WITH MEDIAL MENISCECTOMY, CHONDROPLASTY;  Surgeon: Leandrew Koyanagi, MD;  Location: [facility];  Service: Orthopedics;  Laterality: Right;  . SHOULDER ARTHROSCOPY     removal of bone spur  . TONSILLECTOMY    . TUBAL LIGATION     Social History   Occupational History  . Not on file  Tobacco Use  . Smoking status: Never Smoker  . Smokeless tobacco: Never Used  Substance and Sexual Activity  . Alcohol use: Yes    Comment: social  . Drug use: Never  . Sexual activity: Not on file

## 2019-03-23 ENCOUNTER — Other Ambulatory Visit: Payer: Self-pay

## 2019-03-23 ENCOUNTER — Emergency Department (HOSPITAL_COMMUNITY)
Admission: EM | Admit: 2019-03-23 | Discharge: 2019-03-23 | Disposition: A | Discharge status: Home / Self Care | Payer: Medicare HMO | Attending: Emergency Medicine | Admitting: Emergency Medicine

## 2019-03-23 ENCOUNTER — Emergency Department (HOSPITAL_COMMUNITY): Payer: Medicare HMO

## 2019-03-23 ENCOUNTER — Encounter (HOSPITAL_COMMUNITY): Payer: Self-pay | Admitting: Emergency Medicine

## 2019-03-23 DIAGNOSIS — I10 Essential (primary) hypertension: Secondary | ICD-10-CM | POA: Diagnosis not present

## 2019-03-23 DIAGNOSIS — J4521 Mild intermittent asthma with (acute) exacerbation: Secondary | ICD-10-CM | POA: Insufficient documentation

## 2019-03-23 DIAGNOSIS — Z79899 Other long term (current) drug therapy: Secondary | ICD-10-CM | POA: Diagnosis not present

## 2019-03-23 DIAGNOSIS — T59811A Toxic effect of smoke, accidental (unintentional), initial encounter: Secondary | ICD-10-CM

## 2019-03-23 DIAGNOSIS — X088XXA Exposure to other specified smoke, fire and flames, initial encounter: Secondary | ICD-10-CM | POA: Insufficient documentation

## 2019-03-23 DIAGNOSIS — R079 Chest pain, unspecified: Secondary | ICD-10-CM | POA: Diagnosis present

## 2019-03-23 LAB — CBC WITH DIFFERENTIAL/PLATELET
Abs Immature Granulocytes: 0.02 10*3/uL (ref 0.00–0.07)
Basophils Absolute: 0.1 10*3/uL (ref 0.0–0.1)
Basophils Relative: 1 %
Eosinophils Absolute: 0.2 10*3/uL (ref 0.0–0.5)
Eosinophils Relative: 2 %
HCT: 40.3 % (ref 36.0–46.0)
Hemoglobin: 12.4 g/dL (ref 12.0–15.0)
Immature Granulocytes: 0 %
Lymphocytes Relative: 34 %
Lymphs Abs: 3.2 10*3/uL (ref 0.7–4.0)
MCH: 25 pg — ABNORMAL LOW (ref 26.0–34.0)
MCHC: 30.8 g/dL (ref 30.0–36.0)
MCV: 81.3 fL (ref 80.0–100.0)
Monocytes Absolute: 0.5 10*3/uL (ref 0.1–1.0)
Monocytes Relative: 5 %
Neutro Abs: 5.6 10*3/uL (ref 1.7–7.7)
Neutrophils Relative %: 58 %
Platelets: 266 10*3/uL (ref 150–400)
RBC: 4.96 MIL/uL (ref 3.87–5.11)
RDW: 14.9 % (ref 11.5–15.5)
WBC: 9.6 10*3/uL (ref 4.0–10.5)
nRBC: 0 % (ref 0.0–0.2)

## 2019-03-23 LAB — COMPREHENSIVE METABOLIC PANEL
ALT: 13 U/L (ref 0–44)
AST: 17 U/L (ref 15–41)
Albumin: 3.6 g/dL (ref 3.5–5.0)
Alkaline Phosphatase: 85 U/L (ref 38–126)
Anion gap: 10 (ref 5–15)
BUN: 18 mg/dL (ref 8–23)
CO2: 21 mmol/L — ABNORMAL LOW (ref 22–32)
Calcium: 8.8 mg/dL — ABNORMAL LOW (ref 8.9–10.3)
Chloride: 107 mmol/L (ref 98–111)
Creatinine, Ser: 1.01 mg/dL — ABNORMAL HIGH (ref 0.44–1.00)
GFR calc Af Amer: >60 mL/min (ref >60)
GFR calc non Af Amer: 56 mL/min — ABNORMAL LOW (ref >60)
Glucose, Bld: 100 mg/dL — ABNORMAL HIGH (ref 70–99)
Potassium: 3.9 mmol/L (ref 3.5–5.1)
Sodium: 138 mmol/L (ref 135–145)
Total Bilirubin: 0.7 mg/dL (ref 0.3–1.2)
Total Protein: 6.2 g/dL — ABNORMAL LOW (ref 6.5–8.1)

## 2019-03-23 LAB — COOXEMETRY PANEL
Carboxyhemoglobin: 1 % (ref 0.5–1.5)
Methemoglobin: 1 % (ref 0.0–1.5)
O2 Saturation: 85.2 %
Total hemoglobin: 12 g/dL (ref 12.0–16.0)

## 2019-03-23 LAB — TROPONIN I: Troponin I: <0.03 ng/mL (ref <0.03)

## 2019-03-23 MED ORDER — AEROCHAMBER PLUS FLO-VU LARGE MISC
1.0000 | Freq: Once | Status: AC
  Administered 2019-03-23: 1

## 2019-03-23 MED ORDER — ALBUTEROL SULFATE HFA 108 (90 BASE) MCG/ACT IN AERS
4.0000 | INHALATION_SPRAY | Freq: Once | RESPIRATORY_TRACT | Status: AC
  Administered 2019-03-23: 4 via RESPIRATORY_TRACT
  Filled 2019-03-23: qty 6.7
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
## 2019-04-04 ENCOUNTER — Telehealth: Payer: Self-pay

## 2019-04-04 NOTE — Telephone Encounter (Signed)
Submitted VOB for Monovisc, bilateral knee. 

## 2019-04-10 ENCOUNTER — Telehealth: Payer: Self-pay

## 2019-04-10 NOTE — Telephone Encounter (Signed)
Called and left a VM advising patient to CB to schedule an appointment with Dr. Erlinda Hong for gel injection.  Approved for Monovisc, bilateral knee. [place] Patient will be responsible for 20% of the allowable amount. Co-pay of $45.00 required No PA required

## 2019-04-18 ENCOUNTER — Ambulatory Visit (INDEPENDENT_AMBULATORY_CARE_PROVIDER_SITE_OTHER): Payer: Medicare HMO | Admitting: Orthopaedic Surgery

## 2019-04-18 ENCOUNTER — Encounter: Payer: Self-pay | Admitting: Orthopaedic Surgery

## 2019-04-18 ENCOUNTER — Other Ambulatory Visit: Payer: Self-pay

## 2019-04-18 DIAGNOSIS — M170 Bilateral primary osteoarthritis of knee: Secondary | ICD-10-CM | POA: Diagnosis not present

## 2019-04-18 MED ORDER — BUPIVACAINE HCL 0.25 % IJ SOLN
0.6600 mL | INTRAMUSCULAR | Status: AC | PRN
  Administered 2019-04-18: .66 mL via INTRA_ARTICULAR

## 2019-04-18 MED ORDER — LIDOCAINE HCL 1 % IJ SOLN
0.5000 mL | INTRAMUSCULAR | Status: AC | PRN
  Administered 2019-04-18: .5 mL

## 2019-04-18 MED ORDER — HYALURONAN 88 MG/4ML IX SOSY
88.0000 mg | PREFILLED_SYRINGE | INTRA_ARTICULAR | Status: AC | PRN
  Administered 2019-04-18: 88 mg via INTRA_ARTICULAR

## 2019-04-18 NOTE — Progress Notes (Signed)
   Procedure Note  Patient: Shawna Thompson             Date of Birth: 1946/04/17           MRN: [id number]             Visit Date: 04/18/2019  Procedures: Visit Diagnoses:  1. Bilateral primary osteoarthritis of knee     Large Joint Inj: bilateral knee on 04/18/2019 4:20 PM Indications: pain Details: 22 G needle, anterolateral approach Medications (Right): 0.66 mL bupivacaine 0.25 %; 88 mg Hyaluronan 88 MG/4ML; 0.5 mL lidocaine 1 % Medications (Left): 0.66 mL bupivacaine 0.25 %; 88 mg Hyaluronan 88 MG/4ML; 0.5 mL lidocaine 1 %

## 2019-05-24 ENCOUNTER — Other Ambulatory Visit: Payer: Self-pay | Admitting: Internal Medicine

## 2019-05-24 DIAGNOSIS — Z1231 Encounter for screening mammogram for malignant neoplasm of breast: Secondary | ICD-10-CM

## 2019-05-24 DIAGNOSIS — E2839 Other primary ovarian failure: Secondary | ICD-10-CM

## 2019-07-18 ENCOUNTER — Ambulatory Visit (INDEPENDENT_AMBULATORY_CARE_PROVIDER_SITE_OTHER): Payer: Medicare HMO

## 2019-07-18 ENCOUNTER — Ambulatory Visit (INDEPENDENT_AMBULATORY_CARE_PROVIDER_SITE_OTHER): Payer: Medicare HMO | Admitting: Orthopaedic Surgery

## 2019-07-18 ENCOUNTER — Other Ambulatory Visit: Payer: Self-pay

## 2019-07-18 ENCOUNTER — Encounter: Payer: Self-pay | Admitting: Orthopaedic Surgery

## 2019-07-18 DIAGNOSIS — M1711 Unilateral primary osteoarthritis, right knee: Secondary | ICD-10-CM

## 2019-07-18 MED ORDER — LIDOCAINE HCL 1 % IJ SOLN
2.0000 mL | INTRAMUSCULAR | Status: AC | PRN
  Administered 2019-07-18: 2 mL

## 2019-07-18 MED ORDER — METHYLPREDNISOLONE ACETATE 40 MG/ML IJ SUSP
40.0000 mg | INTRAMUSCULAR | Status: AC | PRN
  Administered 2019-07-18: 40 mg via INTRA_ARTICULAR

## 2019-07-18 MED ORDER — BUPIVACAINE HCL 0.25 % IJ SOLN
2.0000 mL | INTRAMUSCULAR | Status: AC | PRN
  Administered 2019-07-18: 16:00:00 2 mL via INTRA_ARTICULAR

## 2019-07-18 NOTE — Progress Notes (Signed)
Office Visit Note   Patient: Shawna Thompson           Date of Birth: [birth year]           MRN: [id number] Visit Date: 07/18/2019              Requested by: Nolene Ebbs, MD [address] [place],  [place] [address] PCP: Nolene Ebbs, MD   Assessment & Plan: Visit Diagnoses:  1. Unilateral primary osteoarthritis, right knee     Plan: Impression is end-stage degenerative joint disease right knee.  Although the patient has not had long-lasting relief from previous injections, she would like to repeat one more cortisone injection.  We have agreed to do this today.  Have also provided her with a handout on total knee arthroplasty.   Follow-Up Instructions: Return if symptoms worsen or fail to improve.   Orders:  Orders Placed This Encounter  Procedures  . Large Joint Inj: R knee  . XR Knee Complete 4 Views Right   No orders of the defined types were placed in this encounter.     Procedures: Large Joint Inj: R knee on 07/18/2019 3:52 PM Indications: pain Details: 22 G needle, anterolateral approach Medications: 2 mL bupivacaine 0.25 %; 2 mL lidocaine 1 %; 40 mg methylPREDNISolone acetate 40 MG/ML      Clinical Data: No additional findings.   Subjective: Chief Complaint  Patient presents with  . Right Knee - Pain, Follow-up    HPI patient is a pleasant 73 year old female who presents our clinic today with recurrent right knee pain.  History of right knee osteoarthritis.  She has been seen by [place] several times in the past where she has had cortisone and viscosupplementation injections.  Her last cortisone injection was 02/28/2019 and her last discussed augmentation injection was on 04/18/2019.  She noticed mild relief of symptoms following each injection.  The pain she is having is primarily to the medial compartment.  Worse with walking or standing.  She denies any pain at night.  She would like another cortisone injection to the right knee.  Review of Systems as  detailed in HPI.  All others reviewed and are negative.   Objective: Vital Signs: There were no vitals taken for this visit.  Physical Exam well-developed and well-nourished female in no acute distress.  Alert and oriented x3.  Ortho Exam examination of the right knee shows trace effusion.  Medial joint line tenderness.  Mild to moderate patellofemoral crepitus.  Range of motion 0-115 degrees.  Stable to valgus and varus stress.  Specialty Comments:  No specialty comments available.  Imaging: Xr Knee Complete 4 Views Right  Result Date: 07/18/2019 X-rays demonstrate moderate degenerative changes medial and patellofemoral compartments    PMFS History: Patient Active Problem List   Diagnosis Date Noted  . Bilateral primary osteoarthritis of knee 02/28/2019  . Trochanteric bursitis of left hip 10/06/2018  . S/P right knee arthroscopy 08/05/2018  . Morbid obesity ([place]) 08/05/2018  . Body mass index 40.0-44.9, adult ([place]) 08/05/2018  . Chondromalacia, right knee   . Other meniscus derangements, posterior horn of medial meniscus, right knee   . Positive ANA (antinuclear antibody) 06/14/2018  . Chronic pain of right knee 06/14/2018  . Acute medial meniscus tear, right, initial encounter 06/14/2018   Past Medical History:  Diagnosis Date  . Arthritis   . GERD (gastroesophageal reflux disease)   . High cholesterol   . Hypertension   . MMT (medial meniscus tear)  right    History reviewed. No pertinent family history.  Past Surgical History:  Procedure Laterality Date  . CHOLECYSTECTOMY    . KNEE ARTHROSCOPY WITH MEDIAL MENISECTOMY Right 06/22/2018   Procedure: RIGHT KNEE ARTHROSCOPY WITH MEDIAL MENISCECTOMY, CHONDROPLASTY;  Surgeon: Leandrew Koyanagi, MD;  Location: [facility];  Service: Orthopedics;  Laterality: Right;  . SHOULDER ARTHROSCOPY     removal of bone spur  . TONSILLECTOMY    . TUBAL LIGATION     Social History   Occupational History  . Not on  file  Tobacco Use  . Smoking status: Never Smoker  . Smokeless tobacco: Never Used  Substance and Sexual Activity  . Alcohol use: Yes    Comment: social  . Drug use: Never  . Sexual activity: Not on file

## 2019-08-24 ENCOUNTER — Ambulatory Visit
Admission: RE | Admit: 2019-08-24 | Discharge: 2019-08-24 | Disposition: A | Discharge status: Home / Self Care | Payer: Medicare HMO | Source: Ambulatory Visit | Attending: Internal Medicine | Admitting: Internal Medicine

## 2019-08-24 ENCOUNTER — Other Ambulatory Visit: Payer: Self-pay

## 2019-08-24 ENCOUNTER — Other Ambulatory Visit: Payer: Medicare HMO

## 2019-08-24 DIAGNOSIS — Z1231 Encounter for screening mammogram for malignant neoplasm of breast: Secondary | ICD-10-CM

## 2019-11-07 ENCOUNTER — Ambulatory Visit: Payer: Medicare HMO | Admitting: Orthopaedic Surgery

## 2019-11-07 ENCOUNTER — Encounter: Payer: Self-pay | Admitting: Orthopaedic Surgery

## 2019-11-07 ENCOUNTER — Other Ambulatory Visit: Payer: Self-pay

## 2019-11-07 DIAGNOSIS — M79661 Pain in right lower leg: Secondary | ICD-10-CM

## 2019-11-07 NOTE — Progress Notes (Signed)
Office Visit Note   Patient: Shawna Thompson           Date of Birth: 01-27-1946           MRN: [id number] Visit Date: 11/07/2019              Requested by: Nolene Ebbs, MD [address] [place],  [place] [address] PCP: Nolene Ebbs, MD   Assessment & Plan: Visit Diagnoses:  1. Right calf pain     Plan: Impression is right calf pain with low suspicion for DVT.  At this point, I believe she is likely getting referred pain from her knee OA versus possible lumbar spine radiculopathy.  However, I do believe it is appropriate to proceed with the venous Doppler ultrasound right lower extremity to rule out DVT.  She will follow-up with [place] as needed.  Follow-Up Instructions: Return if symptoms worsen or fail to improve.   Orders:  No orders of the defined types were placed in this encounter.  No orders of the defined types were placed in this encounter.     Procedures: No procedures performed   Clinical Data: No additional findings.   Subjective: Chief Complaint  Patient presents with  . Right Leg - Pain    HPI patient is a pleasant 74 year old female who comes in today with right lower leg pain.  This has been ongoing for the past week.  Majority of her pain is to the medial calf.  She has noted moderate swelling which has slightly improved over the past few days.  She has also noticed some numbness to her foot.  No chest pain no shortness of breath.  She is a non-smoker.  No personal or family history of DVT or PE.  She does have a history of advanced degenerative changes to the right knee.  Review of Systems as detailed in HPI.  All others reviewed and are negative.   Objective: Vital Signs: There were no vitals taken for this visit.  Physical Exam well-developed well-nourished female no acute distress.  Alert and oriented x3.  Ortho Exam examination of the right leg leg reveals moderate tenderness the medial calf.  Negative Homans.  Mild swelling.  She is  neurovascular intact distally.  Specialty Comments:  No specialty comments available.  Imaging: No new imaging   PMFS History: Patient Active Problem List   Diagnosis Date Noted  . Bilateral primary osteoarthritis of knee 02/28/2019  . Trochanteric bursitis of left hip 10/06/2018  . S/P right knee arthroscopy 08/05/2018  . Morbid obesity ([place]) 08/05/2018  . Body mass index 40.0-44.9, adult ([place]) 08/05/2018  . Chondromalacia, right knee   . Other meniscus derangements, posterior horn of medial meniscus, right knee   . Positive ANA (antinuclear antibody) 06/14/2018  . Chronic pain of right knee 06/14/2018  . Acute medial meniscus tear, right, initial encounter 06/14/2018   Past Medical History:  Diagnosis Date  . Arthritis   . GERD (gastroesophageal reflux disease)   . High cholesterol   . Hypertension   . MMT (medial meniscus tear)    right    History reviewed. No pertinent family history.  Past Surgical History:  Procedure Laterality Date  . CHOLECYSTECTOMY    . KNEE ARTHROSCOPY WITH MEDIAL MENISECTOMY Right 06/22/2018   Procedure: RIGHT KNEE ARTHROSCOPY WITH MEDIAL MENISCECTOMY, CHONDROPLASTY;  Surgeon: Leandrew Koyanagi, MD;  Location: [facility];  Service: Orthopedics;  Laterality: Right;  . SHOULDER ARTHROSCOPY     removal of bone  spur  . TONSILLECTOMY    . TUBAL LIGATION     Social History   Occupational History  . Not on file  Tobacco Use  . Smoking status: Never Smoker  . Smokeless tobacco: Never Used  Substance and Sexual Activity  . Alcohol use: Yes    Comment: social  . Drug use: Never  . Sexual activity: Not on file

## 2019-11-08 ENCOUNTER — Other Ambulatory Visit: Payer: Self-pay

## 2019-11-08 DIAGNOSIS — M79661 Pain in right lower leg: Secondary | ICD-10-CM

## 2019-11-13 ENCOUNTER — Encounter (HOSPITAL_COMMUNITY): Payer: Medicare HMO

## 2019-11-13 ENCOUNTER — Other Ambulatory Visit: Payer: Self-pay

## 2019-11-13 ENCOUNTER — Ambulatory Visit (HOSPITAL_COMMUNITY)
Admission: RE | Admit: 2019-11-13 | Discharge: 2019-11-13 | Disposition: A | Discharge status: Home / Self Care | Payer: Medicare HMO | Source: Ambulatory Visit | Attending: Physician Assistant | Admitting: Physician Assistant

## 2019-11-13 DIAGNOSIS — M79661 Pain in right lower leg: Secondary | ICD-10-CM | POA: Diagnosis not present

## 2019-11-13 NOTE — Progress Notes (Signed)
Right lower extremity venous duplex exam performed.  Preliminary results can be found under CV proc under chart review.  11/13/2019 4:02 PM  Kateria Cutrona, K., RDMS, RVT

## 2019-11-14 ENCOUNTER — Ambulatory Visit
Admission: RE | Admit: 2019-11-14 | Discharge: 2019-11-14 | Disposition: A | Discharge status: Home / Self Care | Payer: Medicare HMO | Source: Ambulatory Visit | Attending: Internal Medicine | Admitting: Internal Medicine

## 2019-11-14 DIAGNOSIS — E2839 Other primary ovarian failure: Secondary | ICD-10-CM

## 2019-11-30 ENCOUNTER — Other Ambulatory Visit: Payer: Self-pay | Admitting: Internal Medicine

## 2019-11-30 DIAGNOSIS — N644 Mastodynia: Secondary | ICD-10-CM

## 2019-12-08 ENCOUNTER — Other Ambulatory Visit: Payer: Self-pay

## 2019-12-08 ENCOUNTER — Ambulatory Visit: Payer: Medicare HMO

## 2019-12-08 ENCOUNTER — Ambulatory Visit
Admission: RE | Admit: 2019-12-08 | Discharge: 2019-12-08 | Disposition: A | Discharge status: Home / Self Care | Payer: Medicare HMO | Source: Ambulatory Visit | Attending: Internal Medicine | Admitting: Internal Medicine

## 2019-12-08 DIAGNOSIS — N644 Mastodynia: Secondary | ICD-10-CM

## 2019-12-11 ENCOUNTER — Other Ambulatory Visit: Payer: Self-pay

## 2019-12-11 ENCOUNTER — Ambulatory Visit (INDEPENDENT_AMBULATORY_CARE_PROVIDER_SITE_OTHER): Payer: Medicare HMO | Admitting: Orthopaedic Surgery

## 2019-12-11 ENCOUNTER — Encounter: Payer: Self-pay | Admitting: Orthopaedic Surgery

## 2019-12-11 ENCOUNTER — Telehealth: Payer: Self-pay

## 2019-12-11 VITALS — Ht 61.0 in | Wt 217.0 lb

## 2019-12-11 DIAGNOSIS — M1710 Unilateral primary osteoarthritis, unspecified knee: Secondary | ICD-10-CM

## 2019-12-11 DIAGNOSIS — Z6841 Body Mass Index (BMI) 40.0 and over, adult: Secondary | ICD-10-CM | POA: Diagnosis not present

## 2019-12-11 DIAGNOSIS — M1711 Unilateral primary osteoarthritis, right knee: Secondary | ICD-10-CM | POA: Diagnosis not present

## 2019-12-11 DIAGNOSIS — M1712 Unilateral primary osteoarthritis, left knee: Secondary | ICD-10-CM | POA: Diagnosis not present

## 2019-12-11 MED ORDER — METHYLPREDNISOLONE ACETATE 40 MG/ML IJ SUSP
40.0000 mg | INTRAMUSCULAR | Status: AC | PRN
  Administered 2019-12-11: 15:00:00 40 mg via INTRA_ARTICULAR

## 2019-12-11 MED ORDER — LIDOCAINE HCL 1 % IJ SOLN
2.0000 mL | INTRAMUSCULAR | Status: AC | PRN
  Administered 2019-12-11: 15:00:00 2 mL

## 2019-12-11 MED ORDER — BUPIVACAINE HCL 0.5 % IJ SOLN
2.0000 mL | INTRAMUSCULAR | Status: AC | PRN
  Administered 2019-12-11: 2 mL via INTRA_ARTICULAR

## 2019-12-11 MED ORDER — BUPIVACAINE HCL 0.5 % IJ SOLN
2.0000 mL | INTRAMUSCULAR | Status: AC | PRN
  Administered 2019-12-11: 15:00:00 2 mL via INTRA_ARTICULAR

## 2019-12-11 NOTE — Progress Notes (Signed)
Office Visit Note   Patient: Shawna Thompson           Date of Birth: 20-Mar-1946           MRN: [id number] Visit Date: 12/11/2019              Requested by: Nolene Ebbs, MD [address] [place],  [place] [address] PCP: Nolene Ebbs, MD   Assessment & Plan: Visit Diagnoses:  1. Primary osteoarthritis of right knee   2. Primary osteoarthritis of left knee   3. Body mass index 40.0-44.9, adult ([place])   4. Morbid obesity ([place])     Plan: Impression is severe right knee DJD.  Moderate left knee DJD.  Cortisone injections repeated in both knees today.  We will bring her back in about a month or so for viscosupplementation injections.  She knows that she needs to lose weight before she is a candidate for right knee replacement.  Questions encouraged and answered. Total face to face encounter time was greater than 25 minutes and over half of this time was spent in counseling and/or coordination of care.  Follow-Up Instructions: Return if symptoms worsen or fail to improve.   Orders:  Orders Placed This Encounter  Procedures  . Large Joint Inj   No orders of the defined types were placed in this encounter.     Procedures: Large Joint Inj: bilateral knee on 12/11/2019 2:41 PM Indications: pain Details: 22 G needle  Arthrogram: No  Medications (Right): 2 mL lidocaine 1 %; 2 mL bupivacaine 0.5 %; 40 mg methylPREDNISolone acetate 40 MG/ML Medications (Left): 2 mL lidocaine 1 %; 2 mL bupivacaine 0.5 %; 40 mg methylPREDNISolone acetate 40 MG/ML Outcome: tolerated well, no immediate complications Patient was prepped and draped in the usual sterile fashion.       Clinical Data: No additional findings.   Subjective: Chief Complaint  Patient presents with  . Right Knee - Pain  . Left Knee - Pain    Shawna Thompson returns today for follow-up of bilateral knee pain due to DJD.  The pain is severe in her right knee.  She is status post right knee cortisone injection October  2020, left knee cortisone injection May 2020, bilateral viscosupplementation injection July 2020.  She has had a few months of relief with each injection.  She is still trying to hold off the knee replacement.   Review of Systems  Constitutional: Negative.   HENT: Negative.   Eyes: Negative.   Respiratory: Negative.   Cardiovascular: Negative.   Endocrine: Negative.   Musculoskeletal: Negative.   Neurological: Negative.   Hematological: Negative.   Psychiatric/Behavioral: Negative.   All other systems reviewed and are negative.    Objective: Vital Signs: Ht 5\' 1"  (1.549 m)   Wt 217 lb (98.4 kg)   BMI 41.00 kg/m   Physical Exam Vitals and nursing note reviewed.  Constitutional:      Appearance: She is well-developed.  Pulmonary:     Effort: Pulmonary effort is normal.  Skin:    General: Skin is warm.     Capillary Refill: Capillary refill takes less than 2 seconds.  Neurological:     Mental Status: She is alert and oriented to person, place, and time.  Psychiatric:        Behavior: Behavior normal.        Thought Content: Thought content normal.        Judgment: Judgment normal.     Ortho Exam Bilateral knee exam shows  moderate pain with range of motion.  Slight limitation range of motion.  Concentration is stable.  No joint effusion. Specialty Comments:  No specialty comments available.  Imaging: No results found.   PMFS History: Patient Active Problem List   Diagnosis Date Noted  . Primary osteoarthritis of right knee 12/11/2019  . Primary osteoarthritis of left knee 12/11/2019  . Bilateral primary osteoarthritis of knee 02/28/2019  . Trochanteric bursitis of left hip 10/06/2018  . S/P right knee arthroscopy 08/05/2018  . Morbid obesity ([place]) 08/05/2018  . Body mass index 40.0-44.9, adult ([place]) 08/05/2018  . Chondromalacia, right knee   . Other meniscus derangements, posterior horn of medial meniscus, right knee   . Positive ANA (antinuclear antibody)  06/14/2018  . Chronic pain of right knee 06/14/2018  . Acute medial meniscus tear, right, initial encounter 06/14/2018   Past Medical History:  Diagnosis Date  . Arthritis   . GERD (gastroesophageal reflux disease)   . High cholesterol   . Hypertension   . MMT (medial meniscus tear)    right    History reviewed. No pertinent family history.  Past Surgical History:  Procedure Laterality Date  . CHOLECYSTECTOMY    . KNEE ARTHROSCOPY WITH MEDIAL MENISECTOMY Right 06/22/2018   Procedure: RIGHT KNEE ARTHROSCOPY WITH MEDIAL MENISCECTOMY, CHONDROPLASTY;  Surgeon: Leandrew Koyanagi, MD;  Location: [facility];  Service: Orthopedics;  Laterality: Right;  . SHOULDER ARTHROSCOPY     removal of bone spur  . TONSILLECTOMY    . TUBAL LIGATION     Social History   Occupational History  . Not on file  Tobacco Use  . Smoking status: Never Smoker  . Smokeless tobacco: Never Used  Substance and Sexual Activity  . Alcohol use: Yes    Comment: social  . Drug use: Never  . Sexual activity: Not on file

## 2019-12-11 NOTE — Telephone Encounter (Signed)
Please precert for bilateral monovisc injections. This is Dr.Xu's patient.  Thanks!

## 2019-12-11 NOTE — Telephone Encounter (Signed)
Last injection 04/18/19. 6 Months= 10/19/19. Dante for repeat

## 2019-12-12 ENCOUNTER — Telehealth: Payer: Self-pay

## 2019-12-12 NOTE — Telephone Encounter (Signed)
Lvm for pt to call back to schedule °

## 2019-12-12 NOTE — Telephone Encounter (Signed)
Submitted for VOB for Monovisc-Bilateral knee  

## 2019-12-12 NOTE — Telephone Encounter (Signed)
I spoke with the pt. She wants to see if the cortisone will help prior to scheduling. She will call back if she starts hurting again

## 2019-12-12 NOTE — Telephone Encounter (Signed)
Approved for Monovisc-Bilateral knee Dr. Frederik Pear and Bill $25 copay 20% OOP Prior auth required with [employer] auth # [id number] Dates: 12/12/19-05/13/20

## 2020-01-25 ENCOUNTER — Telehealth: Payer: Self-pay | Admitting: Orthopaedic Surgery

## 2020-01-25 NOTE — Telephone Encounter (Signed)
Patient called.   She wants to get set up for a gel injection.   Call back: [phone]

## 2020-01-25 NOTE — Telephone Encounter (Signed)
Called and schedule pt. She is aware of copay and to only bring card or check

## 2020-01-30 ENCOUNTER — Ambulatory Visit: Payer: Medicare HMO | Admitting: Orthopaedic Surgery

## 2020-01-30 ENCOUNTER — Encounter: Payer: Self-pay | Admitting: Orthopaedic Surgery

## 2020-01-30 ENCOUNTER — Other Ambulatory Visit: Payer: Self-pay

## 2020-01-30 DIAGNOSIS — M170 Bilateral primary osteoarthritis of knee: Secondary | ICD-10-CM | POA: Diagnosis not present

## 2020-01-30 MED ORDER — LIDOCAINE HCL 1 % IJ SOLN
3.0000 mL | INTRAMUSCULAR | Status: AC | PRN
  Administered 2020-01-30: 3 mL

## 2020-01-30 MED ORDER — HYALURONAN 88 MG/4ML IX SOSY
88.0000 mg | PREFILLED_SYRINGE | INTRA_ARTICULAR | Status: AC | PRN
  Administered 2020-01-30: 88 mg via INTRA_ARTICULAR

## 2020-01-30 MED ORDER — BUPIVACAINE HCL 0.25 % IJ SOLN
0.6600 mL | INTRAMUSCULAR | Status: AC | PRN
  Administered 2020-01-30: .66 mL via INTRA_ARTICULAR

## 2020-01-30 NOTE — Progress Notes (Signed)
   Procedure Note  Patient: Shawna Thompson             Date of Birth: 1946-09-05           MRN: [id number]             Visit Date: 01/30/2020  Procedures: Visit Diagnoses:  1. Primary localized osteoarthritis of both knees     Large Joint Inj: bilateral knee on 01/30/2020 3:48 PM Indications: pain Details: 22 G needle, anterolateral approach Medications (Right): 0.66 mL bupivacaine 0.25 %; 3 mL lidocaine 1 %; 88 mg Hyaluronan 88 MG/4ML Medications (Left): 0.66 mL bupivacaine 0.25 %; 3 mL lidocaine 1 %; 88 mg Hyaluronan 88 MG/4ML

## 2020-03-15 ENCOUNTER — Emergency Department (HOSPITAL_COMMUNITY): Payer: Medicare HMO

## 2020-03-15 ENCOUNTER — Other Ambulatory Visit: Payer: Self-pay

## 2020-03-15 ENCOUNTER — Emergency Department (HOSPITAL_COMMUNITY)
Admission: EM | Admit: 2020-03-15 | Discharge: 2020-03-15 | Disposition: A | Discharge status: Home / Self Care | Payer: Medicare HMO | Attending: Emergency Medicine | Admitting: Emergency Medicine

## 2020-03-15 ENCOUNTER — Encounter (HOSPITAL_COMMUNITY): Payer: Self-pay

## 2020-03-15 DIAGNOSIS — K219 Gastro-esophageal reflux disease without esophagitis: Secondary | ICD-10-CM | POA: Diagnosis not present

## 2020-03-15 DIAGNOSIS — R000 Tachycardia, unspecified: Secondary | ICD-10-CM | POA: Diagnosis not present

## 2020-03-15 DIAGNOSIS — I10 Essential (primary) hypertension: Secondary | ICD-10-CM | POA: Insufficient documentation

## 2020-03-15 DIAGNOSIS — Z79899 Other long term (current) drug therapy: Secondary | ICD-10-CM | POA: Insufficient documentation

## 2020-03-15 DIAGNOSIS — D259 Leiomyoma of uterus, unspecified: Secondary | ICD-10-CM | POA: Diagnosis not present

## 2020-03-15 DIAGNOSIS — D219 Benign neoplasm of connective and other soft tissue, unspecified: Secondary | ICD-10-CM

## 2020-03-15 DIAGNOSIS — R1084 Generalized abdominal pain: Secondary | ICD-10-CM | POA: Insufficient documentation

## 2020-03-15 DIAGNOSIS — R197 Diarrhea, unspecified: Secondary | ICD-10-CM | POA: Diagnosis not present

## 2020-03-15 DIAGNOSIS — R112 Nausea with vomiting, unspecified: Secondary | ICD-10-CM | POA: Insufficient documentation

## 2020-03-15 LAB — CBC
HCT: 43.5 % (ref 36.0–46.0)
Hemoglobin: 13.6 g/dL (ref 12.0–15.0)
MCH: 25.2 pg — ABNORMAL LOW (ref 26.0–34.0)
MCHC: 31.3 g/dL (ref 30.0–36.0)
MCV: 80.6 fL (ref 80.0–100.0)
Platelets: 327 10*3/uL (ref 150–400)
RBC: 5.4 MIL/uL — ABNORMAL HIGH (ref 3.87–5.11)
RDW: 16.1 % — ABNORMAL HIGH (ref 11.5–15.5)
WBC: 12.4 10*3/uL — ABNORMAL HIGH (ref 4.0–10.5)
nRBC: 0 % (ref 0.0–0.2)

## 2020-03-15 LAB — URINALYSIS, ROUTINE W REFLEX MICROSCOPIC
Bilirubin Urine: NEGATIVE
Glucose, UA: NEGATIVE mg/dL
Hgb urine dipstick: NEGATIVE
Ketones, ur: NEGATIVE mg/dL
Leukocytes,Ua: NEGATIVE
Nitrite: NEGATIVE
Protein, ur: NEGATIVE mg/dL
Specific Gravity, Urine: 1.021 (ref 1.005–1.030)
pH: 5 (ref 5.0–8.0)

## 2020-03-15 LAB — COMPREHENSIVE METABOLIC PANEL
ALT: 14 U/L (ref 0–44)
AST: 16 U/L (ref 15–41)
Albumin: 4.2 g/dL (ref 3.5–5.0)
Alkaline Phosphatase: 93 U/L (ref 38–126)
Anion gap: 8 (ref 5–15)
BUN: 20 mg/dL (ref 8–23)
CO2: 24 mmol/L (ref 22–32)
Calcium: 8.7 mg/dL — ABNORMAL LOW (ref 8.9–10.3)
Chloride: 108 mmol/L (ref 98–111)
Creatinine, Ser: 0.92 mg/dL (ref 0.44–1.00)
GFR calc Af Amer: >60 mL/min (ref >60)
GFR calc non Af Amer: >60 mL/min (ref >60)
Glucose, Bld: 124 mg/dL — ABNORMAL HIGH (ref 70–99)
Potassium: 3.7 mmol/L (ref 3.5–5.1)
Sodium: 140 mmol/L (ref 135–145)
Total Bilirubin: 0.6 mg/dL (ref 0.3–1.2)
Total Protein: 7.6 g/dL (ref 6.5–8.1)

## 2020-03-15 LAB — TSH: TSH: 1.463 u[IU]/mL (ref 0.350–4.500)

## 2020-03-15 LAB — LIPASE, BLOOD: Lipase: 17 U/L (ref 11–51)

## 2020-03-15 MED ORDER — IOHEXOL 300 MG/ML  SOLN
100.0000 mL | Freq: Once | INTRAMUSCULAR | Status: AC | PRN
  Administered 2020-03-15: 100 mL via INTRAVENOUS

## 2020-03-15 MED ORDER — SODIUM CHLORIDE 0.9% FLUSH
3.0000 mL | Freq: Once | INTRAVENOUS | Status: AC
  Administered 2020-03-15: 3 mL via INTRAVENOUS

## 2020-03-15 MED ORDER — SODIUM CHLORIDE 0.9 % IV BOLUS
1000.0000 mL | Freq: Once | INTRAVENOUS | Status: AC
  Administered 2020-03-15: 1000 mL via INTRAVENOUS

## 2020-03-15 NOTE — Discharge Instructions (Addendum)
Your work-up today was overall reassuring.  Your CT scan did not show any evidence of life-threatening abdominal problems.  Please make sure to follow-up with your OB/GYN about the fibroids seen on your CT scan.  Your heart rate was also consistently a little bit high today, please make sure to follow-up with your family doctor about this.  We have ordered a thyroid level which will be available for review via MyChart in the next day or so as well.  Please schedule an appointment with your family doctor next week.  Overall your work-up is reassuring.  Please continue to drink lots of fluids,  and slowly start reintroducing bland foods.  I provided a handout for this.  Return to the ER if your symptoms worsen.

## 2020-03-15 NOTE — ED Provider Notes (Signed)
[facility] DEPT Provider Note   CSN: [id number] Arrival date & time: 03/15/20  1327     History Chief Complaint  Patient presents with  . Abdominal Pain  . Emesis  . Diarrhea    Acelynn Nahjae Hoeg is a 74 y.o. female.  HPI 74 year old female with history of hypertension, GERD presents to the ER for nausea, vomiting, diarrhea and abdominal pain which began this morning.  Patient states that she woke up feeling nauseous and then started to have episodes of vomiting and watery diarrhea.  Present nonbloody, nonbilious.  Diarrhea is watery without evidence of blood.  She has never had this before.  She has not tried taking anything for her symptoms.  Has not been able to tolerate any food or water today.  Recalls eating fish last night, but no other sick family members in the home.  No recent sick contacts.  She denies any chest pain, shortness of breath, fever, back pain, chills, pelvic pain, abnormal discharge, dysuria, hematuria, hematochezia.    Past Medical History:  Diagnosis Date  . Arthritis   . GERD (gastroesophageal reflux disease)   . High cholesterol   . Hypertension   . MMT (medial meniscus tear)    right    Patient Active Problem List   Diagnosis Date Noted  . Primary osteoarthritis of right knee 12/11/2019  . Primary osteoarthritis of left knee 12/11/2019  . Bilateral primary osteoarthritis of knee 02/28/2019  . Trochanteric bursitis of left hip 10/06/2018  . S/P right knee arthroscopy 08/05/2018  . Morbid obesity ([place]) 08/05/2018  . Body mass index 40.0-44.9, adult ([place]) 08/05/2018  . Chondromalacia, right knee   . Other meniscus derangements, posterior horn of medial meniscus, right knee   . Positive ANA (antinuclear antibody) 06/14/2018  . Chronic pain of right knee 06/14/2018  . Acute medial meniscus tear, right, initial encounter 06/14/2018    Past Surgical History:  Procedure Laterality Date  . CHOLECYSTECTOMY    .  KNEE ARTHROSCOPY WITH MEDIAL MENISECTOMY Right 06/22/2018   Procedure: RIGHT KNEE ARTHROSCOPY WITH MEDIAL MENISCECTOMY, CHONDROPLASTY;  Surgeon: Leandrew Koyanagi, MD;  Location: [facility];  Service: Orthopedics;  Laterality: Right;  . SHOULDER ARTHROSCOPY     removal of bone spur  . TONSILLECTOMY    . TUBAL LIGATION       OB History   No obstetric history on file.     Family History  Problem Relation Age of Onset  . Diabetes Mother   . Heart failure Mother   . Heart failure Father     Social History   Tobacco Use  . Smoking status: Never Smoker  . Smokeless tobacco: Never Used  Vaping Use  . Vaping Use: Never used  Substance Use Topics  . Alcohol use: Yes    Comment: social  . Drug use: Never    Home Medications Prior to Admission medications   Medication Sig Start Date End Date Taking? Authorizing Provider  celecoxib (CELEBREX) 200 MG capsule TAKE 1 CAPSULE(200 MG) BY MOUTH TWICE DAILY 10/31/18   Leandrew Koyanagi, MD  cyclobenzaprine (FLEXERIL) 5 MG tablet Take 1-2 tablets (5-10 mg total) by mouth 3 (three) times daily as needed for muscle spasms. 03/14/19   Leandrew Koyanagi, MD  diclofenac sodium (VOLTAREN) 1 % GEL Apply 4 g topically 4 (four) times daily. 03/12/19   Petrucelli, Samantha R, PA-C  esomeprazole (NEXIUM) 20 MG capsule Take 20 mg by mouth 2 (two) times daily before  a meal.    [provider]  HYDROcodone-acetaminophen (NORCO) 5-325 MG tablet Take 1 tablet by mouth 3 (three) times daily as needed for moderate pain. 08/05/18   Aundra Dubin, PA-C  lisinopril (PRINIVIL,ZESTRIL) 20 MG tablet Take 20 mg by mouth 2 (two) times daily. 02/14/18   [provider]  meclizine (ANTIVERT) 25 MG tablet Take 25 mg by mouth every 8 (eight) hours as needed for dizziness.    [provider]  meloxicam (MOBIC) 7.5 MG tablet TAKE 1 TABLET BY MOUTH DAILY AS NEEDED FOR PAIN 09/29/18   Aundra Dubin, PA-C  ondansetron (ZOFRAN) 4 MG tablet Take 1-2  tablets (4-8 mg total) by mouth every 8 (eight) hours as needed for nausea or vomiting. 06/22/18   Leandrew Koyanagi, MD  oxybutynin (DITROPAN) 5 MG tablet Take 5 mg by mouth every evening. 02/14/18   [provider]  predniSONE (STERAPRED UNI-PAK 21 TAB) 10 MG (21) TBPK tablet Take as directed 03/14/19   Leandrew Koyanagi, MD  promethazine (PHENERGAN) 25 MG tablet Take 1 tablet (25 mg total) by mouth every 6 (six) hours as needed for nausea. 06/22/18   Leandrew Koyanagi, MD  senna-docusate (SENOKOT S) 8.6-50 MG tablet Take 1 tablet by mouth at bedtime as needed. 06/22/18   Leandrew Koyanagi, MD    Allergies    Patient has no known allergies.  Review of Systems   Review of Systems  Constitutional: Negative for chills and fever.  HENT: Negative for ear pain and sore throat.   Eyes: Negative for pain and visual disturbance.  Respiratory: Negative for cough and shortness of breath.   Cardiovascular: Negative for chest pain and palpitations.  Gastrointestinal: Positive for abdominal pain, diarrhea, nausea and vomiting.  Genitourinary: Negative for dysuria, hematuria, pelvic pain and vaginal bleeding.  Musculoskeletal: Negative for arthralgias and back pain.  Skin: Negative for color change and rash.  Neurological: Negative for dizziness, seizures, syncope, weakness, numbness and headaches.  All other systems reviewed and are negative.   Physical Exam Updated Vital Signs BP (!) 170/90   Pulse (!) 119   Temp 99.7 F (37.6 C) (Oral)   Resp 17   Ht 5\' 1"  (1.549 m)   Wt 97.5 kg   SpO2 98%   BMI 40.62 kg/m   Physical Exam Vitals reviewed.  Constitutional:      General: She is not in acute distress.    Appearance: Normal appearance. She is not ill-appearing, toxic-appearing or diaphoretic.     Comments: Standing up walking around her room and brushing her teeth as I entered  HENT:     Head: Normocephalic and atraumatic.  Eyes:     General:        Right eye: No discharge.        Left eye: No  discharge.     Extraocular Movements: Extraocular movements intact.     Conjunctiva/sclera: Conjunctivae normal.  Cardiovascular:     Rate and Rhythm: Normal rate and regular rhythm.     Heart sounds: Normal heart sounds.  Pulmonary:     Effort: Pulmonary effort is normal.     Breath sounds: Normal breath sounds.  Abdominal:     General: Abdomen is flat. Bowel sounds are normal.     Palpations: Abdomen is soft.     Tenderness: There is generalized abdominal tenderness. There is no right CVA tenderness, left CVA tenderness or guarding. Negative signs include McBurney's sign.     Hernia: No  hernia is present.  Musculoskeletal:        General: No swelling. Normal range of motion.  Skin:    General: Skin is warm and dry.  Neurological:     General: No focal deficit present.     Mental Status: She is alert and oriented to person, place, and time.     Cranial Nerves: No cranial nerve deficit.  Psychiatric:        Mood and Affect: Mood normal.        Behavior: Behavior normal.     ED Results / Procedures / Treatments   Labs (all labs ordered are listed, but only abnormal results are displayed) Labs Reviewed  COMPREHENSIVE METABOLIC PANEL - Abnormal; Notable for the following components:      Result Value   Glucose, Bld 124 (*)    Calcium 8.7 (*)    All other components within normal limits  CBC - Abnormal; Notable for the following components:   WBC 12.4 (*)    RBC 5.40 (*)    MCH 25.2 (*)    RDW 16.1 (*)    All other components within normal limits  LIPASE, BLOOD  URINALYSIS, ROUTINE W REFLEX MICROSCOPIC  TSH    EKG EKG Interpretation  Date/Time:  Friday March 15 2020 21:19:19 EDT Ventricular Rate:  113 PR Interval:    QRS Duration: 90 QT Interval:  326 QTC Calculation: 447 R Axis:   60 Text Interpretation: Sinus tachycardia Confirmed by Lajean Saver [phone]) on 03/15/2020 9:38:59 PM   Radiology CT ABDOMEN PELVIS W CONTRAST  Result Date: 03/15/2020 CLINICAL  DATA:  Abdominal pain for several hours EXAM: CT ABDOMEN AND PELVIS WITH CONTRAST TECHNIQUE: Multidetector CT imaging of the abdomen and pelvis was performed using the standard protocol following bolus administration of intravenous contrast. CONTRAST:  147mL OMNIPAQUE IOHEXOL 300 MG/ML  SOLN COMPARISON:  None. FINDINGS: Lower chest: Lung bases are well aerated without focal infiltrate or sizable effusion. There is a pleural based nodular density along the major fissure best seen on image number 7 of series 6 or likely representing a subpleural lymph node. Hepatobiliary: Fatty infiltration of the liver is noted. The gallbladder has been surgically removed. Pancreas: Unremarkable. No pancreatic ductal dilatation or surrounding inflammatory changes. Spleen: Normal in size without focal abnormality. Adrenals/Urinary Tract: Right adrenal gland is within normal limits. Left adrenal gland demonstrates a 2.5 cm mass lesion with central decreased attenuation this may represent an adenoma. Kidneys demonstrate a normal enhancement pattern bilaterally. No definitive renal calculi or obstructive changes are seen. The bladder is decompressed. Stomach/Bowel: The appendix is within normal limits. No obstructive or inflammatory changes of the colon are seen. The small bowel appears within normal limits. No gastric abnormality is noted. Vascular/Lymphatic: Aortic atherosclerosis. No enlarged abdominal or pelvic lymph nodes. Reproductive: Uterus is well visualized and demonstrates multiple uterine fibroids. The largest of these measures approximately 6 x 5 cm in greatest dimension. Central decreased attenuation is noted which may represent some underlying necrosis. No definitive ovarian mass is seen. Other: No abdominal wall hernia or abnormality. No abdominopelvic ascites. Musculoskeletal: No acute or significant osseous findings. IMPRESSION: Uterine fibroids. Some decreased attenuation is noted within the largest fibroid which may  represent some underlying necrosis. Left adrenal lesion likely representing an adenoma. Nonemergent adrenal CT is recommended for further evaluation. No other focal abnormality is noted. Electronically Signed   By: Inez Catalina M.D.   On: 03/15/2020 18:21    Procedures Procedures (including critical care time)  Medications Ordered in ED Medications  sodium chloride flush (NS) 0.9 % injection 3 mL (3 mLs Intravenous Given 03/15/20 1747)  sodium chloride 0.9 % bolus 1,000 mL (0 mLs Intravenous Stopped 03/15/20 1857)  iohexol (OMNIPAQUE) 300 MG/ML solution 100 mL (100 mLs Intravenous Contrast Given 03/15/20 1800)  sodium chloride 0.9 % bolus 1,000 mL (0 mLs Intravenous Stopped 03/15/20 2158)    ED Course  I have reviewed the triage vital signs and the nursing notes.  Pertinent labs & imaging results that were available during my care of the patient were reviewed by me and considered in my medical decision making (see chart for details).    MDM Rules/Calculators/A&P                         74 year old female with nausea, vomiting, abdominal pain and diarrhea which began this morning. On presentation to the ER, the patient is alert and oriented, nontoxic-appearing, in no acute distress, walking around the room and brushing her teeth as I entered.  Physical exam with generalized abdominal tenderness, with no focal tenderness.  No other acute abnormalities.  Negative special tests.  She was not actively nauseous she was hypertensive on presentation with a blood pressure 135/65 and tachycardic at 119.  She was not tachypneic.  CBC with mild leukocytosis of 12.4, normal hemoglobin.  Lipase normal.  CMP without significant electrolyte abnormalities, normal AST/ALT, BUN/creatinine.  UA without evidence of UTI.  CT of the abdomen showed no acute intra-abdominal abnormalities, though there were some fibroids with questionable necrosis noted.  Patient received 2 L of fluids, on serial abdominal reexamination  notes improvement in her abdominal pain.  She tolerated p.o. challenge well.  Of note, patient has remained consistently tachycardic throughout the ED course.  EKG shows sinus tach.  Unknown source of tachycardia at this time.  I discussed this with Dr. Ashok Cordia, will add on TSH and have her follow-up with her PCP.  Patient states that she does have a OB/GYN but has not seen them in a while.  I encouraged her to follow-up with them within the next week or 2 in order to further evaluate the fibroids found on CT scan.  She voices understanding is agreeable to this plan.  Return precautions given.  At this stage in the ED course, the patient has been medically screened and is stable for discharge.  Final Clinical Impression(s) / ED Diagnoses Final diagnoses:  Nausea vomiting and diarrhea  Fibroids  Tachycardia    Rx / DC Orders ED Discharge Orders    None       Lyndel Safe 03/15/20 2236    Lajean Saver, MD 03/17/20 1851

## 2020-03-15 NOTE — ED Triage Notes (Signed)
Patient c/o generalized abdominal pain, N/V/D since this AM.

## 2020-03-18 ENCOUNTER — Encounter: Payer: Self-pay | Admitting: Physician Assistant

## 2020-04-19 ENCOUNTER — Ambulatory Visit: Payer: Medicare HMO | Admitting: Physician Assistant

## 2020-09-02 ENCOUNTER — Telehealth: Payer: Self-pay

## 2020-09-02 NOTE — Telephone Encounter (Signed)
Patient called she is requesting pre approval for gel injection in both knees. [phone]

## 2020-09-02 NOTE — Telephone Encounter (Signed)
Please advise. Would you like for [place] to submit for authorization?

## 2020-09-02 NOTE — Telephone Encounter (Signed)
Please obtain authorization for gel injections-Dr. Erlinda Hong.

## 2020-09-02 NOTE — Telephone Encounter (Signed)
yes

## 2020-09-04 NOTE — Telephone Encounter (Signed)
Noted  

## 2020-09-05 ENCOUNTER — Other Ambulatory Visit: Payer: Self-pay

## 2020-09-05 ENCOUNTER — Ambulatory Visit: Payer: Medicare HMO | Admitting: Podiatry

## 2020-09-05 ENCOUNTER — Encounter: Payer: Self-pay | Admitting: Podiatry

## 2020-09-05 ENCOUNTER — Ambulatory Visit (INDEPENDENT_AMBULATORY_CARE_PROVIDER_SITE_OTHER): Payer: Medicare HMO

## 2020-09-05 DIAGNOSIS — M21619 Bunion of unspecified foot: Secondary | ICD-10-CM | POA: Diagnosis not present

## 2020-09-05 DIAGNOSIS — S9031XA Contusion of right foot, initial encounter: Secondary | ICD-10-CM

## 2020-09-06 NOTE — Progress Notes (Signed)
Subjective:   Patient ID: Shawna Thompson, female   DOB: 74 y.o.   MRN: [id number]   HPI Patient presents stating she has had discoloration and a lesion on the bottom of her right foot recently that she is concerned about and also is concerned about prominence around her first metatarsal head with discomfort with shoe gear that she has noticed more recently.  Patient states this has gradually become more of an issue over the last few months and the lesion on the bottom more recent.  Patient does not smoke likes to be active   Review of Systems  All other systems reviewed and are negative.       Objective:  Physical Exam Vitals and nursing note reviewed.  Constitutional:      Appearance: She is well-developed.  Pulmonary:     Effort: Pulmonary effort is normal.  Musculoskeletal:        General: Normal range of motion.  Skin:    General: Skin is warm.  Neurological:     Mental Status: She is alert.     Neurovascular status was found to be intact muscle strength is adequate range of motion adequate subtalar midtarsal joint.  Patient is noted to have a small area on the plantar lateral aspect of the right foot localized mildly tender but no erythema no edema or no drainage associated with it and does have structural bunion deformity right over left foot with redness around the joint surface.  Good digital perfusion well oriented x3    Assessment:  Mobility that this is a small contusion bottom right but does not appear to be through the subcutaneous tissue no indication of infection along with structural deformity     Plan:  H&P education about both conditions given and recommended soaks and padding therapy.  Discussed wider shoe gear and topicals for the bunion and if it should start to become more symptomatic I educated her on structural bunion correction.  Patient will be seen back as needed

## 2020-09-16 ENCOUNTER — Telehealth: Payer: Self-pay | Admitting: Orthopaedic Surgery

## 2020-09-16 NOTE — Telephone Encounter (Signed)
Pt called stating she has been trying to get authorized for a gel injection since 09/02/20 and she hasn't heard anything back; pt would like a CB to update her even if it's just to tell her how much longer she may have to wait please.   [phone]

## 2020-09-17 ENCOUNTER — Telehealth: Payer: Self-pay

## 2020-09-17 NOTE — Telephone Encounter (Signed)
Submitted VOB, Monovisc, bilateral knee. 

## 2020-09-17 NOTE — Telephone Encounter (Signed)
Called and left a VM advising patient to call back concerning gel injection.

## 2020-09-18 ENCOUNTER — Telehealth: Payer: Self-pay

## 2020-09-18 NOTE — Telephone Encounter (Signed)
PA required for Monovisc, bilateral knee. PA submitted online through [facility] Pending# [id number]

## 2020-09-20 ENCOUNTER — Telehealth: Payer: Self-pay

## 2020-09-20 NOTE — Telephone Encounter (Signed)
Approved, Monovisc, Bilateral Knee. [place] Patient will be responsible for 20% OOP. Co-pay of $25.00 PA required PA Approval# [id number] Valid 09/18/2020- 10/04/2020  Appt. 09/25/2020 with Dr. Erlinda Hong

## 2020-09-25 ENCOUNTER — Ambulatory Visit: Payer: Medicare HMO | Admitting: Orthopaedic Surgery

## 2020-09-26 IMAGING — CT CT OF THE LEFT KNEE WITHOUT CONTRAST
3 series · 11 of 33 positions shown, 13 images · non-contrast
Comparison: Left knee x-rays from same day.

CLINICAL DATA: Acute posterior knee pain after feeling a pop while
walking today.

EXAM:
CT OF THE LEFT KNEE WITHOUT CONTRAST
TECHNIQUE: Multidetector CT imaging of the left knee was performed according to
the standard protocol. Multiplanar CT image reconstructions were
also generated.

[Series 3: axial bone · axial · 0.37mm/px · z∈[-240,-64]mm · 3 of 145 slices shown, 4 images]
[im 34/145  soft-tissue]
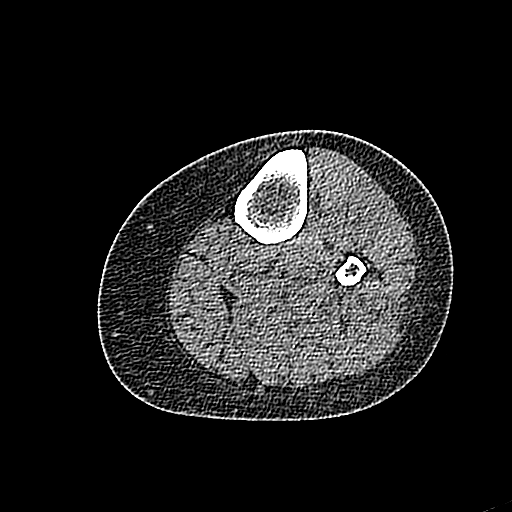
[im 34/145  bone]
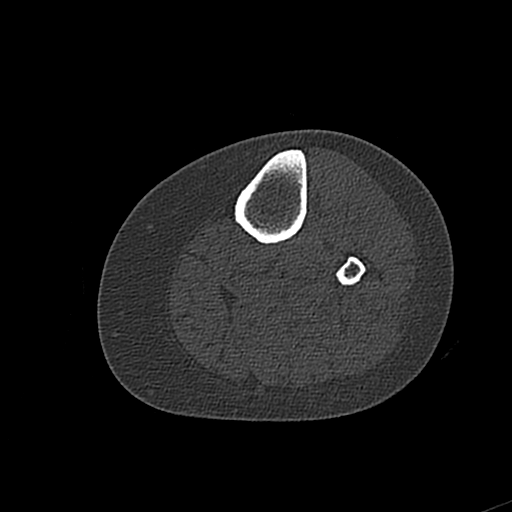
[im 78/145  bone]
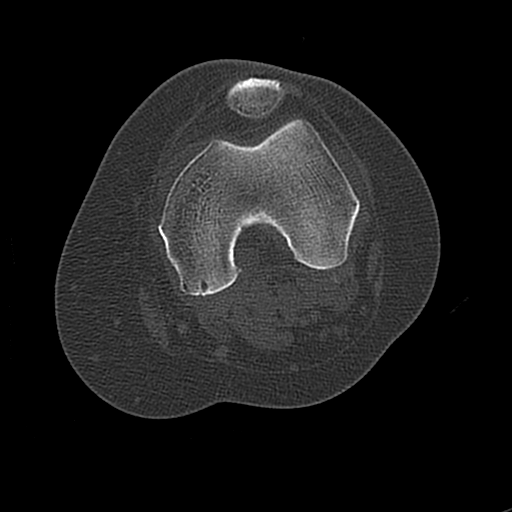
[im 122/145  bone]
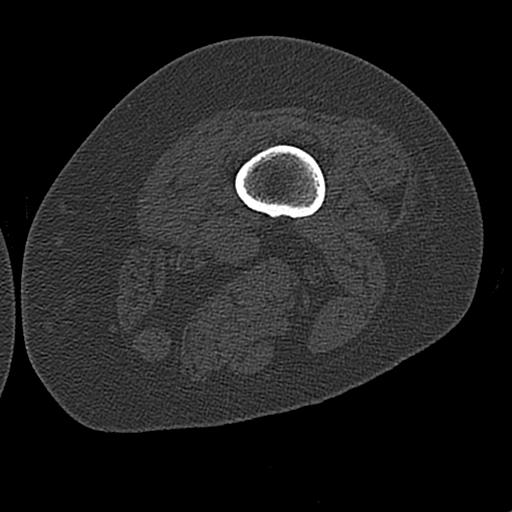

[Series 8: coronal st · coronal · 0.32mm/px · 3 of 85 slices shown]
[im 17/85  bone]
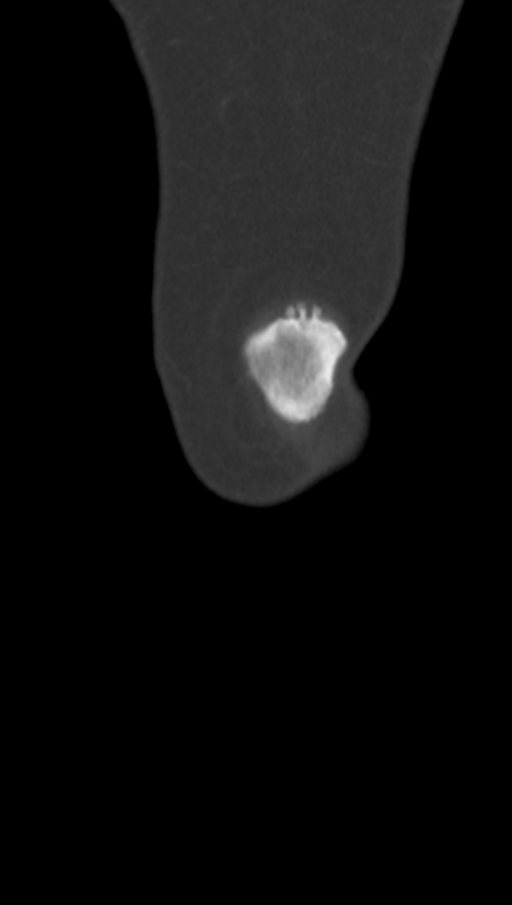
[im 34/85  bone]
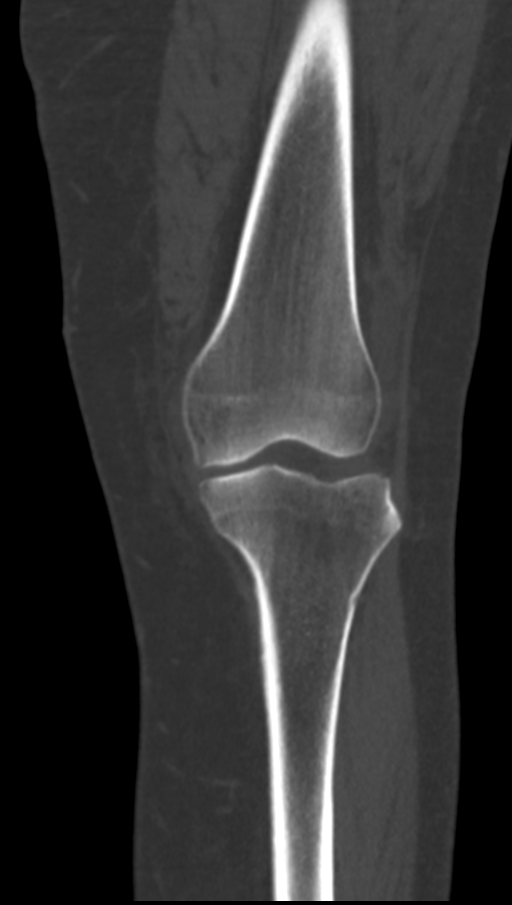
[im 51/85  bone]
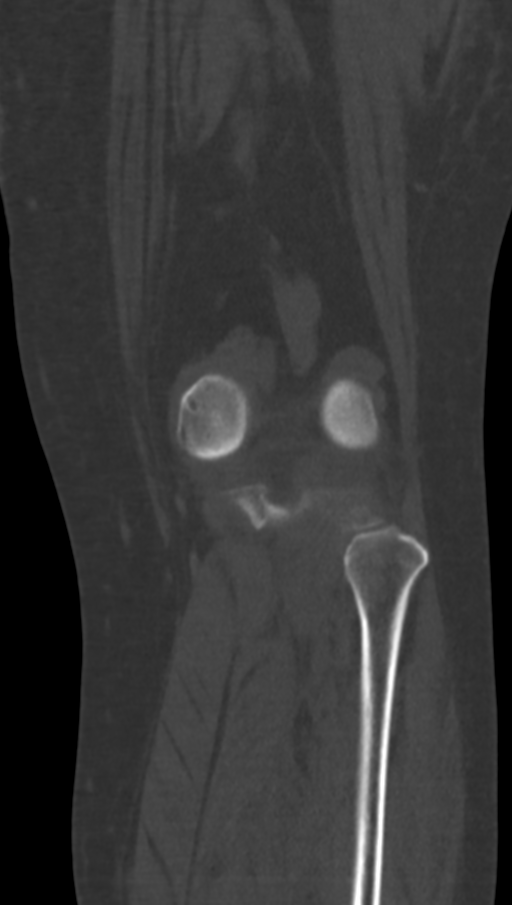

[Series 9: sagittal st · sagittal · 0.33mm/px · 5 of 55 slices shown, 6 images]
[im 19/55  bone]
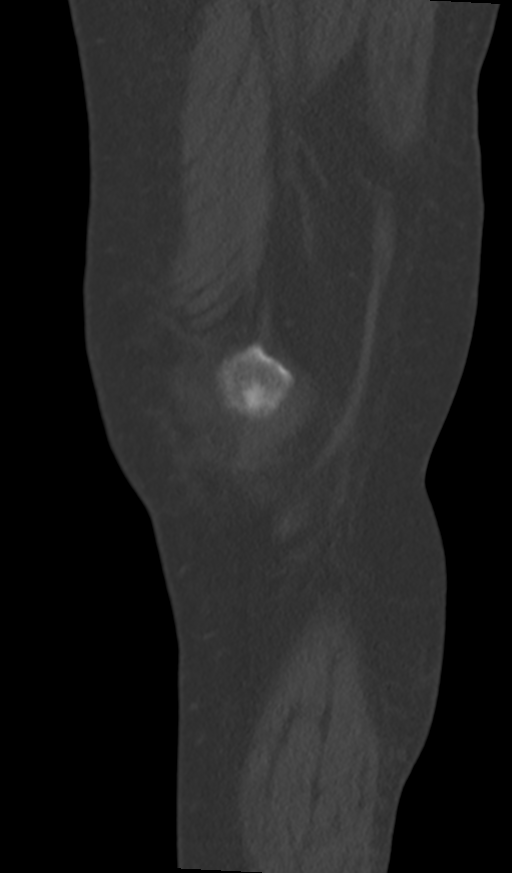
[im 23/55  bone]
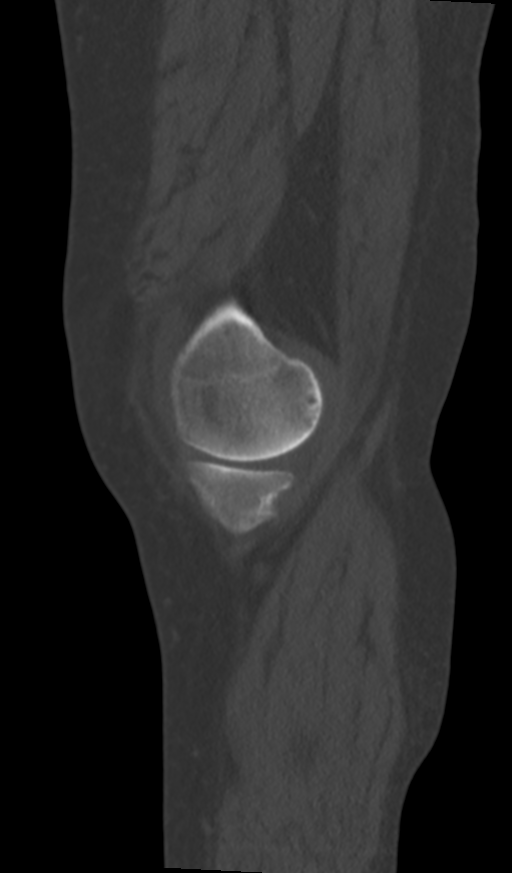
[im 28/55  soft-tissue]
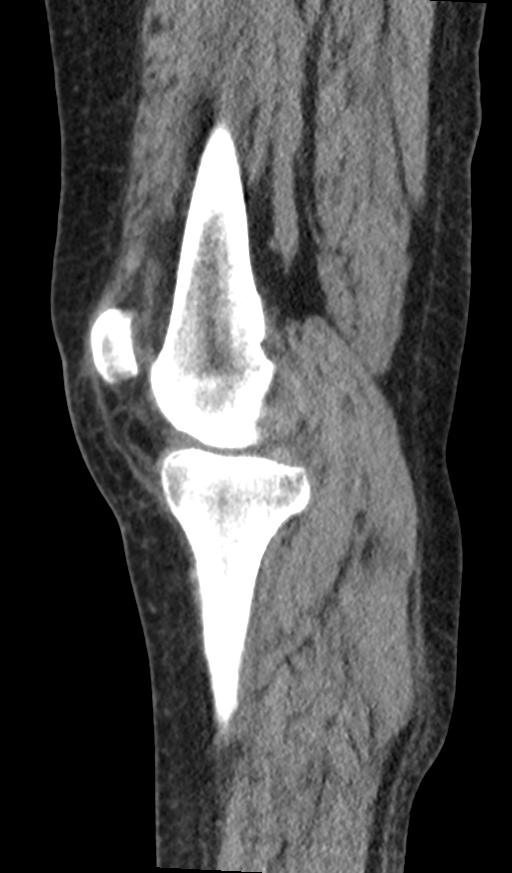
[im 28/55  bone]
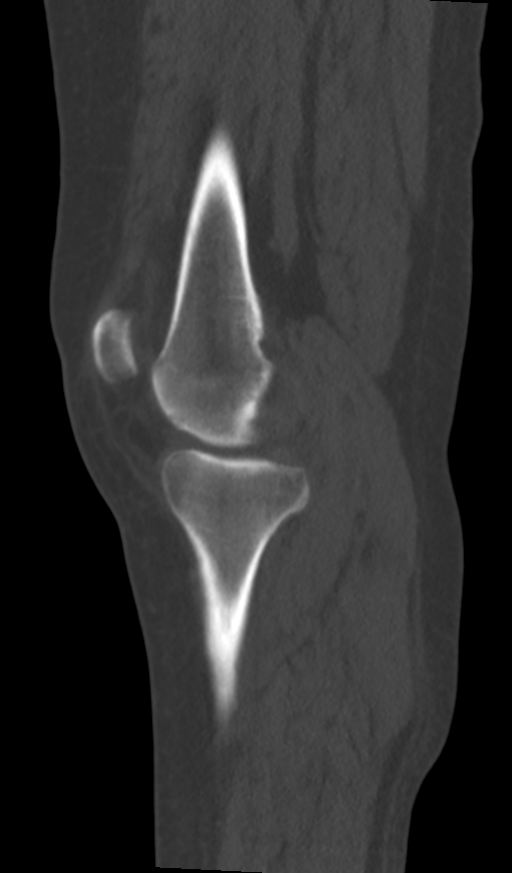
[im 32/55  bone]
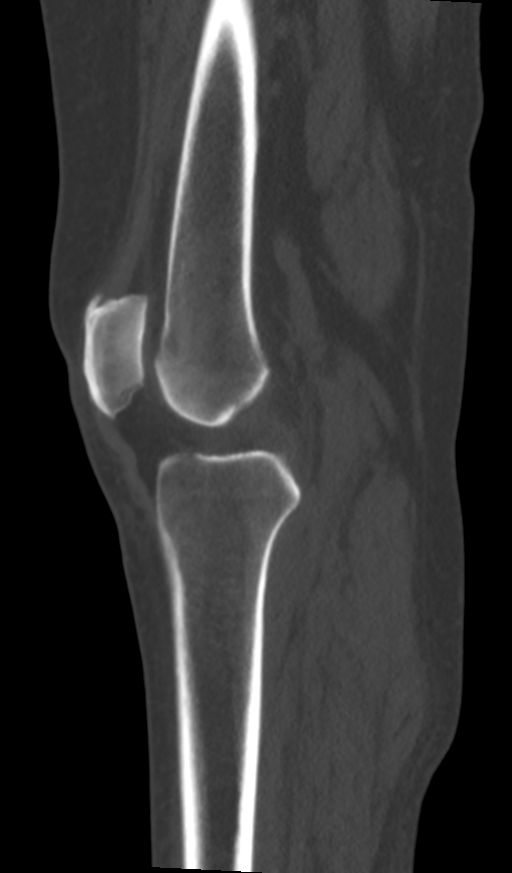
[im 37/55  bone]
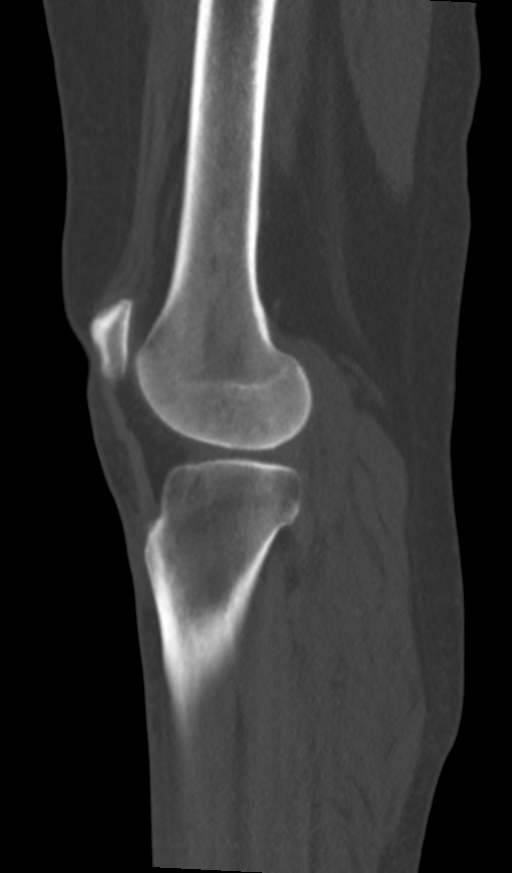

[11 of 33 positions shown; findings below may reference images not displayed]

FINDINGS: Bones/Joint/Cartilage

No fracture or dislocation. Mild medial compartment joint space
narrowing. Mild subchondral cystic change along the posterior
nonweightbearing medial femoral condyle. Trace joint effusion. No
Baker cyst.

Ligaments

Ligaments are suboptimally evaluated by CT.

Muscles and Tendons
Grossly intact.  No muscle atrophy.

Soft tissue
No fluid collection or hematoma.  No soft tissue mass.
IMPRESSION: 1. No acute osseous abnormality.
2. Mild medial compartment osteoarthritis.

## 2020-09-26 IMAGING — DX LEFT KNEE - COMPLETE 4+ VIEW
4 series · 4 of 4 positions shown · non-contrast
Comparison: None.

CLINICAL DATA: Popping pain in left knee, no trauma

EXAM:
LEFT KNEE - COMPLETE 4+ VIEW

[knee ap]
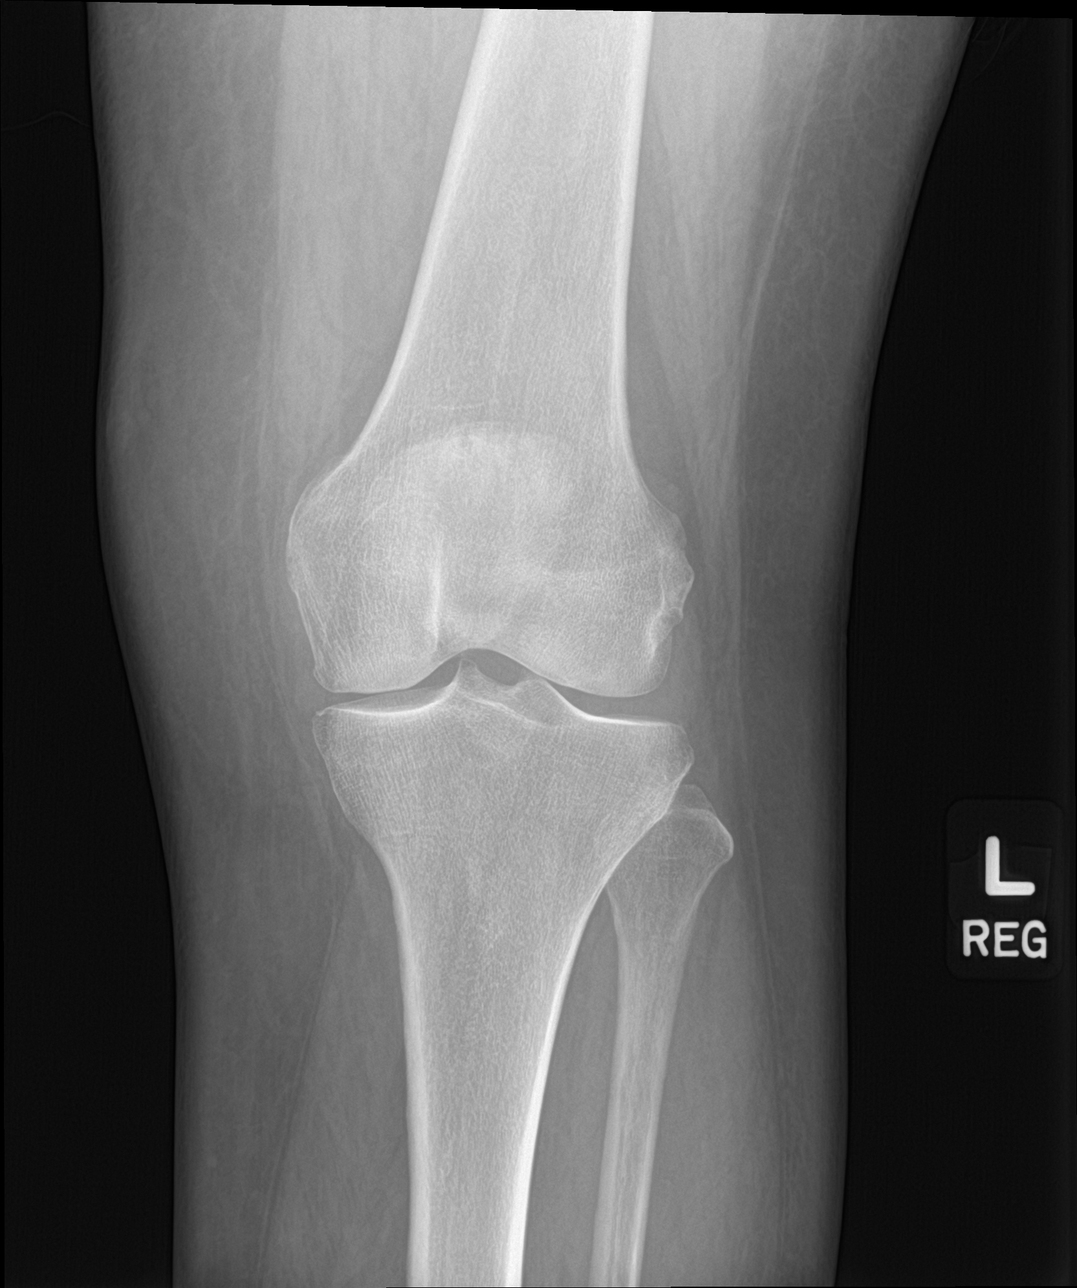

[tunnel]
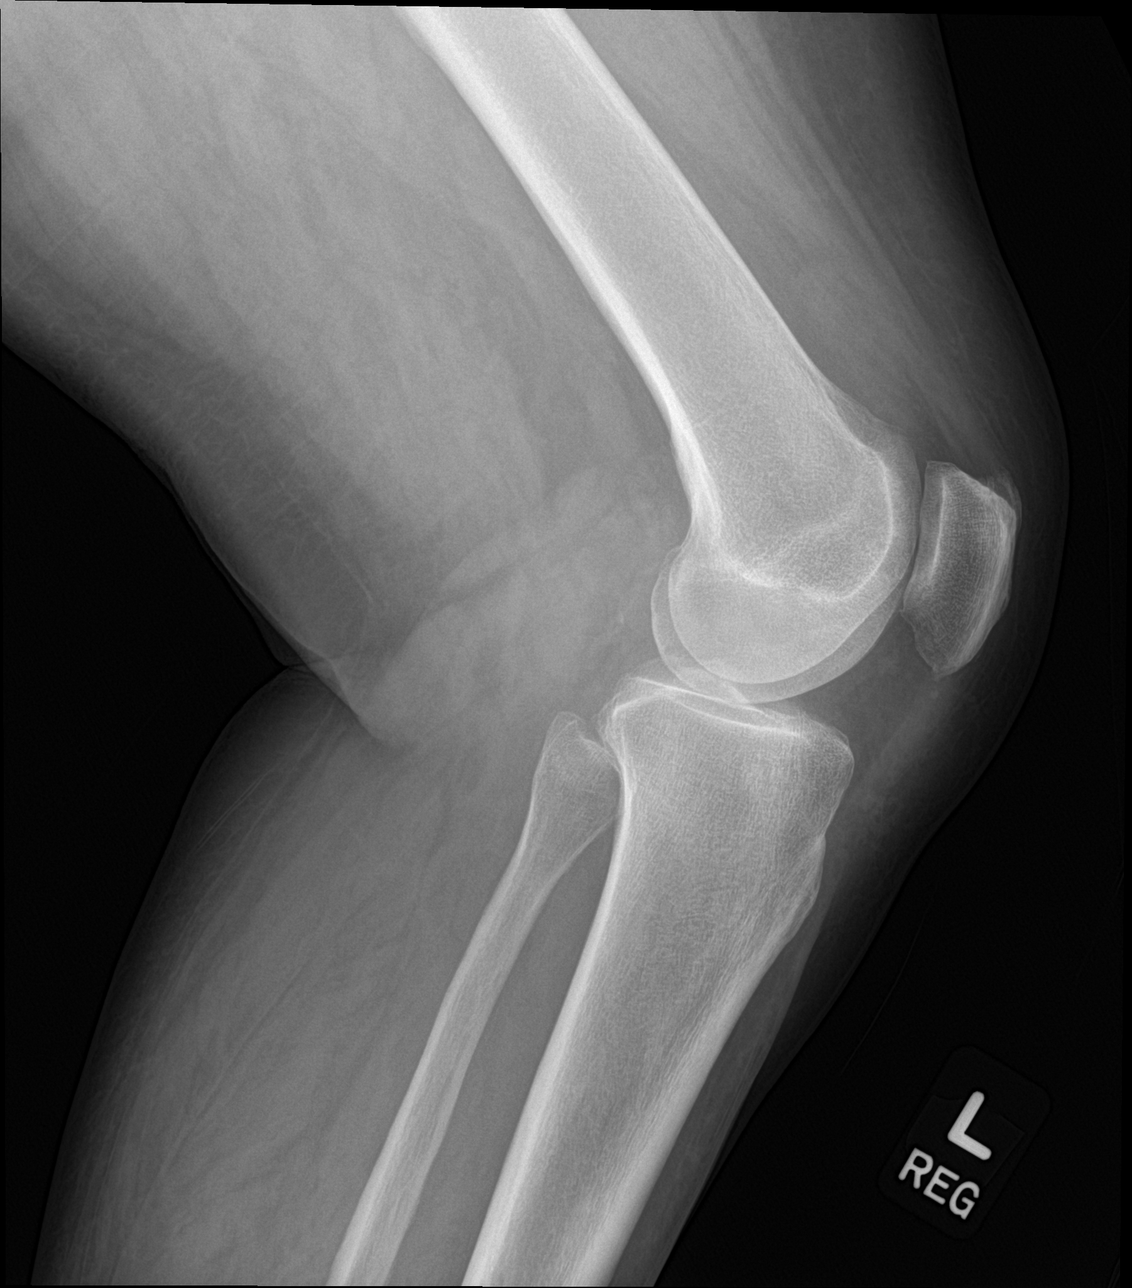

[knee obl (1 of 2)]
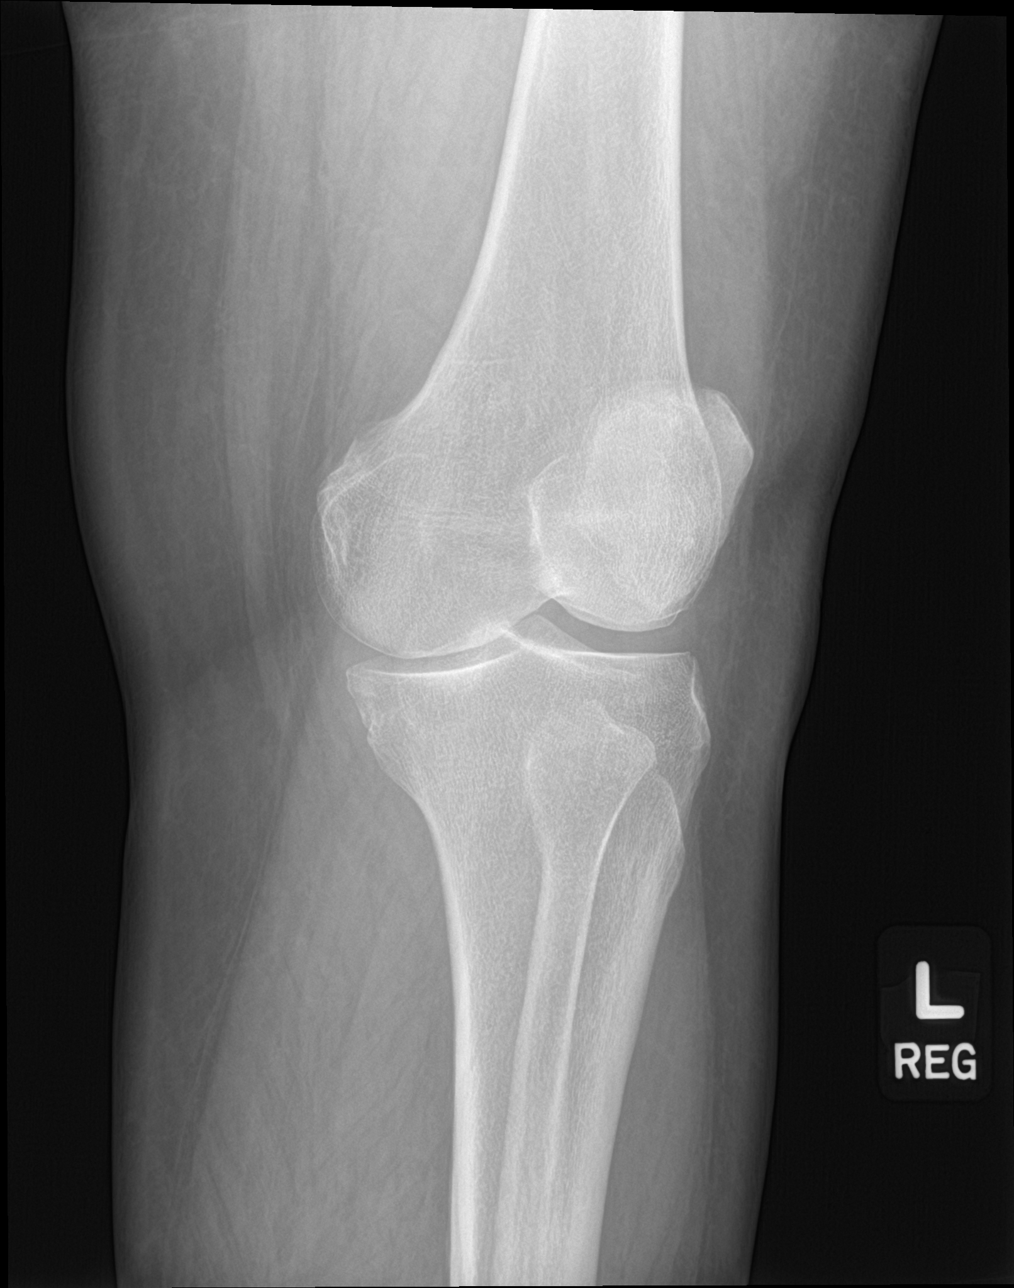

[knee obl (2 of 2)]
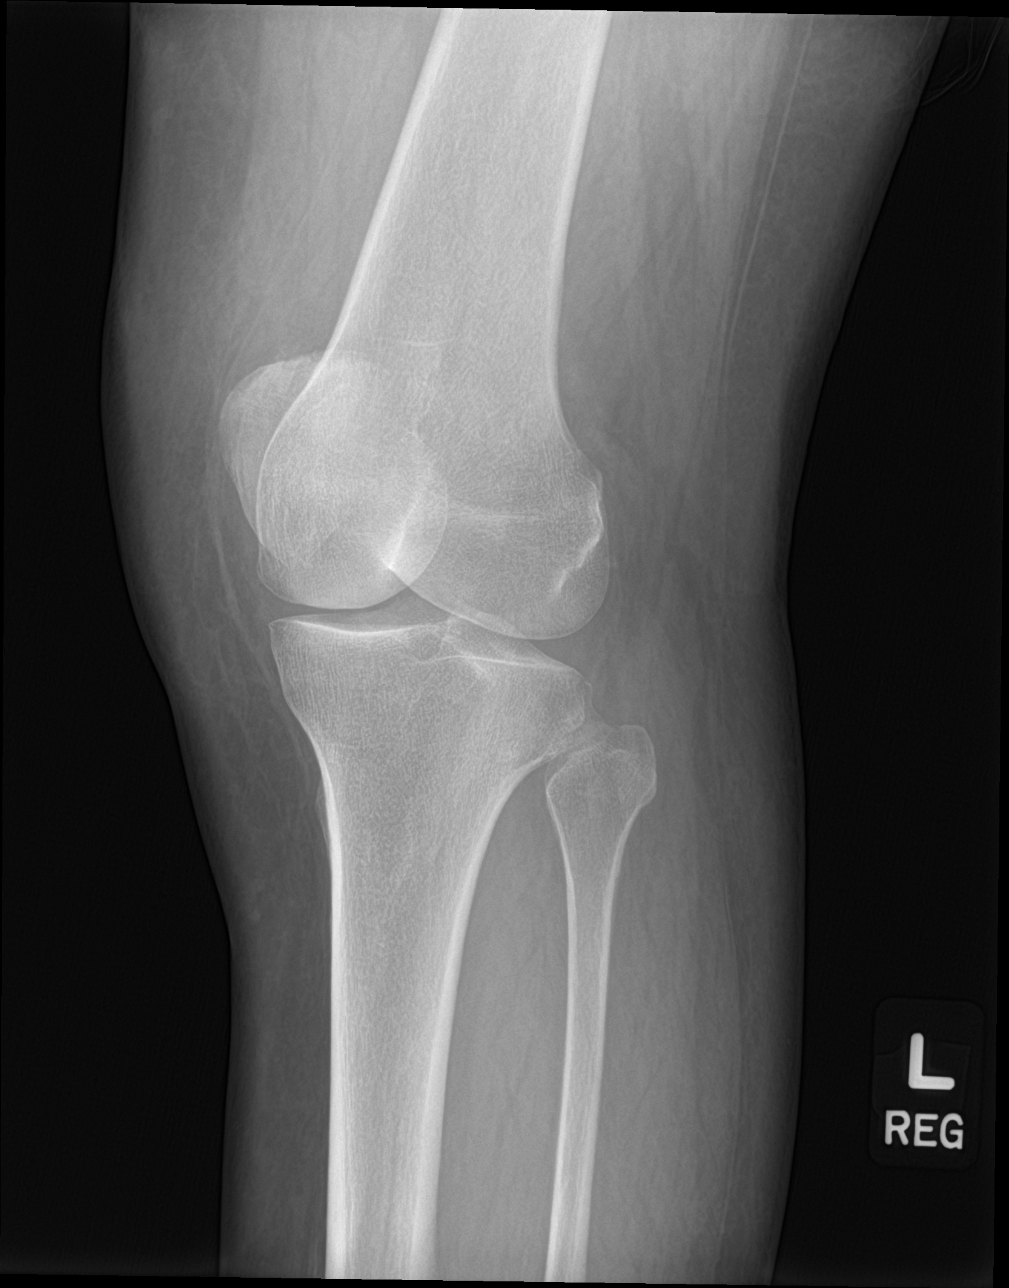

[4 of 4 positions shown; findings below may reference images not displayed]

FINDINGS: No fracture or dislocation of the left knee. There is minimal medial
and patellofemoral compartment joint space narrowing and
osteophytosis. There is a small, nonspecific knee joint effusion.
IMPRESSION: No fracture or dislocation of the left knee. There is minimal medial
and patellofemoral compartment joint space narrowing and
osteophytosis. There is a small, nonspecific knee joint effusion.

## 2020-10-02 ENCOUNTER — Ambulatory Visit: Payer: Medicare HMO | Admitting: Orthopaedic Surgery

## 2020-10-02 ENCOUNTER — Encounter: Payer: Self-pay | Admitting: Orthopaedic Surgery

## 2020-10-02 ENCOUNTER — Other Ambulatory Visit: Payer: Self-pay

## 2020-10-02 VITALS — Ht 61.0 in | Wt 215.0 lb

## 2020-10-02 DIAGNOSIS — M170 Bilateral primary osteoarthritis of knee: Secondary | ICD-10-CM | POA: Diagnosis not present

## 2020-10-02 DIAGNOSIS — M1711 Unilateral primary osteoarthritis, right knee: Secondary | ICD-10-CM | POA: Diagnosis not present

## 2020-10-02 DIAGNOSIS — M1712 Unilateral primary osteoarthritis, left knee: Secondary | ICD-10-CM

## 2020-10-02 MED ORDER — BUPIVACAINE HCL 0.5 % IJ SOLN
2.0000 mL | INTRAMUSCULAR | Status: AC | PRN
  Administered 2020-10-02: 2 mL via INTRA_ARTICULAR

## 2020-10-02 MED ORDER — LIDOCAINE HCL 1 % IJ SOLN
2.0000 mL | INTRAMUSCULAR | Status: AC | PRN
  Administered 2020-10-02: 2 mL

## 2020-10-02 MED ORDER — HYALURONAN 88 MG/4ML IX SOSY
88.0000 mg | PREFILLED_SYRINGE | INTRA_ARTICULAR | Status: AC | PRN
  Administered 2020-10-02: 88 mg via INTRA_ARTICULAR

## 2020-10-02 NOTE — Progress Notes (Signed)
   Procedure Note  Patient: Shawna Thompson             Date of Birth: October 07, 1945           MRN: [id number]             Visit Date: 10/02/2020  Procedures: Visit Diagnoses:  1. Primary osteoarthritis of left knee   2. Primary osteoarthritis of right knee     Large Joint Inj: bilateral knee on 10/02/2020 10:38 AM Indications: pain Details: 22 G needle  Arthrogram: No  Medications (Right): 2 mL lidocaine 1 %; 2 mL bupivacaine 0.5 %; 88 mg Hyaluronan 88 MG/4ML Medications (Left): 2 mL lidocaine 1 %; 2 mL bupivacaine 0.5 %; 88 mg Hyaluronan 88 MG/4ML Outcome: tolerated well, no immediate complications Patient was prepped and draped in the usual sterile fashion.    [place] here for bilateral monovisc injections today.  Reports no changes.

## 2020-11-05 ENCOUNTER — Ambulatory Visit (INDEPENDENT_AMBULATORY_CARE_PROVIDER_SITE_OTHER): Payer: Medicare HMO | Admitting: Orthopaedic Surgery

## 2020-11-05 ENCOUNTER — Encounter: Payer: Self-pay | Admitting: Orthopaedic Surgery

## 2020-11-05 ENCOUNTER — Other Ambulatory Visit: Payer: Self-pay

## 2020-11-05 DIAGNOSIS — M65331 Trigger finger, right middle finger: Secondary | ICD-10-CM | POA: Diagnosis not present

## 2020-11-05 MED ORDER — BUPIVACAINE HCL 0.25 % IJ SOLN
0.3300 mL | INTRAMUSCULAR | Status: AC | PRN
  Administered 2020-11-05: .33 mL

## 2020-11-05 MED ORDER — METHYLPREDNISOLONE ACETATE 40 MG/ML IJ SUSP
13.3300 mg | INTRAMUSCULAR | Status: AC | PRN
  Administered 2020-11-05: 13.33 mg

## 2020-11-05 MED ORDER — LIDOCAINE HCL 1 % IJ SOLN
1.0000 mL | INTRAMUSCULAR | Status: AC | PRN
  Administered 2020-11-05: 1 mL

## 2020-11-05 NOTE — Progress Notes (Signed)
Office Visit Note   Patient: Shawna Thompson           Date of Birth: 08/04/1946           MRN: [id number] Visit Date: 11/05/2020              Requested by: Nolene Ebbs, MD [address] [place],  [place] [address] PCP: Nolene Ebbs, MD   Assessment & Plan: Visit Diagnoses:  1. Trigger finger, right middle finger     Plan: Impression is right long trigger finger.  We discussed treatment options to include cortisone injection and surgical intervention.  Patient would like to try a cortisone injection.  She will follow up with [place] as needed.  Follow-Up Instructions: Return if symptoms worsen or fail to improve.   Orders:  Orders Placed This Encounter  Procedures  . Hand/UE Inj: R long A1   No orders of the defined types were placed in this encounter.     Procedures: Hand/UE Inj: R long A1 for trigger finger on 11/05/2020 8:49 AM Indications: pain Details: 25 G needle Medications: 1 mL lidocaine 1 %; 0.33 mL bupivacaine 0.25 %; 13.33 mg methylPREDNISolone acetate 40 MG/ML      Clinical Data: No additional findings.   Subjective: Chief Complaint  Patient presents with  . Right Middle Finger - Pain    HPI patient is a pleasant 75 year old female who comes in today with pain and triggering to the right long finger.  She noticed this about a month or so ago.  Her symptoms are worse at night and first thing in the morning.  She has been wearing a finger splint which occasionally helps.  No previous trigger finger history of diabetes.  Review of Systems as detailed in HPI.  All others reviewed and are negative.   Objective: Vital Signs: There were no vitals taken for this visit.  Physical Exam well-developed well-nourished female no acute distress.  Alert and oriented x3.  Ortho Exam right hand reveals pain and a slightly palpable nodule at the A1 pulley of the long finger.  She is neurovascular intact distally.  Specialty Comments:  No specialty comments  available.  Imaging: No new imaging   PMFS History: Patient Active Problem List   Diagnosis Date Noted  . Primary osteoarthritis of right knee 12/11/2019  . Primary osteoarthritis of left knee 12/11/2019  . Bilateral primary osteoarthritis of knee 02/28/2019  . Trochanteric bursitis of left hip 10/06/2018  . S/P right knee arthroscopy 08/05/2018  . Morbid obesity ([place]) 08/05/2018  . Body mass index 40.0-44.9, adult ([place]) 08/05/2018  . Chondromalacia, right knee   . Other meniscus derangements, posterior horn of medial meniscus, right knee   . Positive ANA (antinuclear antibody) 06/14/2018  . Chronic pain of right knee 06/14/2018  . Acute medial meniscus tear, right, initial encounter 06/14/2018   Past Medical History:  Diagnosis Date  . Arthritis   . GERD (gastroesophageal reflux disease)   . High cholesterol   . Hypertension   . MMT (medial meniscus tear)    right    Family History  Problem Relation Age of Onset  . Diabetes Mother   . Heart failure Mother   . Heart failure Father     Past Surgical History:  Procedure Laterality Date  . CHOLECYSTECTOMY    . KNEE ARTHROSCOPY WITH MEDIAL MENISECTOMY Right 06/22/2018   Procedure: RIGHT KNEE ARTHROSCOPY WITH MEDIAL MENISCECTOMY, CHONDROPLASTY;  Surgeon: Leandrew Koyanagi, MD;  Location: [facility];  Service: Orthopedics;  Laterality: Right;  . SHOULDER ARTHROSCOPY     removal of bone spur  . TONSILLECTOMY    . TUBAL LIGATION     Social History   Occupational History  . Not on file  Tobacco Use  . Smoking status: Never Smoker  . Smokeless tobacco: Never Used  Vaping Use  . Vaping Use: Never used  Substance and Sexual Activity  . Alcohol use: Yes    Comment: social  . Drug use: Never  . Sexual activity: Not on file

## 2021-01-10 ENCOUNTER — Other Ambulatory Visit: Payer: Self-pay | Admitting: Internal Medicine

## 2021-01-11 LAB — LIPID PANEL
Cholesterol: 183 mg/dL (ref <200)
HDL: 44 mg/dL — ABNORMAL LOW (ref >=50)
LDL Cholesterol (Calc): 115 mg/dL (calc) — ABNORMAL HIGH
Non-HDL Cholesterol (Calc): 139 mg/dL (calc) — ABNORMAL HIGH (ref <130)
Total CHOL/HDL Ratio: 4.2 (calc) (ref <5.0)
Triglycerides: 125 mg/dL (ref <150)

## 2021-01-11 LAB — CBC
HCT: 37.7 % (ref 35.0–45.0)
Hemoglobin: 12 g/dL (ref 11.7–15.5)
MCH: 24.8 pg — ABNORMAL LOW (ref 27.0–33.0)
MCHC: 31.8 g/dL — ABNORMAL LOW (ref 32.0–36.0)
MCV: 77.9 fL — ABNORMAL LOW (ref 80.0–100.0)
MPV: 9.4 fL (ref 7.5–12.5)
Platelets: 274 10*3/uL (ref 140–400)
RBC: 4.84 10*6/uL (ref 3.80–5.10)
RDW: 15.7 % — ABNORMAL HIGH (ref 11.0–15.0)
WBC: 6.7 10*3/uL (ref 3.8–10.8)

## 2021-01-11 LAB — COMPLETE METABOLIC PANEL WITH GFR
AG Ratio: 1.8 (calc) (ref 1.0–2.5)
ALT: 14 U/L (ref 6–29)
AST: 14 U/L (ref 10–35)
Albumin: 3.9 g/dL (ref 3.6–5.1)
Alkaline phosphatase (APISO): 92 U/L (ref 37–153)
BUN/Creatinine Ratio: 16 (calc) (ref 6–22)
BUN: 17 mg/dL (ref 7–25)
CO2: 25 mmol/L (ref 20–32)
Calcium: 8.7 mg/dL (ref 8.6–10.4)
Chloride: 109 mmol/L (ref 98–110)
Creat: 1.04 mg/dL — ABNORMAL HIGH (ref 0.60–0.93)
GFR, Est African American: 62 mL/min/{1.73_m2} (ref >=60)
GFR, Est Non African American: 54 mL/min/{1.73_m2} — ABNORMAL LOW (ref >=60)
Globulin: 2.2 g/dL (calc) (ref 1.9–3.7)
Glucose, Bld: 98 mg/dL (ref 65–99)
Potassium: 4.3 mmol/L (ref 3.5–5.3)
Sodium: 142 mmol/L (ref 135–146)
Total Bilirubin: 0.3 mg/dL (ref 0.2–1.2)
Total Protein: 6.1 g/dL (ref 6.1–8.1)

## 2021-01-11 LAB — TSH: TSH: 9.85 mIU/L — ABNORMAL HIGH (ref 0.40–4.50)

## 2021-01-14 ENCOUNTER — Telehealth: Payer: Self-pay | Admitting: Orthopaedic Surgery

## 2021-01-14 NOTE — Telephone Encounter (Signed)
Patient called requesting a call back from [place]. Patient states Dr. Erlinda Hong gave her a brace for her finger awhile ago and she is asking for another one. Please call patient about this matter at [phone].

## 2021-01-15 NOTE — Telephone Encounter (Signed)
Called patient no answer. Was not able to leave VM.  Would need to know which brace she is referring to.

## 2021-01-20 NOTE — Telephone Encounter (Signed)
Pt called stating she has trigger finger and its a brace for her R middle finger. Pt would like a CB in regards to getting this.   [phone]

## 2021-01-20 NOTE — Telephone Encounter (Signed)
Please advise of what brace to give pt

## 2021-01-21 NOTE — Telephone Encounter (Signed)
Usually I just have them wear an alumafoam splint on the finger at night for 1-2 weeks.

## 2021-01-21 NOTE — Telephone Encounter (Signed)
Tried calling pt to discuss. No answer and mailbox was full

## 2021-01-24 ENCOUNTER — Ambulatory Visit: Payer: Medicare HMO | Admitting: Orthopaedic Surgery

## 2021-01-24 ENCOUNTER — Encounter: Payer: Self-pay | Admitting: Orthopaedic Surgery

## 2021-01-24 DIAGNOSIS — M65331 Trigger finger, right middle finger: Secondary | ICD-10-CM

## 2021-01-24 MED ORDER — METHYLPREDNISOLONE 4 MG PO TBPK
ORAL_TABLET | ORAL | 0 refills | Status: DC
Start: 2021-01-24 — End: 2021-03-27

## 2021-01-24 NOTE — Progress Notes (Signed)
Office Visit Note   Patient: Shawna Thompson           Date of Birth: 1946-06-05           MRN: [id number] Visit Date: 01/24/2021              Requested by: Nolene Ebbs, MD [address] [place],  [place] [address] PCP: Nolene Ebbs, MD   Assessment & Plan: Visit Diagnoses:  1. Trigger finger, right middle finger     Plan: Impression is recurrent right long trigger finger.  Based on our discussion of treatment options she would like to have this surgically corrected sometime in May or June but in the meantime she would like to wear an AlumaFoam brace which I explained should only be worn at nighttime to prevent stiffness to the finger.  We will also try a short course of Medrol Dosepak.  We look forward to treating her in the operating room.  Risk benefits rehab recovery reviewed in detail regarding the surgery.  Follow-Up Instructions: Return if symptoms worsen or fail to improve.   Orders:  No orders of the defined types were placed in this encounter.  Meds ordered this encounter  Medications  . methylPREDNISolone (MEDROL DOSEPAK) 4 MG TBPK tablet    Sig: Take as directed    Dispense:  21 tablet    Refill:  0      Procedures: No procedures performed   Clinical Data: No additional findings.   Subjective: Chief Complaint  Patient presents with  . Right Middle Finger - Pain    Shawna Thompson is a 75 year old female who returns today for recurrent right long trigger finger.  We did an injection on November 05, 2020 which only gave her about 3 weeks of relief.  She is not sure if she wants another injection because of how painful it is.  She is also leaning towards surgical release.   Review of Systems   Objective: Vital Signs: There were no vitals taken for this visit.  Physical Exam  Ortho Exam Right long finger exam is consistent with recurrent trigger finger stenosing tenosynovitis. Specialty Comments:  No specialty comments available.  Imaging: No  results found.   PMFS History: Patient Active Problem List   Diagnosis Date Noted  . Primary osteoarthritis of right knee 12/11/2019  . Primary osteoarthritis of left knee 12/11/2019  . Bilateral primary osteoarthritis of knee 02/28/2019  . Trochanteric bursitis of left hip 10/06/2018  . S/P right knee arthroscopy 08/05/2018  . Morbid obesity ([place]) 08/05/2018  . Body mass index 40.0-44.9, adult ([place]) 08/05/2018  . Chondromalacia, right knee   . Other meniscus derangements, posterior horn of medial meniscus, right knee   . Positive ANA (antinuclear antibody) 06/14/2018  . Chronic pain of right knee 06/14/2018  . Acute medial meniscus tear, right, initial encounter 06/14/2018   Past Medical History:  Diagnosis Date  . Arthritis   . GERD (gastroesophageal reflux disease)   . High cholesterol   . Hypertension   . MMT (medial meniscus tear)    right    Family History  Problem Relation Age of Onset  . Diabetes Mother   . Heart failure Mother   . Heart failure Father     Past Surgical History:  Procedure Laterality Date  . CHOLECYSTECTOMY    . KNEE ARTHROSCOPY WITH MEDIAL MENISECTOMY Right 06/22/2018   Procedure: RIGHT KNEE ARTHROSCOPY WITH MEDIAL MENISCECTOMY, CHONDROPLASTY;  Surgeon: Leandrew Koyanagi, MD;  Location: [facility];  Service: Orthopedics;  Laterality: Right;  . SHOULDER ARTHROSCOPY     removal of bone spur  . TONSILLECTOMY    . TUBAL LIGATION     Social History   Occupational History  . Not on file  Tobacco Use  . Smoking status: Never Smoker  . Smokeless tobacco: Never Used  Vaping Use  . Vaping Use: Never used  Substance and Sexual Activity  . Alcohol use: Yes    Comment: social  . Drug use: Never  . Sexual activity: Not on file

## 2021-03-16 ENCOUNTER — Other Ambulatory Visit: Payer: Self-pay | Admitting: Physician Assistant

## 2021-03-16 MED ORDER — ONDANSETRON HCL 4 MG PO TABS: 4.0000 mg | ORAL_TABLET | Freq: Three times a day (TID) | ORAL | 0 refills | Status: DC | PRN

## 2021-03-16 MED ORDER — HYDROCODONE-ACETAMINOPHEN 5-325 MG PO TABS: 1.0000 | ORAL_TABLET | Freq: Three times a day (TID) | ORAL | 0 refills | Status: DC | PRN

## 2021-03-20 ENCOUNTER — Other Ambulatory Visit: Payer: Self-pay

## 2021-03-20 ENCOUNTER — Encounter (HOSPITAL_COMMUNITY): Payer: Self-pay

## 2021-03-20 ENCOUNTER — Emergency Department (HOSPITAL_COMMUNITY)
Admission: EM | Admit: 2021-03-20 | Discharge: 2021-03-20 | Disposition: A | Discharge status: Home / Self Care | Payer: Medicare HMO | Attending: Emergency Medicine | Admitting: Emergency Medicine

## 2021-03-20 ENCOUNTER — Emergency Department (HOSPITAL_COMMUNITY): Payer: Medicare HMO

## 2021-03-20 DIAGNOSIS — I10 Essential (primary) hypertension: Secondary | ICD-10-CM | POA: Diagnosis not present

## 2021-03-20 DIAGNOSIS — S0990XA Unspecified injury of head, initial encounter: Secondary | ICD-10-CM

## 2021-03-20 DIAGNOSIS — S7002XA Contusion of left hip, initial encounter: Secondary | ICD-10-CM

## 2021-03-20 DIAGNOSIS — M545 Low back pain, unspecified: Secondary | ICD-10-CM | POA: Insufficient documentation

## 2021-03-20 DIAGNOSIS — W01198A Fall on same level from slipping, tripping and stumbling with subsequent striking against other object, initial encounter: Secondary | ICD-10-CM | POA: Insufficient documentation

## 2021-03-20 DIAGNOSIS — Y92002 Bathroom of unspecified non-institutional (private) residence single-family (private) house as the place of occurrence of the external cause: Secondary | ICD-10-CM | POA: Diagnosis not present

## 2021-03-20 DIAGNOSIS — M25552 Pain in left hip: Secondary | ICD-10-CM | POA: Insufficient documentation

## 2021-03-20 DIAGNOSIS — Z79899 Other long term (current) drug therapy: Secondary | ICD-10-CM | POA: Insufficient documentation

## 2021-03-20 DIAGNOSIS — R519 Headache, unspecified: Secondary | ICD-10-CM | POA: Insufficient documentation

## 2021-03-20 NOTE — ED Triage Notes (Signed)
Pt to ED by POV from home following fall at approx 10am this morning in her bathroom. Pt states she slipped while showering and struck the left side of her head(no bruising of swelling noted) on the commode. Pt also reports striking left hip, bruising noted. Pt reports "tingling" in her left foot. Arrives ambulatory with a steady gait. BP is elevated, all other VSS, NADN.

## 2021-03-20 NOTE — ED Provider Notes (Signed)
[facility] DEPT Provider Note   CSN: [id number] Arrival date & time: 03/20/21  2006     History Chief Complaint  Patient presents with   [facility] Shawna Thompson is a 75 y.o. female.  75 year old female presents after chemical fall earlier this morning while getting into the shower.  Struck her head but did not have any LOC.  She does not take any blood thinners.  Has had no neck pain.  No headache or nausea or vomiting since the incident.  Does complain of some left-sided hip pain which radiates down to her leg.  Denies any trouble walking.  No lower back pain.  No rib discomfort.  No treatment use prior to arrival      Past Medical History:  Diagnosis Date   Arthritis    GERD (gastroesophageal reflux disease)    High cholesterol    Hypertension    MMT (medial meniscus tear)    right    Patient Active Problem List   Diagnosis Date Noted   Primary osteoarthritis of right knee 12/11/2019   Primary osteoarthritis of left knee 12/11/2019   Bilateral primary osteoarthritis of knee 02/28/2019   Trochanteric bursitis of left hip 10/06/2018   S/P right knee arthroscopy 08/05/2018   Morbid obesity ([place]) 08/05/2018   Body mass index 40.0-44.9, adult ([place]) 08/05/2018   Chondromalacia, right knee    Other meniscus derangements, posterior horn of medial meniscus, right knee    Positive ANA (antinuclear antibody) 06/14/2018   Chronic pain of right knee 06/14/2018   Acute medial meniscus tear, right, initial encounter 06/14/2018    Past Surgical History:  Procedure Laterality Date   CHOLECYSTECTOMY     KNEE ARTHROSCOPY WITH MEDIAL MENISECTOMY Right 06/22/2018   Procedure: RIGHT KNEE ARTHROSCOPY WITH MEDIAL MENISCECTOMY, CHONDROPLASTY;  Surgeon: Leandrew Koyanagi, MD;  Location: [facility];  Service: Orthopedics;  Laterality: Right;   SHOULDER ARTHROSCOPY     removal of bone spur   TONSILLECTOMY     TUBAL LIGATION       OB History    No obstetric history on file.     Family History  Problem Relation Age of Onset   Diabetes Mother    Heart failure Mother    Heart failure Father     Social History   Tobacco Use   Smoking status: Never   Smokeless tobacco: Never  Vaping Use   Vaping Use: Never used  Substance Use Topics   Alcohol use: Yes    Comment: social   Drug use: Never    Home Medications Prior to Admission medications   Medication Sig Start Date End Date Taking? Authorizing Provider  celecoxib (CELEBREX) 200 MG capsule TAKE 1 CAPSULE(200 MG) BY MOUTH TWICE DAILY 10/31/18   Leandrew Koyanagi, MD  cyclobenzaprine (FLEXERIL) 5 MG tablet Take 1-2 tablets (5-10 mg total) by mouth 3 (three) times daily as needed for muscle spasms. 03/14/19   Leandrew Koyanagi, MD  diclofenac sodium (VOLTAREN) 1 % GEL Apply 4 g topically 4 (four) times daily. 03/12/19   Petrucelli, Samantha R, PA-C  esomeprazole (NEXIUM) 20 MG capsule Take 20 mg by mouth 2 (two) times daily before a meal.    [provider]  famotidine (PEPCID) 20 MG tablet  07/19/20   [provider]  furosemide (LASIX) 20 MG tablet  04/01/20   [provider]  hydrochlorothiazide (HYDRODIURIL) 25 MG tablet  07/19/20   [provider]  HYDROcodone-acetaminophen (NORCO) 5-325 MG tablet Take 1 tablet by mouth 3 (three) times daily as needed. To be taken as needed after surgery 03/16/21   Aundra Dubin, PA-C  lisinopril (PRINIVIL,ZESTRIL) 20 MG tablet Take 20 mg by mouth 2 (two) times daily. 02/14/18   [provider]  meclizine (ANTIVERT) 25 MG tablet Take 25 mg by mouth every 8 (eight) hours as needed for dizziness.    [provider]  meloxicam (MOBIC) 7.5 MG tablet TAKE 1 TABLET BY MOUTH DAILY AS NEEDED FOR PAIN 09/29/18   Aundra Dubin, PA-C  methylPREDNISolone (MEDROL DOSEPAK) 4 MG TBPK tablet Take as directed 01/24/21   Leandrew Koyanagi, MD  ondansetron (ZOFRAN) 4 MG tablet Take 1 tablet (4 mg total) by mouth  every 8 (eight) hours as needed for nausea or vomiting. 03/16/21   Aundra Dubin, PA-C  oxybutynin (DITROPAN) 5 MG tablet Take 5 mg by mouth every evening. 02/14/18   [provider]  predniSONE (STERAPRED UNI-PAK 21 TAB) 10 MG (21) TBPK tablet Take as directed 03/14/19   Leandrew Koyanagi, MD  promethazine (PHENERGAN) 25 MG tablet Take 1 tablet (25 mg total) by mouth every 6 (six) hours as needed for nausea. 06/22/18   Leandrew Koyanagi, MD  senna-docusate (SENOKOT S) 8.6-50 MG tablet Take 1 tablet by mouth at bedtime as needed. 06/22/18   Leandrew Koyanagi, MD    Allergies    Patient has no known allergies.  Review of Systems   Review of Systems  All other systems reviewed and are negative.  Physical Exam Updated Vital Signs BP (!) 173/102 (BP Location: Right Arm)   Pulse 87   Temp 97.6 F (36.4 C) (Oral)   Resp 16   Ht 1.549 m (5\' 1" )   Wt 92.1 kg   SpO2 98%   BMI 38.36 kg/m   Physical Exam Vitals and nursing note reviewed.  Constitutional:      General: She is not in acute distress.    Appearance: Normal appearance. She is well-developed. She is not toxic-appearing.  HENT:     Head: Normocephalic and atraumatic.  Eyes:     General: Lids are normal.     Conjunctiva/sclera: Conjunctivae normal.     Pupils: Pupils are equal, round, and reactive to light.  Neck:     Thyroid: No thyroid mass.     Trachea: No tracheal deviation.  Cardiovascular:     Rate and Rhythm: Normal rate and regular rhythm.     Heart sounds: Normal heart sounds. No murmur heard.   No gallop.  Pulmonary:     Effort: Pulmonary effort is normal. No respiratory distress.     Breath sounds: Normal breath sounds. No stridor. No decreased breath sounds, wheezing, rhonchi or rales.  Abdominal:     General: There is no distension.     Palpations: Abdomen is soft.     Tenderness: There is no abdominal tenderness. There is no rebound.  Musculoskeletal:        General: No tenderness. Normal range of motion.      Cervical back: Normal range of motion and neck supple.     Comments: Pain to palpation at left lower lumbar paraspinal  Skin:    General: Skin is warm and dry.     Findings: No abrasion or rash.  Neurological:     Mental Status: She is alert and oriented to person, place, and time. Mental status is at baseline.  GCS: GCS eye subscore is 4. GCS verbal subscore is 5. GCS motor subscore is 6.     Cranial Nerves: Cranial nerves are intact. No cranial nerve deficit.     Sensory: No sensory deficit.     Motor: Motor function is intact.  Psychiatric:        Attention and Perception: Attention normal.        Speech: Speech normal.        Behavior: Behavior normal.    ED Results / Procedures / Treatments   Labs (all labs ordered are listed, but only abnormal results are displayed) Labs Reviewed - No data to display  EKG None  Radiology No results found.  Procedures Procedures   Medications Ordered in ED Medications - No data to display  ED Course  I have reviewed the triage vital signs and the nursing notes.  Pertinent labs & imaging results that were available during my care of the patient were reviewed by me and considered in my medical decision making (see chart for details).    MDM Rules/Calculators/A&P                          X-rays are negative.  Will discharge home Final Clinical Impression(s) / ED Diagnoses Final diagnoses:  None    Rx / DC Orders ED Discharge Orders     None        Lacretia Leigh, MD 03/20/21 2123

## 2021-03-20 NOTE — Discharge Instructions (Addendum)
Use Tylenol or Motrin as directed for pain

## 2021-03-27 ENCOUNTER — Ambulatory Visit (INDEPENDENT_AMBULATORY_CARE_PROVIDER_SITE_OTHER): Payer: Medicare HMO | Admitting: Orthopaedic Surgery

## 2021-03-27 ENCOUNTER — Encounter: Payer: Self-pay | Admitting: Orthopaedic Surgery

## 2021-03-27 ENCOUNTER — Other Ambulatory Visit: Payer: Self-pay

## 2021-03-27 VITALS — Ht 61.0 in | Wt 203.0 lb

## 2021-03-27 DIAGNOSIS — M25552 Pain in left hip: Secondary | ICD-10-CM | POA: Diagnosis not present

## 2021-03-27 NOTE — Progress Notes (Signed)
Office Visit Note   Patient: Shawna Thompson           Date of Birth: 1945-10-14           MRN: [id number] Visit Date: 03/27/2021              Requested by: Nolene Ebbs, MD [address] [place],  [place] [address] PCP: Nolene Ebbs, MD   Assessment & Plan: Visit Diagnoses:  1. Pain in left hip     Plan: Impression is left hip contusion resulting from her fall 1 week ago. Reassurance was provided that this will continue to improve on its own with time. We provided her with the phone number for our surgery scheduler to call and reschedule her right middle trigger finger release. We'll see her back for her hip as needed.   Follow-Up Instructions: Return if symptoms worsen or fail to improve.   Orders:  No orders of the defined types were placed in this encounter.  No orders of the defined types were placed in this encounter.     Procedures: No procedures performed   Clinical Data: No additional findings.   Subjective: Chief Complaint  Patient presents with   Right Hand - Follow-up    Right middle trigger finger    Right Hip - Pain, Injury    DOI 03/20/2021    HPI Shawna Thompson is a 75 y.o. female who presents for evaluation of left hip pain. She slipped in the shower and hit her lateral hip on the side of the tub. She was seen in the ED where x-rays were negative. Today she complains of lateral hip pain made worse with lying on her side. She endorses bruising, swelling and a popping sensation when she stands up. She is able to put weight on the left leg and does not walk with a limp. No numbness, tingling or weakness in the left leg. No previous treatment. Of note, she was scheduled to undergo a right middle trigger finger release on 6/16, but needed to cancel for financial reasons. She'd like to reschedule this surgery.   Review of Systems Review of Systems was reviewed and negative unless as stated in the HPI.  Objective: Vital Signs: Ht 5\' 1"   (1.549 m)   Wt 203 lb (92.1 kg)   BMI 38.36 kg/m   Physical Exam  Ortho Exam Left hip exam demonstrates ecchymosis and swelling over the lateral hip. Hip ROM is pain free and within normal limits. Active hip abduction intact. Normal gait. Distal neurovascular exam intact.   Specialty Comments:  No specialty comments available.  Imaging: Radiographs of the left hip dated 03/20/21 reviewed and demonstrate calcification of the gluteal tendon insertion. No acute injuries.    PMFS History: Patient Active Problem List   Diagnosis Date Noted   Primary osteoarthritis of right knee 12/11/2019   Primary osteoarthritis of left knee 12/11/2019   Bilateral primary osteoarthritis of knee 02/28/2019   Trochanteric bursitis of left hip 10/06/2018   S/P right knee arthroscopy 08/05/2018   Morbid obesity ([place]) 08/05/2018   Body mass index 40.0-44.9, adult ([place]) 08/05/2018   Chondromalacia, right knee    Other meniscus derangements, posterior horn of medial meniscus, right knee    Positive ANA (antinuclear antibody) 06/14/2018   Chronic pain of right knee 06/14/2018   Acute medial meniscus tear, right, initial encounter 06/14/2018   Past Medical History:  Diagnosis Date   Arthritis    GERD (gastroesophageal reflux disease)  High cholesterol    Hypertension    MMT (medial meniscus tear)    right    Family History  Problem Relation Age of Onset   Diabetes Mother    Heart failure Mother    Heart failure Father     Past Surgical History:  Procedure Laterality Date   CHOLECYSTECTOMY     KNEE ARTHROSCOPY WITH MEDIAL MENISECTOMY Right 06/22/2018   Procedure: RIGHT KNEE ARTHROSCOPY WITH MEDIAL MENISCECTOMY, CHONDROPLASTY;  Surgeon: Leandrew Koyanagi, MD;  Location: [facility];  Service: Orthopedics;  Laterality: Right;   SHOULDER ARTHROSCOPY     removal of bone spur   TONSILLECTOMY     TUBAL LIGATION     Social History   Occupational History   Not on file  Tobacco Use    Smoking status: Never   Smokeless tobacco: Never  Vaping Use   Vaping Use: Never used  Substance and Sexual Activity   Alcohol use: Yes    Comment: social   Drug use: Never   Sexual activity: Not on file

## 2021-04-15 ENCOUNTER — Other Ambulatory Visit: Payer: Self-pay | Admitting: Physician Assistant

## 2021-04-17 ENCOUNTER — Encounter: Payer: Self-pay | Admitting: Orthopaedic Surgery

## 2021-04-17 DIAGNOSIS — M65331 Trigger finger, right middle finger: Secondary | ICD-10-CM | POA: Diagnosis not present

## 2021-04-24 ENCOUNTER — Ambulatory Visit (INDEPENDENT_AMBULATORY_CARE_PROVIDER_SITE_OTHER): Payer: Medicare HMO | Admitting: Physician Assistant

## 2021-04-24 ENCOUNTER — Encounter: Payer: Self-pay | Admitting: Orthopaedic Surgery

## 2021-04-24 ENCOUNTER — Other Ambulatory Visit: Payer: Self-pay

## 2021-04-24 DIAGNOSIS — M65331 Trigger finger, right middle finger: Secondary | ICD-10-CM

## 2021-04-24 NOTE — Progress Notes (Signed)
   Post-Op Visit Note   Patient: Shawna Thompson           Date of Birth: 1946/04/06           MRN: [id number] Visit Date: 04/24/2021 PCP: Nolene Ebbs, MD   Assessment & Plan:  Chief Complaint:  Chief Complaint  Patient presents with   Right Middle Finger - Routine Post Op   Visit Diagnoses:  1. Trigger finger, right middle finger     Plan: Patient is a pleasant 75 year old female who comes in today 1 week out right long trigger finger release, date of surgery 04/17/2021.  She has been doing well.  No pain.  Examination of her right hand reveals a well-healing surgical incision with nylon sutures in place.  No evidence of infection or cellulitis.  Fingers are warm well perfused.  Today, her wound was cleaned and recovered.  She will follow-up with [place] next week for suture removal.  No heavy lifting or submerging her hand underwater for another few weeks.  Call with concerns or questions.  Follow-Up Instructions: Return in about 1 week (around 05/01/2021).   Orders:  No orders of the defined types were placed in this encounter.  No orders of the defined types were placed in this encounter.   Imaging: No new imaging  PMFS History: Patient Active Problem List   Diagnosis Date Noted   Trigger finger, right middle finger 04/17/2021   Primary osteoarthritis of right knee 12/11/2019   Primary osteoarthritis of left knee 12/11/2019   Bilateral primary osteoarthritis of knee 02/28/2019   Trochanteric bursitis of left hip 10/06/2018   Morbid obesity ([place]) 08/05/2018   Body mass index 40.0-44.9, adult ([place]) 08/05/2018   Chondromalacia, right knee    Positive ANA (antinuclear antibody) 06/14/2018   Chronic pain of right knee 06/14/2018   Acute medial meniscus tear, right, initial encounter 06/14/2018   Past Medical History:  Diagnosis Date   Arthritis    GERD (gastroesophageal reflux disease)    High cholesterol    Hypertension    MMT (medial meniscus tear)    right     Family History  Problem Relation Age of Onset   Diabetes Mother    Heart failure Mother    Heart failure Father     Past Surgical History:  Procedure Laterality Date   CHOLECYSTECTOMY     KNEE ARTHROSCOPY WITH MEDIAL MENISECTOMY Right 06/22/2018   Procedure: RIGHT KNEE ARTHROSCOPY WITH MEDIAL MENISCECTOMY, CHONDROPLASTY;  Surgeon: Leandrew Koyanagi, MD;  Location: [place];  Service: Orthopedics;  Laterality: Right;   SHOULDER ARTHROSCOPY     removal of bone spur   TONSILLECTOMY     TUBAL LIGATION     Social History   Occupational History   Not on file  Tobacco Use   Smoking status: Never   Smokeless tobacco: Never  Vaping Use   Vaping Use: Never used  Substance and Sexual Activity   Alcohol use: Yes    Comment: social   Drug use: Never   Sexual activity: Not on file

## 2021-04-27 ENCOUNTER — Emergency Department (HOSPITAL_COMMUNITY)
Admission: EM | Admit: 2021-04-27 | Discharge: 2021-04-27 | Discharge status: Against Medical Advice | Payer: Medicare HMO

## 2021-04-27 NOTE — ED Notes (Signed)
Patient stated that she was going to [facility].

## 2021-05-02 ENCOUNTER — Other Ambulatory Visit: Payer: Self-pay

## 2021-05-02 ENCOUNTER — Encounter: Payer: Self-pay | Admitting: Orthopaedic Surgery

## 2021-05-02 ENCOUNTER — Ambulatory Visit (INDEPENDENT_AMBULATORY_CARE_PROVIDER_SITE_OTHER): Payer: Medicare HMO | Admitting: Physician Assistant

## 2021-05-02 VITALS — Ht 61.0 in | Wt 203.0 lb

## 2021-05-02 DIAGNOSIS — M65331 Trigger finger, right middle finger: Secondary | ICD-10-CM

## 2021-05-02 NOTE — Progress Notes (Signed)
   Post-Op Visit Note   Patient: Shawna Thompson           Date of Birth: [birth year]           MRN: [id number] Visit Date: 05/02/2021 PCP: Nolene Ebbs, MD   Assessment & Plan:  Chief Complaint:  Chief Complaint  Patient presents with   Right Hand - Follow-up    Right long trigger finger release 04/17/2021   Visit Diagnoses:  1. Trigger finger, right middle finger     Plan: Patient is a pleasant 75 year old female who comes in today 2 weeks status post right long trigger finger release 04/17/2021.  She has been doing well.  She notes mild soreness but nothing more.  Examination of her right long finger reveals a fully healed surgical scar with nylon sutures in place.  No evidence of infection or cellulitis.  Fingers warm well perfused.  Today, sutures were removed and Steri-Strips applied.  No heavy lifting or submerging her hand in water for another 2 weeks.  Follow-up with [place] in 4 weeks time for final check.  Call with concerns or questions in the meantime.  Follow-Up Instructions: Return in about 4 weeks (around 05/30/2021).   Orders:  No orders of the defined types were placed in this encounter.  No orders of the defined types were placed in this encounter.   Imaging: No new imaging  PMFS History: Patient Active Problem List   Diagnosis Date Noted   Trigger finger, right middle finger 04/17/2021   Primary osteoarthritis of right knee 12/11/2019   Primary osteoarthritis of left knee 12/11/2019   Bilateral primary osteoarthritis of knee 02/28/2019   Trochanteric bursitis of left hip 10/06/2018   Morbid obesity ([place]) 08/05/2018   Body mass index 40.0-44.9, adult ([place]) 08/05/2018   Chondromalacia, right knee    Positive ANA (antinuclear antibody) 06/14/2018   Chronic pain of right knee 06/14/2018   Acute medial meniscus tear, right, initial encounter 06/14/2018   Past Medical History:  Diagnosis Date   Arthritis    GERD (gastroesophageal reflux disease)    High  cholesterol    Hypertension    MMT (medial meniscus tear)    right    Family History  Problem Relation Age of Onset   Diabetes Mother    Heart failure Mother    Heart failure Father     Past Surgical History:  Procedure Laterality Date   CHOLECYSTECTOMY     KNEE ARTHROSCOPY WITH MEDIAL MENISECTOMY Right 06/22/2018   Procedure: RIGHT KNEE ARTHROSCOPY WITH MEDIAL MENISCECTOMY, CHONDROPLASTY;  Surgeon: Leandrew Koyanagi, MD;  Location: [place];  Service: Orthopedics;  Laterality: Right;   SHOULDER ARTHROSCOPY     removal of bone spur   TONSILLECTOMY     TUBAL LIGATION     Social History   Occupational History   Not on file  Tobacco Use   Smoking status: Never   Smokeless tobacco: Never  Vaping Use   Vaping Use: Never used  Substance and Sexual Activity   Alcohol use: Yes    Comment: social   Drug use: Never   Sexual activity: Not on file

## 2021-05-23 ENCOUNTER — Telehealth: Payer: Self-pay | Admitting: Orthopaedic Surgery

## 2021-05-23 ENCOUNTER — Telehealth: Payer: Self-pay

## 2021-05-23 NOTE — Telephone Encounter (Signed)
Tried patient again. Mailbox is full.

## 2021-05-23 NOTE — Telephone Encounter (Signed)
VOB submitted for Durolane, bilateral knee. Pending BV.

## 2021-05-23 NOTE — Telephone Encounter (Signed)
Tried calling patient to verify which knee for gel injection, but no answer and VM was to full to leave a message.

## 2021-05-23 NOTE — Telephone Encounter (Signed)
Pt called wondering if she can get a gel injection by her appt on the 25th. I told her it takes about two week for it to be approved. She would like someone to give her a call back.   CB [phone]

## 2021-05-26 ENCOUNTER — Telehealth: Payer: Self-pay

## 2021-05-26 NOTE — Telephone Encounter (Signed)
Talked with patient and was advised that she will come to her appt.on 05/29/2021.

## 2021-05-26 NOTE — Telephone Encounter (Signed)
Approved for Durolane, bilateral knee. [place] Patient will be responsible for 20% OOP. Co-pay of $45.00 No PA required  Appt. 05/29/2021 with Dr. Erlinda Hong.

## 2021-05-26 NOTE — Telephone Encounter (Signed)
Pt had a cortisone shot done on Friday and wanted to know if she could keep this upcoming appt or if she needs to resch. The best call back number is [phone].

## 2021-05-29 ENCOUNTER — Other Ambulatory Visit: Payer: Self-pay

## 2021-05-29 ENCOUNTER — Ambulatory Visit (INDEPENDENT_AMBULATORY_CARE_PROVIDER_SITE_OTHER): Payer: Medicare Other | Admitting: Orthopaedic Surgery

## 2021-05-29 ENCOUNTER — Encounter: Payer: Self-pay | Admitting: Orthopaedic Surgery

## 2021-05-29 DIAGNOSIS — M1712 Unilateral primary osteoarthritis, left knee: Secondary | ICD-10-CM

## 2021-05-29 MED ORDER — LIDOCAINE HCL 1 % IJ SOLN
2.0000 mL | INTRAMUSCULAR | Status: AC | PRN
  Administered 2021-05-29: 2 mL

## 2021-05-29 MED ORDER — SODIUM HYALURONATE 60 MG/3ML IX PRSY
60.0000 mg | PREFILLED_SYRINGE | INTRA_ARTICULAR | Status: AC | PRN
Start: 2021-05-29 — End: 2021-05-29
  Administered 2021-05-29: 60 mg via INTRA_ARTICULAR

## 2021-05-29 MED ORDER — BUPIVACAINE HCL 0.25 % IJ SOLN
2.0000 mL | INTRAMUSCULAR | Status: AC | PRN
  Administered 2021-05-29: 2 mL via INTRA_ARTICULAR

## 2021-05-29 NOTE — Progress Notes (Signed)
Office Visit Note   Patient: Shawna Thompson           Date of Birth: [birth year]           MRN: [id number] Visit Date: 05/29/2021              Requested by: Nolene Ebbs, MD [address] [place],  [place] [address] PCP: Nolene Ebbs, MD   Assessment & Plan: Visit Diagnoses:  1. Unilateral primary osteoarthritis, left knee     Plan: Impression is left knee degenerative joint disease.  Today we proceeded with left knee Durolane injection.  She tolerated this well.  She will follow-up with [place] once we have the right knee durolane injection in stock at the office.   Follow-Up Instructions: Return if symptoms worsen or fail to improve.   Orders:  Orders Placed This Encounter  Procedures   Large Joint Inj    No orders of the defined types were placed in this encounter.     Procedures: Large Joint Inj: L knee on 05/29/2021 9:16 AM Indications: pain Details: 22 G needle, anterolateral approach Medications: 2 mL lidocaine 1 %; 2 mL bupivacaine 0.25 %; 60 mg Sodium Hyaluronate 60 MG/3ML     Clinical Data: No additional findings.   Subjective: Chief Complaint  Patient presents with   Right Knee - Pain   Left Knee - Pain   Left Hip - Pain    HPI patient is a pleasant 75 year old female who comes in today for her left knee Durolane injection.  She has had these before with relief for a couple months.     Objective: Vital Signs: There were no vitals taken for this visit.    Ortho Exam stable left knee exam  Specialty Comments:  No specialty comments available.  Imaging: No new imaging   PMFS History: Patient Active Problem List   Diagnosis Date Noted   Trigger finger, right middle finger 04/17/2021   Primary osteoarthritis of right knee 12/11/2019   Primary osteoarthritis of left knee 12/11/2019   Bilateral primary osteoarthritis of knee 02/28/2019   Trochanteric bursitis of left hip 10/06/2018   Morbid obesity ([place]) 08/05/2018   Body mass  index 40.0-44.9, adult ([place]) 08/05/2018   Chondromalacia, right knee    Positive ANA (antinuclear antibody) 06/14/2018   Chronic pain of right knee 06/14/2018   Acute medial meniscus tear, right, initial encounter 06/14/2018   Past Medical History:  Diagnosis Date   Arthritis    GERD (gastroesophageal reflux disease)    High cholesterol    Hypertension    MMT (medial meniscus tear)    right    Family History  Problem Relation Age of Onset   Diabetes Mother    Heart failure Mother    Heart failure Father     Past Surgical History:  Procedure Laterality Date   CHOLECYSTECTOMY     KNEE ARTHROSCOPY WITH MEDIAL MENISECTOMY Right 06/22/2018   Procedure: RIGHT KNEE ARTHROSCOPY WITH MEDIAL MENISCECTOMY, CHONDROPLASTY;  Surgeon: Leandrew Koyanagi, MD;  Location: [place];  Service: Orthopedics;  Laterality: Right;   SHOULDER ARTHROSCOPY     removal of bone spur   TONSILLECTOMY     TUBAL LIGATION     Social History   Occupational History   Not on file  Tobacco Use   Smoking status: Never   Smokeless tobacco: Never  Vaping Use   Vaping Use: Never used  Substance and Sexual Activity   Alcohol use: Yes  Comment: social   Drug use: Never   Sexual activity: Not on file

## 2021-10-07 ENCOUNTER — Emergency Department (HOSPITAL_BASED_OUTPATIENT_CLINIC_OR_DEPARTMENT_OTHER): Payer: Medicare PPO | Admitting: Radiology

## 2021-10-07 ENCOUNTER — Emergency Department (HOSPITAL_BASED_OUTPATIENT_CLINIC_OR_DEPARTMENT_OTHER)
Admission: EM | Admit: 2021-10-07 | Discharge: 2021-10-07 | Disposition: A | Discharge status: Home / Self Care | Payer: Medicare PPO | Attending: Emergency Medicine | Admitting: Emergency Medicine

## 2021-10-07 ENCOUNTER — Encounter (HOSPITAL_BASED_OUTPATIENT_CLINIC_OR_DEPARTMENT_OTHER): Payer: Self-pay | Admitting: Emergency Medicine

## 2021-10-07 ENCOUNTER — Emergency Department (HOSPITAL_BASED_OUTPATIENT_CLINIC_OR_DEPARTMENT_OTHER): Payer: Medicare PPO

## 2021-10-07 ENCOUNTER — Other Ambulatory Visit: Payer: Self-pay

## 2021-10-07 DIAGNOSIS — I6930 Unspecified sequelae of cerebral infarction: Secondary | ICD-10-CM

## 2021-10-07 DIAGNOSIS — I6350 Cerebral infarction due to unspecified occlusion or stenosis of unspecified cerebral artery: Secondary | ICD-10-CM | POA: Insufficient documentation

## 2021-10-07 DIAGNOSIS — M25511 Pain in right shoulder: Secondary | ICD-10-CM | POA: Insufficient documentation

## 2021-10-07 DIAGNOSIS — W010XXA Fall on same level from slipping, tripping and stumbling without subsequent striking against object, initial encounter: Secondary | ICD-10-CM | POA: Diagnosis not present

## 2021-10-07 DIAGNOSIS — M25561 Pain in right knee: Secondary | ICD-10-CM | POA: Insufficient documentation

## 2021-10-07 DIAGNOSIS — S0990XA Unspecified injury of head, initial encounter: Secondary | ICD-10-CM | POA: Insufficient documentation

## 2021-10-07 DIAGNOSIS — M25512 Pain in left shoulder: Secondary | ICD-10-CM | POA: Insufficient documentation

## 2021-10-07 DIAGNOSIS — M545 Low back pain, unspecified: Secondary | ICD-10-CM | POA: Diagnosis not present

## 2021-10-07 DIAGNOSIS — M25562 Pain in left knee: Secondary | ICD-10-CM | POA: Insufficient documentation

## 2021-10-07 DIAGNOSIS — M7989 Other specified soft tissue disorders: Secondary | ICD-10-CM | POA: Insufficient documentation

## 2021-10-07 DIAGNOSIS — W19XXXA Unspecified fall, initial encounter: Secondary | ICD-10-CM

## 2021-10-07 MED ORDER — ACETAMINOPHEN 325 MG PO TABS
650.0000 mg | ORAL_TABLET | Freq: Once | ORAL | Status: AC
  Administered 2021-10-07: 650 mg via ORAL
  Filled 2021-10-07: qty 2

## 2021-10-07 NOTE — ED Provider Notes (Signed)
[facility] EMERGENCY DEPT Provider Note   CSN: [id number] Arrival date & time: 10/07/21  0957     History  Chief Complaint  Patient presents with   [facility] Shawna Thompson is a 76 y.o. female.  HPI     76 year old female comes in with chief complaint of fall.  Patient had a mechanical fall earlier today.  She is complaining of pain in both of her shoulders, lower back and knees.  She was unable to get up after the fall.  She does not think she lost consciousness.  Patient denies any severe nausea, vomiting, headaches and denies any one-sided numbness or weakness.  She has no chest pain or shortness of breath.  Patient is not on any blood thinners.  Home Medications Prior to Admission medications   Medication Sig Start Date End Date Taking? Authorizing Provider  esomeprazole (NEXIUM) 20 MG capsule Take 20 mg by mouth 2 (two) times daily before a meal.   Yes [provider]  famotidine (PEPCID) 20 MG tablet  07/19/20  Yes [provider]  furosemide (LASIX) 20 MG tablet  04/01/20  Yes [provider]  lisinopril (PRINIVIL,ZESTRIL) 20 MG tablet Take 20 mg by mouth 2 (two) times daily. 02/14/18  Yes [provider]  meclizine (ANTIVERT) 25 MG tablet Take 25 mg by mouth every 8 (eight) hours as needed for dizziness.   Yes [provider]  celecoxib (CELEBREX) 200 MG capsule TAKE 1 CAPSULE(200 MG) BY MOUTH TWICE DAILY Patient not taking: Reported on 10/07/2021 10/31/18   Leandrew Koyanagi, MD  cyclobenzaprine (FLEXERIL) 5 MG tablet Take 1-2 tablets (5-10 mg total) by mouth 3 (three) times daily as needed for muscle spasms. Patient not taking: Reported on 10/07/2021 03/14/19   Leandrew Koyanagi, MD  diclofenac sodium (VOLTAREN) 1 % GEL Apply 4 g topically 4 (four) times daily. Patient not taking: Reported on 10/07/2021 03/12/19   Petrucelli, Glynda Jaeger, PA-C  HYDROcodone-acetaminophen (NORCO) 5-325 MG tablet Take 1 tablet by mouth 3 (three)  times daily as needed. To be taken as needed after surgery Patient not taking: Reported on 10/07/2021 03/16/21   Aundra Dubin, PA-C  meloxicam (MOBIC) 7.5 MG tablet TAKE 1 TABLET BY MOUTH DAILY AS NEEDED FOR PAIN Patient not taking: Reported on 10/07/2021 09/29/18   Aundra Dubin, PA-C  ondansetron (ZOFRAN) 4 MG tablet Take 1 tablet (4 mg total) by mouth every 8 (eight) hours as needed for nausea or vomiting. Patient not taking: Reported on 10/07/2021 03/16/21   Aundra Dubin, PA-C  promethazine (PHENERGAN) 25 MG tablet Take 1 tablet (25 mg total) by mouth every 6 (six) hours as needed for nausea. Patient not taking: Reported on 10/07/2021 06/22/18   Leandrew Koyanagi, MD  senna-docusate (SENOKOT S) 8.6-50 MG tablet Take 1 tablet by mouth at bedtime as needed. Patient not taking: Reported on 10/07/2021 06/22/18   Leandrew Koyanagi, MD      Allergies    Patient has no known allergies.    Review of Systems   Review of Systems  Constitutional:  Positive for activity change.  Respiratory:  Negative for shortness of breath.   Cardiovascular:  Negative for chest pain.  Musculoskeletal:  Positive for arthralgias.  Hematological:  Does not bruise/bleed easily.   Physical Exam Updated Vital Signs BP (!) 179/70 (BP Location: Right Arm)    Pulse 74    Temp 98.1 F (36.7 C) (Oral)    Resp 19  Ht 5' (1.524 m)    Wt 90.7 kg    SpO2 100%    BMI 39.06 kg/m  Physical Exam Vitals and nursing note reviewed.  Constitutional:      Appearance: She is well-developed.  HENT:     Head: Atraumatic.  Neck:     Comments: No midline c-spine tenderness, pt able to turn head to 45 degrees bilaterally without any pain and able to flex neck to the chest and extend without any pain or neurologic symptoms.  Cardiovascular:     Rate and Rhythm: Normal rate.  Pulmonary:     Effort: Pulmonary effort is normal.  Musculoskeletal:        General: Swelling, tenderness and signs of injury present. No deformity.     Cervical  back: Normal range of motion and neck supple.     Comments: Patient able to flex and extend over the ankle, knee, hip, shoulder and elbows without any problem.  No gross deformity.  Patient has midline lower lumbar spine tenderness  Skin:    General: Skin is warm and dry.  Neurological:     Mental Status: She is alert and oriented to person, place, and time.    ED Results / Procedures / Treatments   Labs (all labs ordered are listed, but only abnormal results are displayed) Labs Reviewed - No data to display  EKG None  Radiology DG Lumbar Spine Complete  Result Date: 10/07/2021 CLINICAL DATA:  Fall, left back pain EXAM: LUMBAR SPINE - COMPLETE 4+ VIEW COMPARISON:  None. FINDINGS: Grade 1 anterolisthesis at L4-L5. Vertebral body heights are maintained. No acute fracture identified. No substantial disc space narrowing. Mild lower lumbar facet hypertrophy. IMPRESSION: No acute fracture. Electronically Signed   By: Macy Mis M.D.   On: 10/07/2021 13:50   CT Head Wo Contrast  Result Date: 10/07/2021 CLINICAL DATA:  Head trauma, minor (Age >= 65y); fall EXAM: CT HEAD WITHOUT CONTRAST TECHNIQUE: Contiguous axial images were obtained from the base of the skull through the vertex without intravenous contrast. COMPARISON:  03/20/2021 FINDINGS: Brain: There is no acute intracranial hemorrhage, mass effect, or edema. Gray-white differentiation is preserved. Patchy hypoattenuation in the supratentorial white matter is nonspecific but may reflect similar minor chronic microvascular ischemic changes. Left larger than right cerebellar infarcts. Unchanged relative prominence of the extra-axial space underlying the left tentorial leaflet. Ventricles and sulci are within normal limits in size and configuration. Vascular: There is atherosclerotic calcification at the skull base. Skull: Calvarium is unremarkable. Sinuses/Orbits: No acute finding. Other: None. IMPRESSION: No evidence of acute intracranial  injury. Chronic/nonemergent findings detailed above. Electronically Signed   By: Macy Mis M.D.   On: 10/07/2021 13:54    Procedures Procedures    Medications Ordered in ED Medications  acetaminophen (TYLENOL) tablet 650 mg (650 mg Oral Given 10/07/21 1352)    ED Course/ Medical Decision Making/ A&P Clinical Course as of 10/07/21 1451  Tue Oct 07, 2021  1450 CT Head Wo Contrast Discussed with the patient the finding of old strokes.  She reports that she and her doctors are aware of it.  She does not take aspirin regularly, counseled her to start taking aspirin regularly and to discuss with her PCP if there is any further stroke prevention work-up or counseling needed. [AN]  1450 DG Lumbar Spine Complete CT and x-rays were independently reviewed by me from trauma perspective.  No evidence of brain bleed or spine fracture. [AN]    Clinical Course User Index [  AN] Shawna Biles, MD                           Medical Decision Making Problems Addressed: Chronic arterial ischemic stroke: undiagnosed new problem with uncertain prognosis Fall, initial encounter: self-limited or minor problem  Amount and/or Complexity of Data Reviewed External Data Reviewed: radiology.    Details: CT scan from 6/22 Radiology: ordered and independent interpretation performed.    Details: no bleed or spine fracture  Risk OTC drugs.   76 year old female comes in with chief complaint of fall. She had a mechanical fall earlier today.  CT scan of the brain ordered given her age to ensure there is no subdural hemorrhage.  C-spine has been cleared using the [place] CT C-spine rule.  On our musculoskeletal survey, patient does not have any focal tenderness or deformity besides lumbar spine area.  She does have some bilateral shoulder, knee arthralgia which appears to be not secondary to fractures, x-rays will not help with further diagnosis as we suspect the symptoms are more soft tissue  related.  Plans to get x-ray of the lumbar spine.  If work-up is reassuring then she can be discharged.        Final Clinical Impression(s) / ED Diagnoses Final diagnoses:  Chronic arterial ischemic stroke  Fall, initial encounter    Rx / DC Orders ED Discharge Orders     None         Shawna Biles, MD 10/07/21 1459

## 2021-10-07 NOTE — ED Triage Notes (Signed)
Tripped and fell today today. C/o bilat shoulder, knee, L hip, back pain.

## 2021-10-07 NOTE — ED Notes (Signed)
Pt ambulatory to the restroom without difficulty.

## 2021-10-07 NOTE — Discharge Instructions (Signed)
As discussed, the CT scan of your brain does show some old strokes.  Please start taking aspirin every day.  Ensure that your primary care doctor is aware of the old strokes and is constantly on stroke prevention.  From the fall perspective, no evidence of brain bleed, skull fracture, spine fracture.  Take over-the-counter Tylenol or ibuprofen for pain control.

## 2021-10-07 NOTE — ED Notes (Signed)
EMT-P provided AVS using Teachback Method. Patient verbalizes understanding of Discharge Instructions. Opportunity for Questioning and Answers were provided by EMT-P. Patient Discharged from ED.  ? ?

## 2021-10-30 ENCOUNTER — Ambulatory Visit: Payer: Medicare PPO | Admitting: Podiatry

## 2021-12-19 ENCOUNTER — Other Ambulatory Visit: Payer: Self-pay | Admitting: Internal Medicine

## 2021-12-20 LAB — COMPLETE METABOLIC PANEL WITH GFR
AG Ratio: 1.6 (calc) (ref 1.0–2.5)
ALT: 12 U/L (ref 6–29)
AST: 16 U/L (ref 10–35)
Albumin: 4.1 g/dL (ref 3.6–5.1)
Alkaline phosphatase (APISO): 95 U/L (ref 37–153)
BUN: 17 mg/dL (ref 7–25)
CO2: 27 mmol/L (ref 20–32)
Calcium: 9.2 mg/dL (ref 8.6–10.4)
Chloride: 104 mmol/L (ref 98–110)
Creat: 0.9 mg/dL (ref 0.60–1.00)
Globulin: 2.6 g/dL (calc) (ref 1.9–3.7)
Glucose, Bld: 81 mg/dL (ref 65–99)
Potassium: 4.2 mmol/L (ref 3.5–5.3)
Sodium: 141 mmol/L (ref 135–146)
Total Bilirubin: 0.5 mg/dL (ref 0.2–1.2)
Total Protein: 6.7 g/dL (ref 6.1–8.1)
eGFR: 68 mL/min/{1.73_m2} (ref >=60)

## 2021-12-20 LAB — LIPID PANEL
Cholesterol: 226 mg/dL — ABNORMAL HIGH (ref <200)
HDL: 67 mg/dL (ref >=50)
LDL Cholesterol (Calc): 142 mg/dL (calc) — ABNORMAL HIGH
Non-HDL Cholesterol (Calc): 159 mg/dL (calc) — ABNORMAL HIGH (ref <130)
Total CHOL/HDL Ratio: 3.4 (calc) (ref <5.0)
Triglycerides: 77 mg/dL (ref <150)

## 2021-12-20 LAB — CBC
HCT: 38.5 % (ref 35.0–45.0)
Hemoglobin: 12.4 g/dL (ref 11.7–15.5)
MCH: 24.8 pg — ABNORMAL LOW (ref 27.0–33.0)
MCHC: 32.2 g/dL (ref 32.0–36.0)
MCV: 77.2 fL — ABNORMAL LOW (ref 80.0–100.0)
MPV: 9.7 fL (ref 7.5–12.5)
Platelets: 296 10*3/uL (ref 140–400)
RBC: 4.99 10*6/uL (ref 3.80–5.10)
RDW: 15.7 % — ABNORMAL HIGH (ref 11.0–15.0)
WBC: 8.9 10*3/uL (ref 3.8–10.8)

## 2021-12-20 LAB — TSH: TSH: 5.12 mIU/L — ABNORMAL HIGH (ref 0.40–4.50)

## 2022-02-03 ENCOUNTER — Encounter (HOSPITAL_BASED_OUTPATIENT_CLINIC_OR_DEPARTMENT_OTHER): Payer: Self-pay

## 2022-02-03 DIAGNOSIS — G4730 Sleep apnea, unspecified: Secondary | ICD-10-CM

## 2022-04-20 ENCOUNTER — Telehealth: Payer: Self-pay | Admitting: Orthopaedic Surgery

## 2022-04-20 NOTE — Telephone Encounter (Signed)
Pt called requesting pain medication. Pt states she wanted a sooner appt for injection. Dr Erlinda Hong has no openings until 7/25 and she is in severe pain. Please send to pharmacy on file. Pt phone number is [phone].

## 2022-04-21 ENCOUNTER — Other Ambulatory Visit: Payer: Self-pay | Admitting: Physician Assistant

## 2022-04-21 MED ORDER — TRAMADOL HCL 50 MG PO TABS: 50.0000 mg | ORAL_TABLET | Freq: Three times a day (TID) | ORAL | 0 refills | Status: DC | PRN

## 2022-04-21 NOTE — Telephone Encounter (Signed)
I sent in tramadol

## 2022-04-28 ENCOUNTER — Ambulatory Visit (INDEPENDENT_AMBULATORY_CARE_PROVIDER_SITE_OTHER): Payer: Medicare PPO

## 2022-04-28 ENCOUNTER — Ambulatory Visit (INDEPENDENT_AMBULATORY_CARE_PROVIDER_SITE_OTHER): Payer: Medicare PPO | Admitting: Orthopaedic Surgery

## 2022-04-28 ENCOUNTER — Encounter: Payer: Self-pay | Admitting: Orthopaedic Surgery

## 2022-04-28 ENCOUNTER — Telehealth: Payer: Self-pay

## 2022-04-28 DIAGNOSIS — M160 Bilateral primary osteoarthritis of hip: Secondary | ICD-10-CM

## 2022-04-28 DIAGNOSIS — M170 Bilateral primary osteoarthritis of knee: Secondary | ICD-10-CM

## 2022-04-28 NOTE — Progress Notes (Signed)
Office Visit Note   Patient: Shawna Thompson           Date of Birth: December 16, 1945           MRN: [id number] Visit Date: 04/28/2022              Requested by: Nolene Ebbs, MD [address] [place],  [place] [address] PCP: Nolene Ebbs, MD   Assessment & Plan: Visit Diagnoses:  1. Primary osteoarthritis of both hips   2. Bilateral primary osteoarthritis of knee     Plan: Impression is bilateral hip and bilateral knee osteoarthritis.  In regards to her hips, I would like to make referral to Dr. Orlene Erm for intra-articular cortisone injections.  In regards to her knees, we will submit for approval for viscosupplementation injections as these have significantly helped in the past.  She will follow-up once approved.  Follow-Up Instructions: No follow-ups on file.   Orders:  Orders Placed This Encounter  Procedures   XR HIPS BILAT W OR W/O PELVIS 3-4 VIEWS   Ambulatory referral to Orthopedic Surgery   No orders of the defined types were placed in this encounter.     Procedures: No procedures performed   Clinical Data: No additional findings.   Subjective: Chief Complaint  Patient presents with   Right Hip - Pain   Left Hip - Pain    HPI patient is a pleasant 76 year old female who comes in today with bilateral hip pain both equally as bad.  Symptoms been ongoing for the past month and is progressively worsened.  The symptoms she has are primarily into the groin but occasionally notices pain down the back of the right thigh.  Symptoms are worse when she is sitting or walking.  She started taking tramadol last Friday which is significantly helped.  She denies any paresthesias to either lower extremity.  She does note a history of a fall on the job back in January for which her back was x-rayed.  Her back is no longer bothering her.  Other issue she brings up today is recurrent bilateral knee pain.  History of osteoarthritis to both knees.  She has had  viscosupplementation injections in the past which have provided good relief.  She would like to repeat these injections if possible.  Review of Systems as detailed in HPI.  All others reviewed and are negative.   Objective: Vital Signs: There were no vitals taken for this visit.  Physical Exam well-developed well-nourished female no acute distress.  Alert and oriented x3.  Ortho Exam bilateral hip exam shows mild pain with logroll left greater than right.  No pain with hip flexion.  Negative straight leg raise both sides.  Unremarkable lumbar spine exam.  No focal weakness.  She is neurovascular tact distally.  Stable bilateral knee exam  Specialty Comments:  No specialty comments available.  Imaging: XR HIPS BILAT W OR W/O PELVIS 3-4 VIEWS  Result Date: 04/28/2022 X-rays demonstrate moderate degenerative changes to both hips    PMFS History: Patient Active Problem List   Diagnosis Date Noted   Trigger finger, right middle finger 04/17/2021   Primary osteoarthritis of right knee 12/11/2019   Primary osteoarthritis of left knee 12/11/2019   Bilateral primary osteoarthritis of knee 02/28/2019   Trochanteric bursitis of left hip 10/06/2018   Morbid obesity ([place]) 08/05/2018   Body mass index 40.0-44.9, adult ([place]) 08/05/2018   Chondromalacia, right knee    Positive ANA (antinuclear antibody) 06/14/2018   Chronic pain  of right knee 06/14/2018   Acute medial meniscus tear, right, initial encounter 06/14/2018   Past Medical History:  Diagnosis Date   Arthritis    GERD (gastroesophageal reflux disease)    High cholesterol    Hypertension    MMT (medial meniscus tear)    right    Family History  Problem Relation Age of Onset   Diabetes Mother    Heart failure Mother    Heart failure Father     Past Surgical History:  Procedure Laterality Date   CHOLECYSTECTOMY     KNEE ARTHROSCOPY WITH MEDIAL MENISECTOMY Right 06/22/2018   Procedure: RIGHT KNEE ARTHROSCOPY WITH MEDIAL  MENISCECTOMY, CHONDROPLASTY;  Surgeon: Leandrew Koyanagi, MD;  Location: [place];  Service: Orthopedics;  Laterality: Right;   SHOULDER ARTHROSCOPY     removal of bone spur   TONSILLECTOMY     TUBAL LIGATION     Social History   Occupational History   Not on file  Tobacco Use   Smoking status: Never   Smokeless tobacco: Never  Vaping Use   Vaping Use: Never used  Substance and Sexual Activity   Alcohol use: Yes    Comment: social   Drug use: Never   Sexual activity: Not on file

## 2022-04-28 NOTE — Telephone Encounter (Signed)
Can we submit for Bil gel injections for patient please

## 2022-04-29 NOTE — Telephone Encounter (Signed)
Submitted for VOB on [email or web address]

## 2022-05-17 ENCOUNTER — Emergency Department (HOSPITAL_BASED_OUTPATIENT_CLINIC_OR_DEPARTMENT_OTHER): Payer: Medicare PPO | Admitting: Radiology

## 2022-05-17 ENCOUNTER — Other Ambulatory Visit: Payer: Self-pay

## 2022-05-17 ENCOUNTER — Emergency Department (HOSPITAL_BASED_OUTPATIENT_CLINIC_OR_DEPARTMENT_OTHER): Payer: Medicare PPO

## 2022-05-17 ENCOUNTER — Encounter (HOSPITAL_BASED_OUTPATIENT_CLINIC_OR_DEPARTMENT_OTHER): Payer: Self-pay

## 2022-05-17 ENCOUNTER — Emergency Department (HOSPITAL_BASED_OUTPATIENT_CLINIC_OR_DEPARTMENT_OTHER)
Admission: EM | Admit: 2022-05-17 | Discharge: 2022-05-17 | Disposition: A | Discharge status: Home / Self Care | Payer: Medicare PPO | Attending: Emergency Medicine | Admitting: Emergency Medicine

## 2022-05-17 DIAGNOSIS — R42 Dizziness and giddiness: Secondary | ICD-10-CM | POA: Diagnosis not present

## 2022-05-17 DIAGNOSIS — R739 Hyperglycemia, unspecified: Secondary | ICD-10-CM | POA: Diagnosis not present

## 2022-05-17 DIAGNOSIS — R030 Elevated blood-pressure reading, without diagnosis of hypertension: Secondary | ICD-10-CM

## 2022-05-17 DIAGNOSIS — Z79899 Other long term (current) drug therapy: Secondary | ICD-10-CM | POA: Diagnosis not present

## 2022-05-17 DIAGNOSIS — I10 Essential (primary) hypertension: Secondary | ICD-10-CM | POA: Insufficient documentation

## 2022-05-17 LAB — BASIC METABOLIC PANEL
Anion gap: 10 (ref 5–15)
BUN: 11 mg/dL (ref 8–23)
CO2: 24 mmol/L (ref 22–32)
Calcium: 9.2 mg/dL (ref 8.9–10.3)
Chloride: 106 mmol/L (ref 98–111)
Creatinine, Ser: 0.89 mg/dL (ref 0.44–1.00)
GFR, Estimated: >60 mL/min (ref >60)
Glucose, Bld: 104 mg/dL — ABNORMAL HIGH (ref 70–99)
Potassium: 3.9 mmol/L (ref 3.5–5.1)
Sodium: 140 mmol/L (ref 135–145)

## 2022-05-17 LAB — TROPONIN I (HIGH SENSITIVITY)
Troponin I (High Sensitivity): 6 ng/L (ref <18)
Troponin I (High Sensitivity): 5 ng/L (ref <18)

## 2022-05-17 LAB — CBC
HCT: 40.4 % (ref 36.0–46.0)
Hemoglobin: 12.8 g/dL (ref 12.0–15.0)
MCH: 24.9 pg — ABNORMAL LOW (ref 26.0–34.0)
MCHC: 31.7 g/dL (ref 30.0–36.0)
MCV: 78.4 fL — ABNORMAL LOW (ref 80.0–100.0)
Platelets: 328 10*3/uL (ref 150–400)
RBC: 5.15 MIL/uL — ABNORMAL HIGH (ref 3.87–5.11)
RDW: 14.4 % (ref 11.5–15.5)
WBC: 8 10*3/uL (ref 4.0–10.5)
nRBC: 0 % (ref 0.0–0.2)

## 2022-05-17 MED ORDER — MECLIZINE HCL 25 MG PO TABS: 25.0000 mg | ORAL_TABLET | Freq: Three times a day (TID) | ORAL | 0 refills | Status: AC | PRN

## 2022-05-17 NOTE — ED Notes (Signed)
Called patient's name (to do xray) in wtg room 2 separate times with no response. Checked in separate respiratory wtg area, no one there.

## 2022-05-17 NOTE — Discharge Instructions (Signed)
As we discussed your work-up today was reassuring.  You have no evidence of any new stroke, or damage to your heart.  Considering your symptoms improved with your blood pressure improving and suspicious that some of the symptoms may have to do with your elevated blood pressure, however with the hearing loss and dizziness specifically when you turn your head to the left I think that you may have an abnormality causing you to have some peripheral vertigo symptoms.  I have refilled your prescription for meclizine which is a medication that can help with dizziness while you are having the symptoms, in the meantime however I recommend that you follow-up with ENT to further discuss your dizziness and hearing loss.  Additionally I have placed a referral to cardiology, they should call you to schedule an appointment in the next few days to discuss your uncontrolled blood pressure and dizziness.  You may want to discuss changing your blood pressure medications with your primary care doctor if they remain poorly controlled.  In the meantime if you have new or worsening dizziness, double vision, numbness, tingling, face droop, chest pain, shortness of breath please return to the emergency department for further evaluation.

## 2022-05-17 NOTE — ED Triage Notes (Signed)
Patient here POV from Home.  Endorses Dizziness upon awakening this AM. BP was assessed at Home and Systolic was 478+. BP Medication was taken and with Patient went to Sleep and awoke with BP in 412+ Systolic.   Mild Nausea. Still Dizzy. No Emesis. No Diarrhea. No Fevers.   Recently changed Lisinopril to Losartan 2 Weeks ago.   NAD noted during Triage. A&Ox4. GCS 15. Ambulatory.

## 2022-05-17 NOTE — ED Provider Notes (Signed)
[facility] DEPT Provider Note   CSN: [id number] Arrival date & time: 05/17/22  1041     History  Chief Complaint  Patient presents with   Dizziness    Shawna Thompson is a 76 y.o. female with a past medical history significant for hypertension, positive ANA, obesity, acid reflux, hypercholesterolemia who presents with concern for high blood pressure, dizziness when waking this morning.  She reports that she had 357 systolic blood pressure prior to taking her medications.  She reports that she took her blood pressure medication and went to sleep for around an hour, woke up with blood pressure 017 systolic.  Patient reports that she recently changed her lisinopril to losartan around 2 weeks ago.  She reports that she had an episode of some mild nausea, and has had some ongoing dizziness.  She reports that the dizziness is significantly improved and that her blood pressure has gone down.  She does report that her dizziness seems worse when she turns her head to the left, which is the same side that she has slight loss of hearing on.  She has not seen ENT before, reports that she has not seen cardiology in many years, does not recall ever having a stress test, echo, or cath in the past.  At this time she denies any chest pain, shortness of breath, ongoing nausea.  She does endorse some mild blurriness of the vision earlier today but denies any double vision, reports that the blurry vision has resolved at this time.  She denies any body aches, diarrhea, fever, leg swelling, history of blood clot.   Dizziness      Home Medications Prior to Admission medications   Medication Sig Start Date End Date Taking? Authorizing Provider  meclizine (ANTIVERT) 25 MG tablet Take 1 tablet (25 mg total) by mouth 3 (three) times daily as needed for dizziness. 05/17/22  Yes Jyoti Harju H, PA-C  celecoxib (CELEBREX) 200 MG capsule TAKE 1 CAPSULE(200 MG) BY MOUTH TWICE DAILY  10/31/18   Leandrew Koyanagi, MD  cyclobenzaprine (FLEXERIL) 5 MG tablet Take 1-2 tablets (5-10 mg total) by mouth 3 (three) times daily as needed for muscle spasms. 03/14/19   Leandrew Koyanagi, MD  diclofenac sodium (VOLTAREN) 1 % GEL Apply 4 g topically 4 (four) times daily. 03/12/19   Petrucelli, Samantha R, PA-C  esomeprazole (NEXIUM) 20 MG capsule Take 20 mg by mouth 2 (two) times daily before a meal.    [provider]  famotidine (PEPCID) 20 MG tablet  07/19/20   [provider]  furosemide (LASIX) 20 MG tablet  04/01/20   [provider]  HYDROcodone-acetaminophen (NORCO) 5-325 MG tablet Take 1 tablet by mouth 3 (three) times daily as needed. To be taken as needed after surgery 03/16/21   Aundra Dubin, PA-C  lisinopril (PRINIVIL,ZESTRIL) 20 MG tablet Take 20 mg by mouth 2 (two) times daily. 02/14/18   [provider]  meloxicam (MOBIC) 7.5 MG tablet TAKE 1 TABLET BY MOUTH DAILY AS NEEDED FOR PAIN 09/29/18   Aundra Dubin, PA-C  ondansetron (ZOFRAN) 4 MG tablet Take 1 tablet (4 mg total) by mouth every 8 (eight) hours as needed for nausea or vomiting. 03/16/21   Aundra Dubin, PA-C  promethazine (PHENERGAN) 25 MG tablet Take 1 tablet (25 mg total) by mouth every 6 (six) hours as needed for nausea. 06/22/18   Leandrew Koyanagi, MD  senna-docusate (SENOKOT S) 8.6-50 MG tablet Take 1 tablet by  mouth at bedtime as needed. 06/22/18   Leandrew Koyanagi, MD  traMADol (ULTRAM) 50 MG tablet Take 1 tablet (50 mg total) by mouth 3 (three) times daily as needed. 04/21/22   Aundra Dubin, PA-C      Allergies    Patient has no known allergies.    Review of Systems   Review of Systems  Neurological:  Positive for dizziness.  All other systems reviewed and are negative.   Physical Exam Updated Vital Signs BP (!) 165/81   Pulse 76   Temp 98.2 F (36.8 C)   Resp 11   Ht '5\' 1"'$  (1.549 m)   Wt 90.7 kg   SpO2 98%   BMI 37.78 kg/m  Physical Exam Vitals and nursing note  reviewed.  Constitutional:      General: She is not in acute distress.    Appearance: Normal appearance.  HENT:     Head: Normocephalic and atraumatic.  Eyes:     General:        Right eye: No discharge.        Left eye: No discharge.  Cardiovascular:     Rate and Rhythm: Normal rate and regular rhythm.     Heart sounds: No murmur heard.    No friction rub. No gallop.     Comments: No significant tenderness palpation of the chest wall Pulmonary:     Effort: Pulmonary effort is normal.     Breath sounds: Normal breath sounds.     Comments: Normal breath sounds throughout, no wheezing, rhonchi, rales Abdominal:     General: Bowel sounds are normal.     Palpations: Abdomen is soft.  Skin:    General: Skin is warm and dry.     Capillary Refill: Capillary refill takes less than 2 seconds.  Neurological:     Mental Status: She is alert and oriented to person, place, and time.     Comments: Cranial nerves II through XII grossly intact.  Intact finger-nose, intact heel-to-shin.  Romberg negative, gait normal.  Alert and oriented x3.  Moves all 4 limbs spontaneously, normal coordination.  No pronator drift.  Intact strength 5 out of 5 bilateral upper and lower extremities.  No nystagmus but positive dizziness on Dix-Hallpike to the left.    Psychiatric:        Mood and Affect: Mood normal.        Behavior: Behavior normal.     ED Results / Procedures / Treatments   Labs (all labs ordered are listed, but only abnormal results are displayed) Labs Reviewed  BASIC METABOLIC PANEL - Abnormal; Notable for the following components:      Result Value   Glucose, Bld 104 (*)    All other components within normal limits  CBC - Abnormal; Notable for the following components:   RBC 5.15 (*)    MCV 78.4 (*)    MCH 24.9 (*)    All other components within normal limits  TROPONIN I (HIGH SENSITIVITY)  TROPONIN I (HIGH SENSITIVITY)    EKG EKG Interpretation  Date/Time:  Sunday May 17 2022 11:28:00 EDT Ventricular Rate:  83 PR Interval:  168 QRS Duration: 76 QT Interval:  360 QTC Calculation: 423 R Axis:   -5 Text Interpretation: Normal sinus rhythm Normal ECG When compared with ECG of 15-Mar-2020 21:19, PREVIOUS ECG IS PRESENT when compared to prior, similar appearance. No STEMI Confirmed by Antony Blackbird [phone]) on 05/17/2022 1:46:57 PM  Radiology DG Chest 2 View  Result Date: 05/17/2022 CLINICAL DATA:  Chest and jaw pain beginning this morning. Dizziness. Hypertension. Nausea. EXAM: CHEST - 2 VIEW COMPARISON:  03/23/2019 FINDINGS: The heart size and mediastinal contours are within normal limits. Both lungs are clear. Aortic atherosclerotic calcification incidentally noted. IMPRESSION: No active cardiopulmonary disease. Electronically Signed   By: Marlaine Hind M.D.   On: 05/17/2022 14:52   CT Head Wo Contrast  Result Date: 05/17/2022 CLINICAL DATA:  76 year old female with dizziness and nausea. EXAM: CT HEAD WITHOUT CONTRAST TECHNIQUE: Contiguous axial images were obtained from the base of the skull through the vertex without intravenous contrast. RADIATION DOSE REDUCTION: This exam was performed according to the departmental dose-optimization program which includes automated exposure control, adjustment of the mA and/or kV according to patient size and/or use of iterative reconstruction technique. COMPARISON:  10/07/2021 CT and prior studies FINDINGS: Brain: No evidence of acute infarction, hemorrhage, hydrocephalus, extra-axial collection or mass lesion/mass effect. Remote bilateral cerebellar infarcts and probable mild chronic small-vessel white matter ischemic changes again noted. Vascular: Carotid atherosclerotic calcifications are noted. Skull: Normal. Negative for fracture or focal lesion. Sinuses/Orbits: No acute finding. Other: None. IMPRESSION: 1. No evidence of acute intracranial abnormality. 2. Remote bilateral cerebellar infarcts and unchanged probable mild  chronic small-vessel white matter ischemic changes. Electronically Signed   By: Margarette Canada M.D.   On: 05/17/2022 14:52    Procedures Procedures    Medications Ordered in ED Medications - No data to display  ED Course/ Medical Decision Making/ A&P                           Medical Decision Making Amount and/or Complexity of Data Reviewed Labs: ordered. Radiology: ordered.  This patient is a 76 y.o. female who presents to the ED for concern of dizziness, elevated blood pressure, this involves an extensive number of treatment options, and is a complaint that carries with it a high risk of complications and morbidity. The emergent differential diagnosis prior to evaluation includes, but is not limited to, atypical ACS, pneumonia, acute bronchitis, neurogenic syncope, cardiogenic syncope, stroke, BPPV, Mnire's, or other ENT abnormality, symptomatic hypertensive urgency versus emergency, versus other.   This is not an exhaustive differential.   Past Medical History / Co-morbidities / Social History: hypertension, positive ANA, obesity, acid reflux, hypercholesterolemia  Additional history: Chart reviewed. Pertinent results include: Reviewed outpatient PCP visits, previous lab work and imaging from emergency department visits patient with no recent cardiology or ENT visits  Physical Exam: Physical exam performed. The pertinent findings include: No focal neurologic deficits, however she does have some dizziness with turning to the left side.  No positive Dix-Hallpike, she also endorses some decreased hearing on the side.  Normal heart rate and rhythm, no excess or lung sounds on my evaluation.  Lab Tests: I ordered, and personally interpreted labs.  The pertinent results include: Negative troponin x2.  BMP overall unremarkable, mild hyperglycemia glucose 104.  CBC overall unremarkable.   Imaging Studies: I ordered imaging studies including chest x-ray, CT head without contrast. I  independently visualized and interpreted imaging which showed remote infarcts that patient was already aware of noted on CT head, no other new intracranial abnormality, evidence of new stroke.  Plain film chest x-ray without intrathoracic abnormality. I agree with the radiologist interpretation.   Cardiac Monitoring:  The patient was maintained on a cardiac monitor.  My attending physician Dr. Sherry Ruffing viewed and interpreted the cardiac monitored which showed an  underlying rhythm of: NSR. I agree with this interpretation.  Disposition: After consideration of the diagnostic results and the patients response to treatment, I feel that patient's symptoms do not seem significantly consistent with neurogenic or cardiogenic syncope, however in context of her early control hypertension, in context of dizziness, as well as no recent cardiac evaluation and advanced age I think it be reasonable to refer patient to cardiologist for further evaluation of her dizziness to definitively rule out cardiogenic syncope.  In the meantime I think it is much more likely the patient has some BPPV versus Mnire's versus other peripheral vertigo, encourage follow-up with ENT and will provide patient with refill of her meclizine prescription in the meantime.  emergency department workup does not suggest an emergent condition requiring admission or immediate intervention beyond what has been performed at this time. The plan is: Discharge with referrals, strict return precautions as above. The patient is safe for discharge and has been instructed to return immediately for worsening symptoms, change in symptoms or any other concerns.  I discussed this case with my attending physician Dr. Sherry Ruffing who cosigned this note including patient's presenting symptoms, physical exam, and planned diagnostics and interventions. Attending physician stated agreement with plan or made changes to plan which were implemented.    Final Clinical  Impression(s) / ED Diagnoses Final diagnoses:  Elevated blood pressure reading  Dizziness    Rx / DC Orders ED Discharge Orders          Ordered    meclizine (ANTIVERT) 25 MG tablet  3 times daily PRN        05/17/22 1520    Ambulatory referral to Cardiology       Comments: Dizziness, uncontrolled hypertension   05/17/22 1521              Carlus Pavlov Crane H, PA-C 05/17/22 1528    Tegeler, Gwenyth Allegra, MD 05/18/22 [phone]

## 2022-05-20 ENCOUNTER — Ambulatory Visit (HOSPITAL_BASED_OUTPATIENT_CLINIC_OR_DEPARTMENT_OTHER): Payer: Medicare PPO | Admitting: Orthopaedic Surgery

## 2022-06-10 ENCOUNTER — Ambulatory Visit (INDEPENDENT_AMBULATORY_CARE_PROVIDER_SITE_OTHER): Payer: Medicare PPO | Admitting: Orthopaedic Surgery

## 2022-06-10 DIAGNOSIS — M1612 Unilateral primary osteoarthritis, left hip: Secondary | ICD-10-CM | POA: Diagnosis not present

## 2022-06-10 DIAGNOSIS — M25552 Pain in left hip: Secondary | ICD-10-CM

## 2022-06-10 DIAGNOSIS — M1611 Unilateral primary osteoarthritis, right hip: Secondary | ICD-10-CM | POA: Diagnosis not present

## 2022-06-10 DIAGNOSIS — M160 Bilateral primary osteoarthritis of hip: Secondary | ICD-10-CM

## 2022-06-10 DIAGNOSIS — M25551 Pain in right hip: Secondary | ICD-10-CM | POA: Diagnosis not present

## 2022-06-10 MED ORDER — LIDOCAINE HCL 1 % IJ SOLN
4.0000 mL | INTRAMUSCULAR | Status: AC | PRN
  Administered 2022-06-10: 4 mL

## 2022-06-10 MED ORDER — TRIAMCINOLONE ACETONIDE 40 MG/ML IJ SUSP
80.0000 mg | INTRAMUSCULAR | Status: AC | PRN
  Administered 2022-06-10: 80 mg via INTRA_ARTICULAR

## 2022-06-10 NOTE — Progress Notes (Signed)
Chief Complaint: Bilateral hip injection     History of Present Illness:    Shawna Thompson is a 76 y.o. female presents today as a referral from Dr. Erlinda Hong for injection of bilateral hips.  She has had pain in both of these for several months.  She is here today for injections.  She denies any history of diabetes.    Surgical History:   None  PMH/PSH/Family History/Social History/Meds/Allergies:    Past Medical History:  Diagnosis Date  . Arthritis   . GERD (gastroesophageal reflux disease)   . High cholesterol   . Hypertension   . MMT (medial meniscus tear)    right   Past Surgical History:  Procedure Laterality Date  . CHOLECYSTECTOMY    . KNEE ARTHROSCOPY WITH MEDIAL MENISECTOMY Right 06/22/2018   Procedure: RIGHT KNEE ARTHROSCOPY WITH MEDIAL MENISCECTOMY, CHONDROPLASTY;  Surgeon: Leandrew Koyanagi, MD;  Location: [facility];  Service: Orthopedics;  Laterality: Right;  . SHOULDER ARTHROSCOPY     removal of bone spur  . TONSILLECTOMY    . TUBAL LIGATION     Social History   Socioeconomic History  . Marital status: Widowed    Spouse name: Not on file  . Number of children: Not on file  . Years of education: Not on file  . Highest education level: Not on file  Occupational History  . Not on file  Tobacco Use  . Smoking status: Never  . Smokeless tobacco: Never  Vaping Use  . Vaping Use: Never used  Substance and Sexual Activity  . Alcohol use: Yes    Comment: social  . Drug use: Never  . Sexual activity: Not on file  Other Topics Concern  . Not on file  Social History Narrative  . Not on file   Social Determinants of Health   Financial Resource Strain: Not on file  Food Insecurity: Not on file  Transportation Needs: Not on file  Physical Activity: Not on file  Stress: Not on file  Social Connections: Not on file   Family History  Problem Relation Age of Onset  . Diabetes Mother   . Heart failure  Mother   . Heart failure Father    No Known Allergies Current Outpatient Medications  Medication Sig Dispense Refill  . celecoxib (CELEBREX) 200 MG capsule TAKE 1 CAPSULE(200 MG) BY MOUTH TWICE DAILY 180 capsule 3  . cyclobenzaprine (FLEXERIL) 5 MG tablet Take 1-2 tablets (5-10 mg total) by mouth 3 (three) times daily as needed for muscle spasms. 30 tablet 3  . diclofenac sodium (VOLTAREN) 1 % GEL Apply 4 g topically 4 (four) times daily. 100 g 0  . esomeprazole (NEXIUM) 20 MG capsule Take 20 mg by mouth 2 (two) times daily before a meal.    . famotidine (PEPCID) 20 MG tablet     . furosemide (LASIX) 20 MG tablet     . HYDROcodone-acetaminophen (NORCO) 5-325 MG tablet Take 1 tablet by mouth 3 (three) times daily as needed. To be taken as needed after surgery 20 tablet 0  . lisinopril (PRINIVIL,ZESTRIL) 20 MG tablet Take 20 mg by mouth 2 (two) times daily.  2  . meclizine (ANTIVERT) 25 MG tablet Take 1 tablet (25 mg total) by mouth 3 (three) times daily as needed for dizziness. 30 tablet 0  .  meloxicam (MOBIC) 7.5 MG tablet TAKE 1 TABLET BY MOUTH DAILY AS NEEDED FOR PAIN 90 tablet 1  . ondansetron (ZOFRAN) 4 MG tablet Take 1 tablet (4 mg total) by mouth every 8 (eight) hours as needed for nausea or vomiting. 40 tablet 0  . promethazine (PHENERGAN) 25 MG tablet Take 1 tablet (25 mg total) by mouth every 6 (six) hours as needed for nausea. 30 tablet 1  . senna-docusate (SENOKOT S) 8.6-50 MG tablet Take 1 tablet by mouth at bedtime as needed. 30 tablet 1  . traMADol (ULTRAM) 50 MG tablet Take 1 tablet (50 mg total) by mouth 3 (three) times daily as needed. 30 tablet 0   No current facility-administered medications for this visit.   No results found.  Review of Systems:   A ROS was performed including pertinent positives and negatives as documented in the HPI.  Physical Exam :   Constitutional: NAD and appears stated age Neurological: Alert and oriented Psych: Appropriate affect and  cooperative There were no vitals taken for this visit.   Comprehensive Musculoskeletal Exam:    Tenderness with 30 degrees of internal rotation of bilateral hips.  Remainder of distal neurosensory exam is intact  Imaging:    I personally reviewed and interpreted the radiographs.   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  I did discuss risk and benefits.  She like to proceed with this.  Bilateral ultrasound-guided injections performed after verbal consent obtained  Plan :    -Return to clinic as needed    Procedure Note  Patient: Shawna Thompson             Date of Birth: 01-29-1946           MRN: [id number]             Visit Date: 06/10/2022  Procedures: Visit Diagnoses: No diagnosis found.  Large Joint Inj: R hip joint on 06/10/2022 11:35 AM Indications: pain Details: 22 G 3.5 in needle, ultrasound-guided anterolateral approach  Arthrogram: No  Medications: 4 mL lidocaine 1 %; 80 mg triamcinolone acetonide 40 MG/ML Outcome: tolerated well, no immediate complications Procedure, treatment alternatives, risks and benefits explained, specific risks discussed. Consent was given by the patient. Immediately prior to procedure a time out was called to verify the correct patient, procedure, equipment, support staff and site/side marked as required. Patient was prepped and draped in the usual sterile fashion.    Large Joint Inj: L hip joint on 06/10/2022 11:35 AM Indications: pain Details: 22 G 3.5 in needle, ultrasound-guided anterolateral approach  Arthrogram: No  Medications: 4 mL lidocaine 1 %; 80 mg triamcinolone acetonide 40 MG/ML Outcome: tolerated well, no immediate complications Procedure, treatment alternatives, risks and benefits explained, specific risks discussed. Consent was given by the patient. Immediately prior to procedure a time out was called to verify the correct patient, procedure, equipment, support staff  and site/side marked as required. Patient was prepped and draped in the usual sterile fashion.          I personally saw and evaluated the patient, and participated in the management and treatment plan.  Vanetta Mulders, MD Attending Physician, Orthopedic Surgery  This document was dictated using Dragon voice recognition software. A reasonable attempt at proof reading has been made to minimize errors.

## 2022-06-29 ENCOUNTER — Encounter: Payer: Self-pay | Admitting: Gastroenterology

## 2022-07-03 ENCOUNTER — Ambulatory Visit: Payer: Self-pay | Admitting: *Deleted

## 2022-07-03 ENCOUNTER — Emergency Department (HOSPITAL_BASED_OUTPATIENT_CLINIC_OR_DEPARTMENT_OTHER): Payer: Medicare PPO

## 2022-07-03 ENCOUNTER — Encounter (HOSPITAL_BASED_OUTPATIENT_CLINIC_OR_DEPARTMENT_OTHER): Payer: Self-pay

## 2022-07-03 ENCOUNTER — Other Ambulatory Visit: Payer: Self-pay

## 2022-07-03 ENCOUNTER — Emergency Department (HOSPITAL_BASED_OUTPATIENT_CLINIC_OR_DEPARTMENT_OTHER)
Admission: EM | Admit: 2022-07-03 | Discharge: 2022-07-03 | Disposition: A | Discharge status: Home / Self Care | Payer: Medicare PPO | Attending: Emergency Medicine | Admitting: Emergency Medicine

## 2022-07-03 DIAGNOSIS — N950 Postmenopausal bleeding: Secondary | ICD-10-CM | POA: Diagnosis not present

## 2022-07-03 DIAGNOSIS — D259 Leiomyoma of uterus, unspecified: Secondary | ICD-10-CM | POA: Insufficient documentation

## 2022-07-03 DIAGNOSIS — D3500 Benign neoplasm of unspecified adrenal gland: Secondary | ICD-10-CM

## 2022-07-03 DIAGNOSIS — I700 Atherosclerosis of aorta: Secondary | ICD-10-CM | POA: Insufficient documentation

## 2022-07-03 DIAGNOSIS — I10 Essential (primary) hypertension: Secondary | ICD-10-CM | POA: Insufficient documentation

## 2022-07-03 DIAGNOSIS — K449 Diaphragmatic hernia without obstruction or gangrene: Secondary | ICD-10-CM | POA: Insufficient documentation

## 2022-07-03 DIAGNOSIS — N939 Abnormal uterine and vaginal bleeding, unspecified: Secondary | ICD-10-CM | POA: Diagnosis present

## 2022-07-03 DIAGNOSIS — Z780 Asymptomatic menopausal state: Secondary | ICD-10-CM | POA: Insufficient documentation

## 2022-07-03 LAB — CBC WITH DIFFERENTIAL/PLATELET
Abs Immature Granulocytes: 0.04 10*3/uL (ref 0.00–0.07)
Basophils Absolute: 0 10*3/uL (ref 0.0–0.1)
Basophils Relative: 1 %
Eosinophils Absolute: 0.4 10*3/uL (ref 0.0–0.5)
Eosinophils Relative: 4 %
HCT: 36.4 % (ref 36.0–46.0)
Hemoglobin: 11.5 g/dL — ABNORMAL LOW (ref 12.0–15.0)
Immature Granulocytes: 1 %
Lymphocytes Relative: 20 %
Lymphs Abs: 1.7 10*3/uL (ref 0.7–4.0)
MCH: 25.4 pg — ABNORMAL LOW (ref 26.0–34.0)
MCHC: 31.6 g/dL (ref 30.0–36.0)
MCV: 80.4 fL (ref 80.0–100.0)
Monocytes Absolute: 0.5 10*3/uL (ref 0.1–1.0)
Monocytes Relative: 5 %
Neutro Abs: 5.9 10*3/uL (ref 1.7–7.7)
Neutrophils Relative %: 69 %
Platelets: 221 10*3/uL (ref 150–400)
RBC: 4.53 MIL/uL (ref 3.87–5.11)
RDW: 16.4 % — ABNORMAL HIGH (ref 11.5–15.5)
WBC: 8.5 10*3/uL (ref 4.0–10.5)
nRBC: 0 % (ref 0.0–0.2)

## 2022-07-03 LAB — URINALYSIS, ROUTINE W REFLEX MICROSCOPIC
Bilirubin Urine: NEGATIVE
Glucose, UA: NEGATIVE mg/dL
Ketones, ur: NEGATIVE mg/dL
Nitrite: NEGATIVE
Protein, ur: NEGATIVE mg/dL
RBC / HPF: >50 RBC/hpf — ABNORMAL HIGH (ref 0–5)
Specific Gravity, Urine: 1.015 (ref 1.005–1.030)
pH: 5.5 (ref 5.0–8.0)

## 2022-07-03 LAB — COMPREHENSIVE METABOLIC PANEL
ALT: 10 U/L (ref 0–44)
AST: 12 U/L — ABNORMAL LOW (ref 15–41)
Albumin: 3.8 g/dL (ref 3.5–5.0)
Alkaline Phosphatase: 74 U/L (ref 38–126)
Anion gap: 12 (ref 5–15)
BUN: 21 mg/dL (ref 8–23)
CO2: 24 mmol/L (ref 22–32)
Calcium: 8.8 mg/dL — ABNORMAL LOW (ref 8.9–10.3)
Chloride: 108 mmol/L (ref 98–111)
Creatinine, Ser: 1.23 mg/dL — ABNORMAL HIGH (ref 0.44–1.00)
GFR, Estimated: 46 mL/min — ABNORMAL LOW (ref >60)
Glucose, Bld: 86 mg/dL (ref 70–99)
Potassium: 4 mmol/L (ref 3.5–5.1)
Sodium: 144 mmol/L (ref 135–145)
Total Bilirubin: 0.7 mg/dL (ref 0.3–1.2)
Total Protein: 6.4 g/dL — ABNORMAL LOW (ref 6.5–8.1)

## 2022-07-03 MED ORDER — LACTATED RINGERS IV BOLUS
500.0000 mL | Freq: Once | INTRAVENOUS | Status: AC
  Administered 2022-07-03: 500 mL via INTRAVENOUS

## 2022-07-03 MED ORDER — IOHEXOL 300 MG/ML  SOLN
100.0000 mL | Freq: Once | INTRAMUSCULAR | Status: AC | PRN
  Administered 2022-07-03: 80 mL via INTRAVENOUS

## 2022-07-03 MED ORDER — IOHEXOL 300 MG/ML  SOLN
100.0000 mL | Freq: Once | INTRAMUSCULAR | Status: DC | PRN
Start: 2022-07-03 — End: 2022-07-04

## 2022-07-03 NOTE — Telephone Encounter (Signed)
  Chief Complaint: sudden vaginal bleeding Symptoms: heavy bleeding Frequency: just started, getting dizzy Pertinent Negatives: Patient denies na Disposition: '[x]'$ ED /'[]'$ Urgent Care (no appt availability in office) / '[]'$ Appointment(In office/virtual)/ '[]'$  [facility]/ '[]'$ Home Care/ '[]'$ Refused Recommended Disposition /'[]'$ [facility]/ '[]'$  Follow-up with PCP Additional Notes: Pt to have someone else drive to ED due to getting dizzy, EMS offered and refused.   Reason for Disposition  SEVERE vaginal bleeding (e.g., soaking 2 pads or tampons per hour and present 2 or more hours; 1 menstrual cup every 2 hours)  Answer Assessment - Initial Assessment Questions 1. AMOUNT: "Describe the bleeding that you are having."    - SPOTTING: spotting, or pinkish / brownish mucous discharge; does not fill panty liner or pad    - MILD:  less than 1 pad / hour; less than patient's usual menstrual bleeding   - MODERATE: 1-2 pads / hour; 1 menstrual cup every 6 hours; small-medium blood clots (e.g., pea, grape, small coin)   - SEVERE: soaking 2 or more pads/hour for 2 or more hours; 1 menstrual cup every 2 hours; bleeding not contained by pads or continuous red blood from vagina; large blood clots (e.g., golf ball, large coin)      moderate 2. ONSET: "When did the bleeding begin?" "Is it continuing now?"     Just started 3. MENSTRUAL PERIOD: "When was the last normal menstrual period?" "How is this different than your period?"     Years ago 4. REGULARITY: "How regular are your periods?"     Do not have them. 5. ABDOMEN PAIN: "Do you have any pain?" "How bad is the pain?"  (e.g., Scale 1-10; mild, moderate, or severe)   - MILD (1-3): doesn't interfere with normal activities, abdomen soft and not tender to touch    - MODERATE (4-7): interferes with normal activities or awakens from sleep, abdomen tender to touch    - SEVERE (8-10): excruciating pain, doubled over, unable to do any normal activities       Pain started this am in her lower abd 6. PREGNANCY: "Is there any chance you are pregnant?" "When was your last menstrual period?"     no  11. HEMODYNAMIC STATUS: "Are you weak or feeling lightheaded?" If Yes, ask: "Can you stand and walk normally?"        Getting "weak in head" 12. OTHER SYMPTOMS: "What other symptoms are you having with the bleeding?" (e.g., passed tissue, vaginal discharge, fever, menstrual-type cramps)       no  Protocols used: Vaginal Bleeding - Abnormal-A-AH

## 2022-07-03 NOTE — Discharge Instructions (Signed)
You were seen in the emergency department for evaluation of your vaginal bleeding.  This is abnormal you will need to follow-up with a gynecologist.  I have placed an ambulatory referral to gynecology and have also listed the information for a gynecologist in the area.  Please call to schedule an appointment.  It is extremely important for you to follow-up with this.  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  Would like for you to follow-up with your primary care doctor about this.  If you are saturating more than 1 pad an hour, feel lightheaded, fever, worsening abdominal pain, please return to the nearest emergency department for reevaluation.  Otherwise, if you have any concern, new or worsening symptoms, please turn to the nearest emergency department for evaluation.  Contact a doctor if: You have new bleeding from the vagina after menopause. You have pain in your belly (abdomen). Get help right away if: You have a fever or chills. You have very bad pain with bleeding. You have clumps of blood (blood clots) coming from your vagina. You have a lot of bleeding, and: You use more than 1 pad an hour. This kind of bleeding has never happened before. You have headaches. You feel dizzy or you feel like you are going to pass out (faint).

## 2022-07-03 NOTE — ED Provider Notes (Signed)
[facility] DEPT Provider Note   CSN: [id number] Arrival date & time: 07/03/22  0848     History {Add pertinent medical, surgical, social history, OB history to HPI:1} Chief Complaint  Patient presents with   Vaginal Bleeding    Hilma Eriona Kinchen is a 76 y.o. female.   Vaginal Bleeding      Home Medications Prior to Admission medications   Medication Sig Start Date End Date Taking? Authorizing Provider  celecoxib (CELEBREX) 200 MG capsule TAKE 1 CAPSULE(200 MG) BY MOUTH TWICE DAILY 10/31/18   Leandrew Koyanagi, MD  cyclobenzaprine (FLEXERIL) 5 MG tablet Take 1-2 tablets (5-10 mg total) by mouth 3 (three) times daily as needed for muscle spasms. 03/14/19   Leandrew Koyanagi, MD  diclofenac sodium (VOLTAREN) 1 % GEL Apply 4 g topically 4 (four) times daily. 03/12/19   Petrucelli, Samantha R, PA-C  esomeprazole (NEXIUM) 20 MG capsule Take 20 mg by mouth 2 (two) times daily before a meal.    [provider]  famotidine (PEPCID) 20 MG tablet  07/19/20   [provider]  furosemide (LASIX) 20 MG tablet  04/01/20   [provider]  HYDROcodone-acetaminophen (NORCO) 5-325 MG tablet Take 1 tablet by mouth 3 (three) times daily as needed. To be taken as needed after surgery 03/16/21   Aundra Dubin, PA-C  lisinopril (PRINIVIL,ZESTRIL) 20 MG tablet Take 20 mg by mouth 2 (two) times daily. 02/14/18   [provider]  meclizine (ANTIVERT) 25 MG tablet Take 1 tablet (25 mg total) by mouth 3 (three) times daily as needed for dizziness. 05/17/22   Prosperi, Christian H, PA-C  meloxicam (MOBIC) 7.5 MG tablet TAKE 1 TABLET BY MOUTH DAILY AS NEEDED FOR PAIN 09/29/18   Aundra Dubin, PA-C  ondansetron (ZOFRAN) 4 MG tablet Take 1 tablet (4 mg total) by mouth every 8 (eight) hours as needed for nausea or vomiting. 03/16/21   Aundra Dubin, PA-C  promethazine (PHENERGAN) 25 MG tablet Take 1 tablet (25 mg total) by mouth every 6 (six) hours as needed  for nausea. 06/22/18   Leandrew Koyanagi, MD  senna-docusate (SENOKOT S) 8.6-50 MG tablet Take 1 tablet by mouth at bedtime as needed. 06/22/18   Leandrew Koyanagi, MD  traMADol (ULTRAM) 50 MG tablet Take 1 tablet (50 mg total) by mouth 3 (three) times daily as needed. 04/21/22   Aundra Dubin, PA-C      Allergies    Patient has no known allergies.    Review of Systems   Review of Systems  Genitourinary:  Positive for vaginal bleeding.    Physical Exam Updated Vital Signs BP (!) 154/88   Pulse 84   Temp 98 F (36.7 C) (Oral)   Resp 16   SpO2 99%  Physical Exam Vitals and nursing note reviewed. Exam conducted with a chaperone present (Mikayla, Tech).     ED Results / Procedures / Treatments   Labs (all labs ordered are listed, but only abnormal results are displayed) Labs Reviewed  CBC WITH DIFFERENTIAL/PLATELET - Abnormal; Notable for the following components:      Result Value   Hemoglobin 11.5 (*)    MCH 25.4 (*)    RDW 16.4 (*)    All other components within normal limits  COMPREHENSIVE METABOLIC PANEL - Abnormal; Notable for the following components:   Creatinine, Ser 1.23 (*)    Calcium 8.8 (*)    Total Protein 6.4 (*)    AST  12 (*)    GFR, Estimated 46 (*)    All other components within normal limits  URINALYSIS, ROUTINE W REFLEX MICROSCOPIC - Abnormal; Notable for the following components:   Color, Urine ORANGE (*)    APPearance HAZY (*)    Hgb urine dipstick LARGE (*)    Leukocytes,Ua SMALL (*)    RBC / HPF >50 (*)    All other components within normal limits    EKG None  Radiology US PELVIC COMPLETE WITH TRANSVAGINAL  Result Date: 07/03/2022 CLINICAL DATA:  Postmenopausal bleeding EXAM: TRANSABDOMINAL AND TRANSVAGINAL ULTRASOUND OF PELVIS TECHNIQUE: Both transabdominal and transvaginal ultrasound examinations of the pelvis were performed. Transabdominal technique was performed for global imaging of the pelvis including uterus, ovaries, adnexal regions, and  pelvic cul-de-sac. It was necessary to proceed with endovaginal exam following the transabdominal exam to visualize the uterus, endometrium, and adnexa. COMPARISON:  CT abdomen and pelvis 07/03/2022 ultrasound 08/17/2016 FINDINGS: Uterus Measurements: 7.2 x 3.9 x 4.2 cm = volume: 61.4 mL. Partially exophytic mass along the posterior left uterine body measuring 4.7 x 3.6 x 4.3 cm most likely representing a fibroid. Endometrium Thickness: 5 mm.  No focal abnormality visualized. Right ovary Not visualized Left ovary Not visualized Other findings No abnormal free fluid. IMPRESSION: Endometrial thickness of 5 mm. No abnormal mass or vascularity. This is at the upper limits of normal. In the setting of postmenopausal bleeding gynecology consult is recommended. 4.7 cm partially exophytic mass along the posterior left uterine body favored to represent a fibroid. Electronically Signed   By: Placido Sou M.D.   On: 07/03/2022 19:21   CT ABDOMEN PELVIS W CONTRAST  Result Date: 07/03/2022 CLINICAL DATA:  acute abdominal pain in a 76 year old female. Also with vaginal bleeding and lower abdominal cramping. EXAM: CT ABDOMEN AND PELVIS WITH CONTRAST TECHNIQUE: Multidetector CT imaging of the abdomen and pelvis was performed using the standard protocol following bolus administration of intravenous contrast. RADIATION DOSE REDUCTION: This exam was performed according to the departmental dose-optimization program which includes automated exposure control, adjustment of the mA and/or kV according to patient size and/or use of iterative reconstruction technique. CONTRAST:  65m OMNIPAQUE IOHEXOL 300 MG/ML COMPARISON:  March 15, 2020 FINDINGS: Lower chest: Mild basilar atelectasis. No effusion. No consolidative changes. Cardiac structures are unremarkable to the extent evaluated. No chest wall abnormality. Hepatobiliary: Post cholecystectomy without biliary duct distension. No focal, suspicious hepatic lesion. Subcentimeter  hypodense areas in the RIGHT hepatic lobe likely small cysts or hemangiomata. Portal vein is patent. Pancreas: Mild atrophy of the pancreas without ductal dilation, inflammation or visible lesion. Atrophy concentrated more in the pancreatic head. Spleen: Normal. Adrenals/Urinary Tract: 2.4 cm LEFT adrenal lesion with density of 34 Hounsfield units, homogeneous appearance and well-circumscribed margins are unchanged from previous imaging dating back to 2021. RIGHT adrenal gland is normal. Renal enhancement is symmetric without hydronephrosis or perinephric stranding. No suspicious renal lesion. Small cyst along the lateral cortex of the RIGHT kidney measures a cm and is 6 Hounsfield units density. No dedicated imaging follow-up is recommended for this finding. No perivesical stranding. Stomach/Bowel: Small hiatal hernia. No acute gastrointestinal findings. Normal appendix. Vascular/Lymphatic: Aortic atherosclerosis. No sign of aneurysm. Smooth contour of the IVC. There is no gastrohepatic or hepatoduodenal ligament lymphadenopathy. No retroperitoneal or mesenteric lymphadenopathy. No pelvic sidewall lymphadenopathy. Reproductive: Heterogeneity of the uterus. Leiomyoma along the posterior margin of the uterine fundus. Other: No ascites or abdominal wall hernia. Musculoskeletal: No acute bone finding. No destructive bone process.  Spinal degenerative changes. IMPRESSION: 1. No acute findings in the abdomen or pelvis. 2. Small hiatal hernia. 3. 2.4 cm LEFT adrenal lesion with density of 34 Hounsfield units, homogeneous appearance and well-circumscribed margins are unchanged from previous imaging dating back to 2021. This likely represents an adrenal adenoma. Incompletely characterized on current imaging. Stability is reassuring but would suggest nonemergent follow-up adrenal protocol CT and would also consider the following. Based on current clinical literature, biochemical evaluation to exclude possible functioning  adrenal nodule is suggested if not already performed. Please refer to current clinical guidelines for detailed recommendations. NEJM 5093:267 154-51 4. Heterogeneity of the uterus. Leiomyoma along the posterior margin of the uterine fundus. In a postmenopausal patient with reported history of vaginal bleeding gyn follow-up and pelvic sonogram may be warranted. 5. Aortic atherosclerosis. Electronically Signed   By: Zetta Bills M.D.   On: 07/03/2022 16:39    Procedures Procedures  {Document cardiac monitor, telemetry assessment procedure when appropriate:1}  Medications Ordered in ED Medications  iohexol (OMNIPAQUE) 300 MG/ML solution 100 mL ( Intravenous Canceled Entry 07/03/22 1609)  lactated ringers bolus 500 mL (0 mLs Intravenous Stopped 07/03/22 1538)  lactated ringers bolus 500 mL (0 mLs Intravenous Stopped 07/03/22 1726)  iohexol (OMNIPAQUE) 300 MG/ML solution 100 mL (80 mLs Intravenous Contrast Given 07/03/22 1606)    ED Course/ Medical Decision Making/ A&P                           Medical Decision Making Amount and/or Complexity of Data Reviewed Labs: ordered. Radiology: ordered.  Risk Prescription drug management.   ***  Spoke with Dr. Elgie Congo who recommended [place] given the patient's age and postmenopausal bleeding. He reports to refer her to the [facility] for prompt scheduling.    I discussed the lab and imaging results with the patient. I stressed the importance of following up with gynecology for further follow up and testing. We discussed strict return precautions like saturating a pad every hour for 2 hours, lightheadedness, increase pain, fever, etc. To return to the ER. She verbalized understanding and agrees to the plan. The patient is stable and being discharged home in good condition.   I discussed this case with my attending physician who cosigned this note including patient's presenting symptoms, physical exam, and planned diagnostics and interventions.  Attending physician stated agreement with plan or made changes to plan which were implemented.   Final Clinical Impression(s) / ED Diagnoses Final diagnoses:  Adrenal adenoma, unspecified laterality  Post-menopausal bleeding    Rx / DC Orders ED Discharge Orders     None

## 2022-07-03 NOTE — ED Triage Notes (Signed)
Pt presents POV from home  Pt reports lower abd cramping and vaginal "hemorrhaging" starting this am

## 2022-07-03 NOTE — ED Notes (Signed)
This RN attempted IV access x 2; unsuccessful

## 2022-07-03 NOTE — ED Notes (Signed)
Report given to [facility] for continuation of care.

## 2022-07-03 NOTE — ED Notes (Signed)
Patient transported to CT 

## 2022-07-20 ENCOUNTER — Encounter (HOSPITAL_BASED_OUTPATIENT_CLINIC_OR_DEPARTMENT_OTHER): Payer: Self-pay

## 2022-07-20 ENCOUNTER — Emergency Department (HOSPITAL_BASED_OUTPATIENT_CLINIC_OR_DEPARTMENT_OTHER): Payer: Medicare PPO | Admitting: Radiology

## 2022-07-20 ENCOUNTER — Emergency Department (HOSPITAL_BASED_OUTPATIENT_CLINIC_OR_DEPARTMENT_OTHER): Payer: Medicare PPO

## 2022-07-20 ENCOUNTER — Other Ambulatory Visit: Payer: Self-pay

## 2022-07-20 ENCOUNTER — Emergency Department (HOSPITAL_BASED_OUTPATIENT_CLINIC_OR_DEPARTMENT_OTHER)
Admission: EM | Admit: 2022-07-20 | Discharge: 2022-07-20 | Disposition: A | Discharge status: Home / Self Care | Payer: Medicare PPO | Attending: Emergency Medicine | Admitting: Emergency Medicine

## 2022-07-20 DIAGNOSIS — Z79899 Other long term (current) drug therapy: Secondary | ICD-10-CM | POA: Diagnosis not present

## 2022-07-20 DIAGNOSIS — W109XXA Fall (on) (from) unspecified stairs and steps, initial encounter: Secondary | ICD-10-CM | POA: Diagnosis not present

## 2022-07-20 DIAGNOSIS — S4991XA Unspecified injury of right shoulder and upper arm, initial encounter: Secondary | ICD-10-CM | POA: Diagnosis not present

## 2022-07-20 MED ORDER — HYDROCODONE-ACETAMINOPHEN 5-325 MG PO TABS: 1.0000 | ORAL_TABLET | ORAL | 0 refills | Status: DC | PRN

## 2022-07-20 MED ORDER — HYDROCODONE-ACETAMINOPHEN 5-325 MG PO TABS
2.0000 | ORAL_TABLET | Freq: Once | ORAL | Status: AC
  Administered 2022-07-20: 2 via ORAL
  Filled 2022-07-20: qty 2

## 2022-07-20 NOTE — ED Notes (Signed)
Discharge paperwork given and verbally understood. Pt understood no driving bc of the medication given.

## 2022-07-20 NOTE — ED Provider Notes (Signed)
[facility] EMERGENC[facility] DEPT Provider Note   CSN: [id number] Arrival date & time: 07/20/22  1654     History  Chief Complaint  Patient presents with   [facility] Shawna Thompson is a 76 y.o. female.  Pt slipped and fell hitting her right shoulder.  Pt complains of shoulder pain   The history is provided by the patient. No language interpreter was used.  Fall This is a new problem. The current episode started 3 to 5 hours ago. The problem occurs constantly. The problem has been gradually worsening. Pertinent negatives include no chest pain and no abdominal pain. Nothing aggravates the symptoms. Nothing relieves the symptoms. She has tried nothing for the symptoms. The treatment provided no relief.       Home Medications Prior to Admission medications   Medication Sig Start Date End Date Taking? Authorizing Provider  HYDROcodone-acetaminophen (NORCO/VICODIN) 5-325 MG tablet Take 1 tablet by mouth every 4 (four) hours as needed for moderate pain. 07/20/22 07/20/23 Yes Fransico Meadow, PA-C  celecoxib (CELEBREX) 200 MG capsule TAKE 1 CAPSULE(200 MG) BY MOUTH TWICE DAILY 10/31/18   Leandrew Koyanagi, MD  cyclobenzaprine (FLEXERIL) 5 MG tablet Take 1-2 tablets (5-10 mg total) by mouth 3 (three) times daily as needed for muscle spasms. 03/14/19   Leandrew Koyanagi, MD  diclofenac sodium (VOLTAREN) 1 % GEL Apply 4 g topically 4 (four) times daily. 03/12/19   Petrucelli, Samantha R, PA-C  esomeprazole (NEXIUM) 20 MG capsule Take 20 mg by mouth 2 (two) times daily before a meal.    [provider]  famotidine (PEPCID) 20 MG tablet  07/19/20   [provider]  furosemide (LASIX) 20 MG tablet  04/01/20   [provider]  lisinopril (PRINIVIL,ZESTRIL) 20 MG tablet Take 20 mg by mouth 2 (two) times daily. 02/14/18   [provider]  meclizine (ANTIVERT) 25 MG tablet Take 1 tablet (25 mg total) by mouth 3 (three) times daily as needed for dizziness. 05/17/22    Prosperi, Christian H, PA-C  meloxicam (MOBIC) 7.5 MG tablet TAKE 1 TABLET BY MOUTH DAILY AS NEEDED FOR PAIN 09/29/18   Aundra Dubin, PA-C  ondansetron (ZOFRAN) 4 MG tablet Take 1 tablet (4 mg total) by mouth every 8 (eight) hours as needed for nausea or vomiting. 03/16/21   Aundra Dubin, PA-C  promethazine (PHENERGAN) 25 MG tablet Take 1 tablet (25 mg total) by mouth every 6 (six) hours as needed for nausea. 06/22/18   Leandrew Koyanagi, MD  senna-docusate (SENOKOT S) 8.6-50 MG tablet Take 1 tablet by mouth at bedtime as needed. 06/22/18   Leandrew Koyanagi, MD  traMADol (ULTRAM) 50 MG tablet Take 1 tablet (50 mg total) by mouth 3 (three) times daily as needed. 04/21/22   Aundra Dubin, PA-C      Allergies    Patient has no known allergies.    Review of Systems   Review of Systems  Cardiovascular:  Negative for chest pain.  Gastrointestinal:  Negative for abdominal pain.  All other systems reviewed and are negative.   Physical Exam Updated Vital Signs BP (!) 145/79 (BP Location: Left Arm)   Pulse 93   Temp 98.4 F (36.9 C) (Oral)   Resp 16   Ht '5\' 1"'$  (1.549 m)   Wt 90.7 kg   SpO2 99%   BMI 37.78 kg/m  Physical Exam Vitals and nursing note reviewed.  Constitutional:      Appearance: She  is well-developed.  HENT:     Head: Normocephalic and atraumatic.  Cardiovascular:     Rate and Rhythm: Normal rate.  Pulmonary:     Effort: Pulmonary effort is normal.  Abdominal:     General: There is no distension.  Musculoskeletal:        General: Swelling and tenderness present.     Cervical back: Normal range of motion.     Comments: Swollen tender right shoulder,  pain with movement,  nv and ns intact  Skin:    General: Skin is warm.  Neurological:     Mental Status: She is alert and oriented to person, place, and time.     ED Results / Procedures / Treatments   Labs (all labs ordered are listed, but only abnormal results are displayed) Labs Reviewed - No data to  display  EKG None  Radiology CT Shoulder Right Wo Contrast  Result Date: 07/20/2022 CLINICAL DATA:  Fall. Concern for greater tuberosity fracture on x-ray EXAM: CT OF THE UPPER RIGHT EXTREMITY WITHOUT CONTRAST TECHNIQUE: Multidetector CT imaging of the upper right extremity was performed according to the standard protocol. RADIATION DOSE REDUCTION: This exam was performed according to the departmental dose-optimization program which includes automated exposure control, adjustment of the mA and/or kV according to patient size and/or use of iterative reconstruction technique. COMPARISON:  Same-day x-ray FINDINGS: Bones/Joint/Cartilage No acute fracture. No dislocation. Mild glenohumeral joint osteoarthritis manifested by joint space narrowing and tiny marginal osteophyte formation. A small glenohumeral joint effusion is evident. Subcortical cystic changes at the greater and lesser tuberosities likely related to chronic underlying rotator cuff tendinopathy. Moderate-severe degenerative changes of the acromioclavicular joint. No large subacromial-subdeltoid bursal fluid collection. Remaining visualized osseous structures appear grossly intact. Ligaments Suboptimally assessed by CT. Muscles and Tendons Preserved muscle bulk of the rotator cuff musculature without significant atrophy or fatty infiltration. Rotator cuff tendons appear grossly intact within the limitations of CT. Soft tissues No soft tissue fluid collection or hematoma. No axillary lymphadenopathy. Visualized lung field is clear. IMPRESSION: 1. No acute fracture or dislocation of the right shoulder. 2. Mild glenohumeral joint osteoarthritis with a small glenohumeral joint effusion. 3. Moderate-severe degenerative changes of the acromioclavicular joint. 4. Subcortical cystic changes at the greater and lesser tuberosities likely related to chronic underlying rotator cuff tendinopathy. Electronically Signed   By: Davina Poke D.O.   On: 07/20/2022  21:30   DG Shoulder Right  Result Date: 07/20/2022 CLINICAL DATA:  Fall EXAM: RIGHT SHOULDER - 2+ VIEW COMPARISON:  None Available. FINDINGS: Moderate AC joint degenerative change and mild glenohumeral degenerative change. No malalignment. Question nondisplaced fracture at the greater tuberosity. Radiopaque PACS is clear IMPRESSION: Findings questionable for nondisplaced fracture at the greater tuberosity, suggest CT for further assessment. Electronically Signed   By: Donavan Foil M.D.   On: 07/20/2022 18:06    Procedures Procedures    Medications Ordered in ED Medications  HYDROcodone-acetaminophen (NORCO/VICODIN) 5-325 MG per tablet 2 tablet (2 tablets Oral Given 07/20/22 2124)    ED Course/ Medical Decision Making/ A&P                           Medical Decision Making Pt fell and injured her right shoulder   Amount and/or Complexity of Data Reviewed Radiology: ordered and independent interpretation performed. Decision-making details documented in ED Course.    Details: Xray  right shoulder,  radiologist reports possible fracture and advised ct of shoulder.  Ct shows no fracture   Risk Prescription drug management. Risk Details: Pt advised to follow up with Dr. Erlinda Hong  her Orthopaedist  for recheck.  Pt given rx for hydrocodone            Final Clinical Impression(s) / ED Diagnoses Final diagnoses:  Shoulder injury, right, initial encounter    Rx / DC Orders ED Discharge Orders          Ordered    HYDROcodone-acetaminophen (NORCO/VICODIN) 5-325 MG tablet  Every 4 hours PRN        07/20/22 2205          An After Visit Summary was printed and given to the patient.     Fransico Meadow, PA-C 02/71/42 3200    Lianne Cure, DO 94/17/91 1831

## 2022-07-20 NOTE — Discharge Instructions (Addendum)
Return if any problems.

## 2022-07-20 NOTE — ED Triage Notes (Signed)
Patient here POV from Home.  Endorses Falling 2 Hours ago outside when she missed a Step and fell onto [employer]. Fell onto her Right Shoulder.  No LOC. No Head Injury. No Anticoagulants.   NAD Noted during Triage. A&Ox4. GCS 15. Ambulatory.

## 2022-07-28 ENCOUNTER — Ambulatory Visit: Payer: Medicare PPO | Admitting: Orthopaedic Surgery

## 2022-08-07 ENCOUNTER — Ambulatory Visit (HOSPITAL_BASED_OUTPATIENT_CLINIC_OR_DEPARTMENT_OTHER): Payer: Medicare PPO | Admitting: Internal Medicine

## 2022-08-14 ENCOUNTER — Encounter: Payer: Self-pay | Admitting: Orthopaedic Surgery

## 2022-08-14 ENCOUNTER — Ambulatory Visit: Payer: Medicare PPO | Admitting: Orthopaedic Surgery

## 2022-08-14 DIAGNOSIS — M25511 Pain in right shoulder: Secondary | ICD-10-CM

## 2022-08-14 DIAGNOSIS — G8929 Other chronic pain: Secondary | ICD-10-CM | POA: Diagnosis not present

## 2022-08-14 MED ORDER — METHYLPREDNISOLONE ACETATE 40 MG/ML IJ SUSP
40.0000 mg | INTRAMUSCULAR | Status: AC | PRN
  Administered 2022-08-14: 40 mg via INTRA_ARTICULAR

## 2022-08-14 MED ORDER — BUPIVACAINE HCL 0.5 % IJ SOLN
3.0000 mL | INTRAMUSCULAR | Status: AC | PRN
  Administered 2022-08-14: 3 mL via INTRA_ARTICULAR

## 2022-08-14 MED ORDER — LIDOCAINE HCL 1 % IJ SOLN
3.0000 mL | INTRAMUSCULAR | Status: AC | PRN
  Administered 2022-08-14: 3 mL

## 2022-08-14 NOTE — Progress Notes (Signed)
Office Visit Note   Patient: Shawna Thompson           Date of Birth: 07-Aug-1946           MRN: [id number] Visit Date: 08/14/2022              Requested by: Nolene Ebbs, MD [id number] [place],  [place] [address] PCP: Nolene Ebbs, MD   Assessment & Plan: Visit Diagnoses:  1. Chronic right shoulder pain     Plan: Impression is chronic right shoulder pain following an injury about 1 month ago.  Believe majority of her symptoms are coming from the rotator cuff tendinitis and subacromial bursitis and I have proposed injecting this with cortisone today.  She is agreeable to this plan.  I will also provide her with a gym exercise program as I have discussed the need to continue using her shoulder to prevent adhesive capsulitis.  She will follow-up with [place] as needed.  Follow-Up Instructions: Return if symptoms worsen or fail to improve.   Orders:  No orders of the defined types were placed in this encounter.  No orders of the defined types were placed in this encounter.     Procedures: Large Joint Inj: R subacromial bursa on 08/14/2022 4:23 PM Indications: pain Details: 22 G needle  Arthrogram: No  Medications: 3 mL lidocaine 1 %; 3 mL bupivacaine 0.5 %; 40 mg methylPREDNISolone acetate 40 MG/ML Outcome: tolerated well, no immediate complications Consent was given by the patient. Patient was prepped and draped in the usual sterile fashion.       Clinical Data: No additional findings.   Subjective: Chief Complaint  Patient presents with   Right Shoulder - Pain    DOI 07/20/2022    HPI patient is a pleasant 76 year old female who comes in today for follow-up of her right shoulder.  She sustained a mechanical fall on 07/20/2022 landing on her right shoulder.  She was seen in the ED where x-rays and subsequent CT scan were obtained.  X-rays were negative for fracture but did show moderate arthritis to the [facility] joint mild arthritis and effusion to the  glenohumeral joint.  She tells me today that her symptoms have improved quite a bit.  She still has some pain and discomfort to the deltoid.  This is intermittent but worse with activity.  She is taking Norco 1-2 times daily as needed.  No previous cortisone injection to the right shoulder.  Review of Systems as detailed in HPI.  All others reviewed and are negative.   Objective: Vital Signs: There were no vitals taken for this visit.  Physical Exam well-developed well-nourished female no acute distress.  Alert and oriented x3.  Ortho Exam right shoulder exam shows active forward flexion to approximately 90 degrees.  I can passively get her to about 160 degrees.  Pain and 4 out of 5 strength with resisted empty can test.  She does have 4 out of 5 strength with resisted external rotation as well.  Negative belly press, bearhug, speeds and O'Brien's test.  She is neurovascular intact distally.   Specialty Comments:  No specialty comments available.  Imaging: No new imaging   PMFS History: Patient Active Problem List   Diagnosis Date Noted   Trigger finger, right middle finger 04/17/2021   Primary osteoarthritis of right knee 12/11/2019   Primary osteoarthritis of left knee 12/11/2019   Bilateral primary osteoarthritis of knee 02/28/2019   Trochanteric bursitis of left hip 10/06/2018   Morbid  obesity ([place]) 08/05/2018   Body mass index 40.0-44.9, adult ([place]) 08/05/2018   Chondromalacia, right knee    Positive ANA (antinuclear antibody) 06/14/2018   Chronic pain of right knee 06/14/2018   Acute medial meniscus tear, right, initial encounter 06/14/2018   Past Medical History:  Diagnosis Date   Arthritis    Chronic fatigue syndrome    Constipation    Depression    Dysphagia    GERD (gastroesophageal reflux disease)    Glaucoma    Dr Katy Fitch   High cholesterol    Hypertension    IDA (iron deficiency anemia)    MMT (medial meniscus tear)    right   Obesity    Vertigo      Family History  Problem Relation Age of Onset   Diabetes Mother    Heart failure Mother    Heart failure Father     Past Surgical History:  Procedure Laterality Date   CHOLECYSTECTOMY     COLONOSCOPY  09/19/2013   Dr St[facility]  The entire examined colon is normal   KNEE ARTHROSCOPY WITH MEDIAL MENISECTOMY Right 06/22/2018   Procedure: RIGHT KNEE ARTHROSCOPY WITH MEDIAL MENISCECTOMY, CHONDROPLASTY;  Surgeon: Leandrew Koyanagi, MD;  Location: [facility];  Service: Orthopedics;  Laterality: Right;   SHOULDER ARTHROSCOPY     removal of bone spur   TONSILLECTOMY     TUBAL LIGATION     Social History   Occupational History   Not on file  Tobacco Use   Smoking status: Never   Smokeless tobacco: Never  Vaping Use   Vaping Use: Never used  Substance and Sexual Activity   Alcohol use: Yes    Comment: social   Drug use: Never   Sexual activity: Not on file

## 2022-08-31 ENCOUNTER — Ambulatory Visit (INDEPENDENT_AMBULATORY_CARE_PROVIDER_SITE_OTHER): Payer: Medicare PPO | Admitting: Gastroenterology

## 2022-08-31 ENCOUNTER — Encounter: Payer: Self-pay | Admitting: Gastroenterology

## 2022-08-31 VITALS — BP 122/70 | HR 60 | Ht 61.0 in | Wt 200.0 lb

## 2022-08-31 DIAGNOSIS — R1310 Dysphagia, unspecified: Secondary | ICD-10-CM | POA: Diagnosis not present

## 2022-08-31 DIAGNOSIS — K5909 Other constipation: Secondary | ICD-10-CM

## 2022-08-31 DIAGNOSIS — K219 Gastro-esophageal reflux disease without esophagitis: Secondary | ICD-10-CM | POA: Diagnosis not present

## 2022-08-31 DIAGNOSIS — D509 Iron deficiency anemia, unspecified: Secondary | ICD-10-CM | POA: Diagnosis not present

## 2022-08-31 MED ORDER — PANTOPRAZOLE SODIUM 40 MG PO TBEC: 40.0000 mg | DELAYED_RELEASE_TABLET | Freq: Every day | ORAL | 4 refills | Status: DC

## 2022-08-31 MED ORDER — CLENPIQ 10-3.5-12 MG-GM -GM/175ML PO SOLN: 1.0000 | ORAL | 0 refills | Status: DC

## 2022-08-31 NOTE — Progress Notes (Signed)
Chief Complaint: For GI evaluation  Referring Provider:  Nolene Ebbs, MD      ASSESSMENT AND PLAN;   #1. GERD with occ dysphagia  #2. Chronic constipation. Neg CT AP 06/2022. Due for rpt colon.  #3. IDA, responded well to iron, likely gyn causes. She is scheduled for D&C with Dr Matthew Saras.   Plan: -EGD with dil/colon -CBC, CMP at the time. -Continue protonix '40mg'$  po QD #90, 4RF -Continue Pepcid as needed at night. -Continue miralax prn   I discussed EGD/Colonoscopy- the indications, risks, alternatives and potential complications including, but not limited to bleeding, infection, reaction to meds, damage to internal organs, cardiac and/or pulmonary problems, and perforation requiring surgery. The possibility that significant findings could be missed was explained. All ? were answered. Pt consents to proceed. HPI:    Shawna Thompson is a 76 y.o. female  With HTN, OA, chronic fatigue syndrome, HLD, IDA, obesity, anxiety/depression  With longstanding history of GERD.  She has been having daily heartburn with occasional dysphagia mostly to bread.  Started on Protonix 2 weeks ago with significant improvement.  Has history of esophageal dilatation in 2014 by Dr. Amedeo Plenty.  Was also told that she has a small hiatal hernia.  She denies having any nausea or vomiting.  Also had lower abdominal pain with longstanding history of constipation-better now with MiraLAX/Metamucil/Cinnamon.  Currently having 2 BMs per day.  Her bloating is much better.  No melena or hematochezia. Neg CT AP with contrast 06/2022  No sodas, chocolates, chewing gums, artificial sweeteners and candy. No NSAIDs.  H/O IDA but had postmenopausal vaginal bleeding, appt with Dr Matthew Saras, [facility] scheduled feb 2024. US pelvis apparently showed small uterine polyps/fibroids.  No melena or hematochezia.     Latest Ref Rng & Units 07/03/2022   10:07 AM 05/17/2022   11:43 AM 12/19/2021   12:00 AM  CBC  WBC 4.0 - 10.5 K/uL  8.5  8.0  8.9   Hemoglobin 12.0 - 15.0 g/dL 11.5  12.8  12.4   Hematocrit 36.0 - 46.0 % 36.4  40.4  38.5   Platelets 150 - 400 K/uL 221  328  296       Previous GI work-up: CT AP with contrast 06/2022 1. No acute findings in the abdomen or pelvis. 2. Small hiatal hernia. 3. 2.4 cm LEFT adrenal lesion with density of 34 Hounsfield units, homogeneous appearance and well-circumscribed margins are unchanged from previous imaging dating back to 2021. 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 Due for rpt. 2014-EGD with dil Dr Sabino Dick works as a Presenter, broadcasting, Academic librarian auction on [employer] Past Medical History:  Diagnosis Date   Arthritis    Chronic fatigue syndrome    Constipation    Depression    Dysphagia    GERD (gastroesophageal reflux disease)    Glaucoma    Dr Katy Fitch   High cholesterol    Hypertension    IDA (iron deficiency anemia)    MMT (medial meniscus tear)    right   Obesity    Vertigo     Past Surgical History:  Procedure Laterality Date   CHOLECYSTECTOMY     COLONOSCOPY  09/19/2013   Dr St[facility]  The entire examined colon is normal   KNEE ARTHROSCOPY WITH MEDIAL MENISECTOMY Right 06/22/2018   Procedure: RIGHT KNEE ARTHROSCOPY WITH MEDIAL MENISCECTOMY, CHONDROPLASTY;  Surgeon: Leandrew Koyanagi, MD;  Location: [facility];  Service: Orthopedics;  Laterality: Right;   SHOULDER ARTHROSCOPY     removal of bone spur   TONSILLECTOMY     TUBAL LIGATION      Family History  Problem Relation Age of Onset   Diabetes Mother    Heart failure Mother    Heart failure Father    Colon cancer Neg Hx    Rectal cancer Neg Hx    Stomach cancer Neg Hx    Esophageal cancer Neg Hx    Liver cancer Neg Hx    Pancreatic cancer Neg Hx     Social History   Tobacco Use   Smoking status: Never   Smokeless tobacco: Never  Vaping Use   Vaping Use: Never used  Substance Use Topics   Alcohol use: Yes    Comment: rare    Drug use: Never    Current Outpatient Medications  Medication Sig Dispense Refill   atorvastatin (LIPITOR) 40 MG tablet Take 40 mg by mouth daily.     famotidine (PEPCID) 20 MG tablet      FEROSUL 325 (65 Fe) MG tablet Take 325 mg by mouth daily.     hydrochlorothiazide (HYDRODIURIL) 25 MG tablet Take 25 mg by mouth daily.     losartan (COZAAR) 100 MG tablet Take 1 tablet by mouth daily.     meclizine (ANTIVERT) 25 MG tablet Take 1 tablet (25 mg total) by mouth 3 (three) times daily as needed for dizziness. 30 tablet 0   oxybutynin (DITROPAN) 5 MG tablet Take 5 mg by mouth at bedtime.     pantoprazole (PROTONIX) 40 MG tablet Take 1 tablet by mouth every morning.     No current facility-administered medications for this visit.    No Known Allergies  Review of Systems:  Constitutional: Denies fever, chills, diaphoresis, appetite change and has fatigue.  HEENT: Denies photophobia, eye pain, redness, hearing loss, ear pain, congestion, sore throat, rhinorrhea, sneezing, mouth sores, neck pain, neck stiffness and tinnitus.   Respiratory: Denies SOB, DOE, cough, chest tightness,  and wheezing.   Cardiovascular: Denies chest pain, palpitations and leg swelling.  Genitourinary: Denies dysuria, urgency, frequency, hematuria, flank pain and difficulty urinating.  Musculoskeletal: Has myalgias, back pain, joint swelling, arthralgias and gait problem.  Skin: No rash.  Neurological: Denies dizziness, seizures, syncope, weakness, light-headedness, numbness and headaches.  Hematological: Denies adenopathy. Easy bruising, personal or family bleeding history  Psychiatric/Behavioral: Has anxiety or depression     Physical Exam:    Today's Vitals   08/31/22 1038  BP: 122/70  Pulse: 60  Weight: 200 lb (90.7 kg)  Height: '5\' 1"'$  (1.549 m)   Body mass index is 37.79 kg/m.  Constitutional:  Well-developed, in no acute distress. Psychiatric: Normal mood and affect. Behavior is normal. HEENT:  Pupils normal.  Conjunctivae are normal. No scleral icterus. Cardiovascular: Normal rate, regular rhythm. No edema Pulmonary/chest: Effort normal and breath sounds normal. No wheezing, rales or rhonchi. Abdominal: Soft, nondistended. Nontender. Bowel sounds active throughout. There are no masses palpable. No hepatomegaly. Rectal: Deferred.  To be performed at the time of colonoscopy. Neurological: Alert and oriented to person place and time. Skin: Skin is warm and dry. No rashes noted.  Data Reviewed: I have personally reviewed following labs and imaging studies  CBC:    Latest Ref Rng & Units 07/03/2022   10:07 AM 05/17/2022   11:43 AM 12/19/2021   12:00 AM  CBC  WBC 4.0 - 10.5 K/uL 8.5  8.0  8.9   Hemoglobin 12.0 -  15.0 g/dL 11.5  12.8  12.4   Hematocrit 36.0 - 46.0 % 36.4  40.4  38.5   Platelets 150 - 400 K/uL 221  328  296     CMP:    Latest Ref Rng & Units 07/03/2022   10:07 AM 05/17/2022   11:43 AM 12/19/2021   12:00 AM  CMP  Glucose 70 - 99 mg/dL 86  104  81   BUN 8 - 23 mg/dL '21  11  17   '$ Creatinine 0.44 - 1.00 mg/dL 1.23  0.89  0.90   Sodium 135 - 145 mmol/L 144  140  141   Potassium 3.5 - 5.1 mmol/L 4.0  3.9  4.2   Chloride 98 - 111 mmol/L 108  106  104   CO2 22 - 32 mmol/L '24  24  27   '$ Calcium 8.9 - 10.3 mg/dL 8.8  9.2  9.2   Total Protein 6.5 - 8.1 g/dL 6.4   6.7   Total Bilirubin 0.3 - 1.2 mg/dL 0.7   0.5   Alkaline Phos 38 - 126 U/L 74     AST 15 - 41 U/L 12   16   ALT 0 - 44 U/L 10   12    CT reviewed.    Carmell Austria, MD 08/31/2022, 10:42 AM  Cc: Nolene Ebbs, MD

## 2022-08-31 NOTE — Patient Instructions (Addendum)
_______________________________________________________  If you are age 74 or older, your body mass index should be between 23-30. Your Body mass index is 37.79 kg/m. If this is out of the aforementioned range listed, please consider follow up with your Primary Care Provider.  If you are age 44 or younger, your body mass index should be between 19-25. Your Body mass index is 37.79 kg/m. If this is out of the aformentioned range listed, please consider follow up with your Primary Care Provider.   ________________________________________________________  The [facility] [facility] providers would like to encourage you to use [facility] to communicate with providers for non-urgent requests or questions.  Due to long hold times on the telephone, sending your provider a message by [facility] may be a faster and more efficient way to get a response.  Please allow 48 business hours for a response.  Please remember that this is for non-urgent requests.  _______________________________________________________  Your provider has requested that you go to the basement level for lab work before your procedure. Press "B" on the elevator. Come around 2pm.  We have sent the following medications to your pharmacy for you to pick up at your convenience: Clenpiq Protonix  You have been scheduled for an endoscopy and colonoscopy. Please follow the written instructions given to you at your visit today. Please pick up your prep supplies at the pharmacy within the next 1-3 days. If you use inhalers (even only as needed), please bring them with you on the day of your procedure.   Continue Protonix daily and Miralax as needed  Hold Iron 1 week prior to the procedure.    Thank you,  Dr. Jackquline Denmark

## 2022-09-30 ENCOUNTER — Encounter: Payer: Self-pay | Admitting: Gastroenterology

## 2022-10-08 ENCOUNTER — Encounter: Payer: Medicare PPO | Admitting: Gastroenterology

## 2022-10-08 ENCOUNTER — Telehealth: Payer: Self-pay | Admitting: Gastroenterology

## 2022-10-08 NOTE — Telephone Encounter (Signed)
PT is scheduled for egd/colonoscopy today and she has flu like symptoms and cancelling procedure

## 2022-10-09 ENCOUNTER — Other Ambulatory Visit: Payer: Self-pay

## 2022-10-09 DIAGNOSIS — K5909 Other constipation: Secondary | ICD-10-CM

## 2022-10-09 DIAGNOSIS — R1310 Dysphagia, unspecified: Secondary | ICD-10-CM

## 2022-10-09 DIAGNOSIS — K219 Gastro-esophageal reflux disease without esophagitis: Secondary | ICD-10-CM

## 2022-10-09 DIAGNOSIS — D509 Iron deficiency anemia, unspecified: Secondary | ICD-10-CM

## 2022-10-09 NOTE — Telephone Encounter (Signed)
Pt made aware of Dr. Lyndel Safe recommendations: Pt was rescheduled for 12/02/2022 at 1:30 PM with Dr. Lyndel Safe for the EGD/Colon: Pt stated that her prep was to expensive and requested something different.  Prep instructions was sent to pt via my chart and via mail with new prep: Pt made aware:  Ambulatory referral to GI placed: Pt verbalized understanding with all questions answered.

## 2022-10-09 NOTE — Telephone Encounter (Signed)
Thanks for letting me know. RG  Remo Lipps, Can you please call pt and reschedule RG

## 2022-10-27 NOTE — H&P (Signed)
Shawna Thompson, WENG MEDICAL RECORD NO: [id number] ACCOUNT NO: [id number] DATE OF BIRTH: 1946-08-31 PHYSICIAN: Ralene Bathe. Matthew Saras, MD  History and Physical   DATE OF ADMISSION: 11/09/2022  Upcoming surgery at [facility] on 02/05.  CHIEF COMPLAINT:  Postmenopausal bleeding.  HISTORY OF PRESENT ILLNESS:  A 77 year old G2 P2, postmenopausal patient who was evaluated recently in our office with sonohysterogram for postmenopausal bleeding 10/23.  Findings showed a 4.3 x 2.7 cm posterior left subserosal fibroid, endometrium 5.3  on saline infusion what appeared to be a 7 mm polyp was noted.  She presents at this time for D and C, hysteroscopy with possible MyoSure.  This procedure including risk of bleeding, infection, other complications such as perforation may require  additional surgery discussed with her, which she understands and accepts.  PAST MEDICAL HISTORY:   ALLERGIES:  None.  CURRENT MEDICATIONS:  Atorvastatin 40 mg daily, cetirizine 10 mg at bedtime p.r.n., Pepcid 20 mg at bedtime, hydrochlorothiazide 25 mg daily, losartan 100 mg daily, meclizine 25 mg p.r.n., meloxicam 7.5 one p.o. daily p.r.n., oxybutynin 5 mg one p.o. at  bedtime,  OB HISTORY: She is a G2, P2.  PAST SURGICAL HISTORY:  Cholecystectomy, tubal tonsillectomy, meniscus repair, release of the trigger finger.  SOCIAL HISTORY:  She is widowed, former smoker.  Denies alcohol use.  PHYSICAL EXAMINATION:   HEENT:  Unremarkable. NECK:  Supple, without masses.  Thyroid nonpalpable. HEART AND LUNGS:  Clear. BREASTS: Negative. ABDOMEN:  Soft, flat, nontender. PELVIC: Normal external genitalia, vagina, and cervix clear.  Uterus mid position, upper limit of normal size.  Adnexa negative.  ASSESSMENT:  Postmenopausal bleeding, probable endometrial polyp, fibroid is noted.  PLAN:  D and C, hysteroscopy with MyoSure.  Procedure and risks discussed as above.       PAA D: 10/26/2022 3:27:11 pm T: 10/26/2022  10:03:00 pm  JOB: [id number]/ [id number]

## 2022-11-03 ENCOUNTER — Encounter (HOSPITAL_BASED_OUTPATIENT_CLINIC_OR_DEPARTMENT_OTHER): Payer: Self-pay | Admitting: Obstetrics and Gynecology

## 2022-11-03 NOTE — Progress Notes (Signed)
Spoke w/ via phone for pre-op interview--- Shawna Thompson----  T&S and CBC per surgeon and ISTAT per anesthesia Lab results------EKG Current in Epic dated 05/2022 COVID test -----patient states asymptomatic no test needed Arrive at -------0530 NPO after MN NO Solid Food.   Med rec completed Medications to take morning of surgery -----NONE Diabetic medication ----- Patient instructed no nail polish to be worn day of surgery Patient instructed to bring photo id and insurance card day of surgery Patient aware to have Driver (ride ) / caregiver  Daughter Shawna Thompson  for 24 hours after surgery  Patient Special Instructions ----- Pre-Op special Istructions ----- Patient verbalized understanding of instructions that were given at this phone interview. Patient denies shortness of breath, chest pain, fever, cough at this phone interview.

## 2022-11-09 ENCOUNTER — Ambulatory Visit (HOSPITAL_BASED_OUTPATIENT_CLINIC_OR_DEPARTMENT_OTHER): Payer: Medicare PPO | Admitting: Anesthesiology

## 2022-11-09 ENCOUNTER — Other Ambulatory Visit: Payer: Self-pay

## 2022-11-09 ENCOUNTER — Ambulatory Visit (HOSPITAL_BASED_OUTPATIENT_CLINIC_OR_DEPARTMENT_OTHER)
Admission: RE | Admit: 2022-11-09 | Discharge: 2022-11-09 | Disposition: A | Discharge status: Home / Self Care | Payer: Medicare PPO | Source: Ambulatory Visit | Attending: Obstetrics and Gynecology | Admitting: Obstetrics and Gynecology

## 2022-11-09 ENCOUNTER — Encounter (HOSPITAL_BASED_OUTPATIENT_CLINIC_OR_DEPARTMENT_OTHER): Payer: Self-pay | Admitting: Obstetrics and Gynecology

## 2022-11-09 ENCOUNTER — Encounter (HOSPITAL_BASED_OUTPATIENT_CLINIC_OR_DEPARTMENT_OTHER)
Admission: RE | Disposition: A | Discharge status: Home / Self Care | Payer: Self-pay | Source: Ambulatory Visit | Attending: Obstetrics and Gynecology

## 2022-11-09 DIAGNOSIS — Z79899 Other long term (current) drug therapy: Secondary | ICD-10-CM | POA: Insufficient documentation

## 2022-11-09 DIAGNOSIS — N840 Polyp of corpus uteri: Secondary | ICD-10-CM | POA: Diagnosis not present

## 2022-11-09 DIAGNOSIS — M1990 Unspecified osteoarthritis, unspecified site: Secondary | ICD-10-CM | POA: Insufficient documentation

## 2022-11-09 DIAGNOSIS — K219 Gastro-esophageal reflux disease without esophagitis: Secondary | ICD-10-CM | POA: Diagnosis not present

## 2022-11-09 DIAGNOSIS — I10 Essential (primary) hypertension: Secondary | ICD-10-CM | POA: Insufficient documentation

## 2022-11-09 DIAGNOSIS — N950 Postmenopausal bleeding: Secondary | ICD-10-CM | POA: Diagnosis present

## 2022-11-09 HISTORY — PX: DILATATION & CURETTAGE/HYSTEROSCOPY WITH MYOSURE: SHX6511

## 2022-11-09 LAB — POCT I-STAT, CHEM 8
BUN: 13 mg/dL (ref 8–23)
Calcium, Ion: 1.12 mmol/L — ABNORMAL LOW (ref 1.15–1.40)
Chloride: 107 mmol/L (ref 98–111)
Creatinine, Ser: 1.1 mg/dL — ABNORMAL HIGH (ref 0.44–1.00)
Glucose, Bld: 98 mg/dL (ref 70–99)
HCT: 36 % (ref 36.0–46.0)
Hemoglobin: 12.2 g/dL (ref 12.0–15.0)
Potassium: 3.5 mmol/L (ref 3.5–5.1)
Sodium: 143 mmol/L (ref 135–145)
TCO2: 24 mmol/L (ref 22–32)

## 2022-11-09 LAB — CBC
HCT: 36.8 % (ref 36.0–46.0)
Hemoglobin: 11.6 g/dL — ABNORMAL LOW (ref 12.0–15.0)
MCH: 25.7 pg — ABNORMAL LOW (ref 26.0–34.0)
MCHC: 31.5 g/dL (ref 30.0–36.0)
MCV: 81.6 fL (ref 80.0–100.0)
Platelets: 262 10*3/uL (ref 150–400)
RBC: 4.51 MIL/uL (ref 3.87–5.11)
RDW: 13.6 % (ref 11.5–15.5)
WBC: 6.4 10*3/uL (ref 4.0–10.5)
nRBC: 0 % (ref 0.0–0.2)

## 2022-11-09 LAB — TYPE AND SCREEN
ABO/RH(D): O POS
Antibody Screen: NEGATIVE

## 2022-11-09 LAB — ABO/RH: ABO/RH(D): O POS

## 2022-11-09 SURGERY — DILATATION & CURETTAGE/HYSTEROSCOPY WITH MYOSURE
Anesthesia: General | Site: Uterus

## 2022-11-09 MED ORDER — FENTANYL CITRATE (PF) 100 MCG/2ML IJ SOLN
INTRAMUSCULAR | Status: DC | PRN
  Administered 2022-11-09: 75 ug via INTRAVENOUS

## 2022-11-09 MED ORDER — PROPOFOL 10 MG/ML IV BOLUS
INTRAVENOUS | Status: AC
  Filled 2022-11-09: qty 20

## 2022-11-09 MED ORDER — ACETAMINOPHEN 500 MG PO TABS
1000.0000 mg | ORAL_TABLET | Freq: Once | ORAL | Status: AC
  Administered 2022-11-09: 1000 mg via ORAL

## 2022-11-09 MED ORDER — SODIUM CHLORIDE 0.9 % IR SOLN
Status: DC | PRN
  Administered 2022-11-09: 3000 mL

## 2022-11-09 MED ORDER — HYDRALAZINE HCL 20 MG/ML IJ SOLN
10.0000 mg | INTRAMUSCULAR | Status: DC | PRN
  Administered 2022-11-09: 10 mg via INTRAVENOUS

## 2022-11-09 MED ORDER — SODIUM CHLORIDE 0.9 % IV SOLN
INTRAVENOUS | Status: AC
  Filled 2022-11-09: qty 2

## 2022-11-09 MED ORDER — KETOROLAC TROMETHAMINE 30 MG/ML IJ SOLN
INTRAMUSCULAR | Status: DC | PRN
  Administered 2022-11-09: 30 mg via INTRAVENOUS

## 2022-11-09 MED ORDER — HYDRALAZINE HCL 20 MG/ML IJ SOLN
INTRAMUSCULAR | Status: AC
  Filled 2022-11-09: qty 1

## 2022-11-09 MED ORDER — KETOROLAC TROMETHAMINE 30 MG/ML IJ SOLN
INTRAMUSCULAR | Status: AC
  Filled 2022-11-09: qty 1

## 2022-11-09 MED ORDER — DEXAMETHASONE SODIUM PHOSPHATE 10 MG/ML IJ SOLN
INTRAMUSCULAR | Status: AC
  Filled 2022-11-09: qty 1

## 2022-11-09 MED ORDER — LIDOCAINE 2% (20 MG/ML) 5 ML SYRINGE
INTRAMUSCULAR | Status: DC | PRN
  Administered 2022-11-09: 60 mg via INTRAVENOUS

## 2022-11-09 MED ORDER — LIDOCAINE HCL (PF) 2 % IJ SOLN
INTRAMUSCULAR | Status: AC
  Filled 2022-11-09: qty 5

## 2022-11-09 MED ORDER — POVIDONE-IODINE 10 % EX SWAB: 2.0000 | Freq: Once | CUTANEOUS | Status: DC

## 2022-11-09 MED ORDER — SODIUM CHLORIDE 0.9 % IV SOLN
2.0000 g | INTRAVENOUS | Status: AC
  Administered 2022-11-09: 2 g via INTRAVENOUS

## 2022-11-09 MED ORDER — ONDANSETRON HCL 4 MG/2ML IJ SOLN
INTRAMUSCULAR | Status: DC | PRN
  Administered 2022-11-09: 4 mg via INTRAVENOUS

## 2022-11-09 MED ORDER — LACTATED RINGERS IV SOLN: INTRAVENOUS | Status: DC

## 2022-11-09 MED ORDER — PROPOFOL 10 MG/ML IV BOLUS
INTRAVENOUS | Status: DC | PRN
  Administered 2022-11-09: 200 mg via INTRAVENOUS

## 2022-11-09 MED ORDER — DEXAMETHASONE SODIUM PHOSPHATE 10 MG/ML IJ SOLN
INTRAMUSCULAR | Status: DC | PRN
  Administered 2022-11-09: 5 mg via INTRAVENOUS

## 2022-11-09 MED ORDER — FENTANYL CITRATE (PF) 100 MCG/2ML IJ SOLN
INTRAMUSCULAR | Status: AC
  Filled 2022-11-09: qty 2

## 2022-11-09 MED ORDER — ONDANSETRON HCL 4 MG/2ML IJ SOLN
INTRAMUSCULAR | Status: AC
  Filled 2022-11-09: qty 2

## 2022-11-09 MED ORDER — LIDOCAINE HCL 1 % IJ SOLN
INTRAMUSCULAR | Status: DC | PRN
  Administered 2022-11-09: 7 mL

## 2022-11-09 MED ORDER — FENTANYL CITRATE (PF) 100 MCG/2ML IJ SOLN: 25.0000 ug | INTRAMUSCULAR | Status: DC | PRN

## 2022-11-09 MED ORDER — AMISULPRIDE (ANTIEMETIC) 5 MG/2ML IV SOLN: 10.0000 mg | Freq: Once | INTRAVENOUS | Status: DC | PRN

## 2022-11-09 MED ORDER — ACETAMINOPHEN 500 MG PO TABS
ORAL_TABLET | ORAL | Status: AC
  Filled 2022-11-09: qty 2

## 2022-11-09 SURGICAL SUPPLY — 12 items
DEVICE MYOSURE LITE (MISCELLANEOUS) IMPLANT
GLOVE BIO SURGEON STRL SZ7 (GLOVE) ×1 IMPLANT
GLOVE BIOGEL PI IND STRL 7.0 (GLOVE) ×1 IMPLANT
GLOVE SURG SS PI 7.0 STRL IVOR (GLOVE) IMPLANT
GOWN STRL REUS W/TWL LRG LVL3 (GOWN DISPOSABLE) ×2 IMPLANT
IV NS IRRIG 3000ML ARTHROMATIC (IV SOLUTION) ×2 IMPLANT
KIT PROCEDURE FLUENT (KITS) ×1 IMPLANT
KIT TURNOVER CYSTO (KITS) ×1 IMPLANT
PACK VAGINAL MINOR WOMEN LF (CUSTOM PROCEDURE TRAY) ×1 IMPLANT
PAD OB MATERNITY 4.3X12.25 (PERSONAL CARE ITEMS) ×1 IMPLANT
PAD PREP 24X48 CUFFED NSTRL (MISCELLANEOUS) ×1 IMPLANT
SEAL ROD LENS SCOPE MYOSURE (ABLATOR) ×1 IMPLANT

## 2022-11-09 NOTE — Progress Notes (Signed)
The patient was re-examined with no change in status 

## 2022-11-09 NOTE — Anesthesia Procedure Notes (Signed)
Procedure Name: LMA Insertion Date/Time: 11/09/2022 7:34 AM  Performed by: Suan Halter, CRNAPre-anesthesia Checklist: Patient identified, Emergency Drugs available, Suction available and Patient being monitored Patient Re-evaluated:Patient Re-evaluated prior to induction Oxygen Delivery Method: Circle system utilized Preoxygenation: Pre-oxygenation with 100% oxygen Induction Type: IV induction Ventilation: Mask ventilation without difficulty LMA: LMA inserted LMA Size: 4.0 Number of attempts: 1 Airway Equipment and Method: Bite block Placement Confirmation: positive ETCO2 Tube secured with: Tape Dental Injury: Teeth and Oropharynx as per pre-operative assessment

## 2022-11-09 NOTE — Discharge Instructions (Addendum)
  Post Anesthesia Home Care Instructions  Activity: Get plenty of rest for the remainder of the day. A responsible individual must stay with you for 24 hours following the procedure.  For the next 24 hours, DO NOT: -Drive a car -Paediatric nurse -Drink alcoholic beverages -Take any medication unless instructed by your physician -Make any legal decisions or sign important papers.  Meals: Start with liquid foods such as gelatin or soup. Progress to regular foods as tolerated. Avoid greasy, spicy, heavy foods. If nausea and/or vomiting occur, drink only clear liquids until the nausea and/or vomiting subsides. Call your physician if vomiting continues.  Special Instructions/Symptoms: Your throat may feel dry or sore from the anesthesia or the breathing tube placed in your throat during surgery. If this causes discomfort, gargle with warm salt water. The discomfort should disappear within 24 hours.  No acetaminophen/Tylenol until after 12:45 pm today if needed. No ibuprofen, Advil, Aleve, Motrin, ketorolac, meloxicam, naproxen, or other NSAIDS until after 4 pm today if needed.

## 2022-11-09 NOTE — Anesthesia Preprocedure Evaluation (Addendum)
Anesthesia Evaluation  Patient identified by MRN, date of birth, ID band Patient awake    Reviewed: Allergy & Precautions, NPO status , Patient's Chart, lab work & pertinent test results  Airway Mallampati: II  TM Distance: >3 FB Neck ROM: Full    Dental  (+) Dental Advisory Given   Pulmonary neg pulmonary ROS   breath sounds clear to auscultation       Cardiovascular hypertension, Pt. on medications  Rhythm:Regular Rate:Normal     Neuro/Psych negative neurological ROS     GI/Hepatic Neg liver ROS,GERD  ,,  Endo/Other  negative endocrine ROS    Renal/GU negative Renal ROS     Musculoskeletal  (+) Arthritis ,    Abdominal   Peds  Hematology  (+) Blood dyscrasia, anemia   Anesthesia Other Findings   Reproductive/Obstetrics                             Anesthesia Physical Anesthesia Plan  ASA: 2  Anesthesia Plan: General   Post-op Pain Management: Tylenol PO (pre-op)* and Toradol IV (intra-op)*   Induction: Intravenous  PONV Risk Score and Plan: 3 and Dexamethasone, Ondansetron and Treatment may vary due to age or medical condition  Airway Management Planned: LMA  Additional Equipment:   Intra-op Plan:   Post-operative Plan: Extubation in OR  Informed Consent: I have reviewed the patients History and Physical, chart, labs and discussed the procedure including the risks, benefits and alternatives for the proposed anesthesia with the patient or authorized representative who has indicated his/her understanding and acceptance.     Dental advisory given  Plan Discussed with: CRNA  Anesthesia Plan Comments:        Anesthesia Quick Evaluation

## 2022-11-09 NOTE — Transfer of Care (Signed)
Immediate Anesthesia Transfer of Care Note  Patient: Shawna Thompson  Procedure(s) Performed: Procedure(s) (LRB): DILATATION & CURETTAGE/HYSTEROSCOPY WITH MYOSURE (N/A)  Patient Location: PACU  Anesthesia Type: General  Level of Consciousness: awake, oriented, sedated and patient cooperative  Airway & Oxygen Therapy: Patient Spontanous Breathing and Patient connected to face mask oxygen  Post-op Assessment: Report given to PACU RN and Post -op Vital signs reviewed and stable  Post vital signs: Reviewed and stable  Complications: No apparent anesthesia complications Last Vitals:  Vitals Value Taken Time  BP    Temp    Pulse 65 11/09/22 0804  Resp 12 11/09/22 0804  SpO2 95 % 11/09/22 0804  Vitals shown include unvalidated device data.  Last Pain:  Vitals:   11/09/22 0640  TempSrc: Oral  PainSc: 0-No pain      Patients Stated Pain Goal: 5 (20/94/70 9628)  Complications: No notable events documented.

## 2022-11-09 NOTE — Op Note (Signed)
Pre-op diagnosis: Postmenopausal bleeding, endometrial polyp  Postoperative diagnosis: Same  Procedure: D&C hysteroscopy with MyoSure resection of endometrial polyp  Surgeon::  Anesthesia: General  EBL: Less than 10 cc  Procedure findings patient was taken to the operating room general anesthesia was obtained appropriate timeout was taken and she was prepped and draped for vaginal procedures.  Speculum was positioned cervix grasped with a tenaculum paracervical block was then created by infiltrating 3 9:00 submucosally total of 7 cc 1% Xylocaine at 3 and 9:00 after negative aspiration.  Uterus sounded to 8 8 to 9 cm progressively dilated to 19-21 Pratt, continuous-flow hysteroscope was inserted and excellent view of the upper fundus looked unremarkable there was a solitary benign-appearing polyp on the anterior lower third of the fundus.  Scope was removed sharp curettage was carried out 1 or 2 passes with the polyp forceps this was submitted to pathology the scope was reinserted the flimsy looking polyp however remained, the MyoSure was inserted and this was resected specifically.  The cavity looked clean at that point.  Tissue submitted to pathology  She tolerated this well went to recovery room in good condition.  Dictated with Dragon medical  Margarette Asal MD

## 2022-11-09 NOTE — Anesthesia Postprocedure Evaluation (Signed)
Anesthesia Post Note  Patient: Stepahnie Campo Neuzil  Procedure(s) Performed: [place] (Uterus)     Patient location during evaluation: PACU Anesthesia Type: General Level of consciousness: awake and alert Pain management: pain level controlled Vital Signs Assessment: post-procedure vital signs reviewed and stable Respiratory status: spontaneous breathing, nonlabored ventilation, respiratory function stable and patient connected to nasal cannula oxygen Cardiovascular status: blood pressure returned to baseline and stable Postop Assessment: no apparent nausea or vomiting Anesthetic complications: no  No notable events documented.  Last Vitals:  Vitals:   11/09/22 0915 11/09/22 0945  BP: (!) 171/80 (!) 171/74  Pulse: 62 79  Resp: 12 16  Temp:  36.8 C  SpO2: 96% 97%    Last Pain:  Vitals:   11/09/22 0945  TempSrc:   PainSc: 0-No pain                 Tiajuana Amass

## 2022-11-10 ENCOUNTER — Encounter (HOSPITAL_BASED_OUTPATIENT_CLINIC_OR_DEPARTMENT_OTHER): Payer: Self-pay | Admitting: Obstetrics and Gynecology

## 2022-11-11 LAB — SURGICAL PATHOLOGY

## 2022-12-02 ENCOUNTER — Encounter: Payer: Self-pay | Admitting: Gastroenterology

## 2022-12-02 ENCOUNTER — Ambulatory Visit (AMBULATORY_SURGERY_CENTER): Payer: Medicare PPO | Admitting: Gastroenterology

## 2022-12-02 VITALS — BP 173/89 | HR 94 | Temp 96.6°F | Resp 15 | Ht 61.0 in | Wt 200.0 lb

## 2022-12-02 DIAGNOSIS — R1310 Dysphagia, unspecified: Secondary | ICD-10-CM | POA: Diagnosis not present

## 2022-12-02 DIAGNOSIS — Z1211 Encounter for screening for malignant neoplasm of colon: Secondary | ICD-10-CM | POA: Diagnosis not present

## 2022-12-02 DIAGNOSIS — K219 Gastro-esophageal reflux disease without esophagitis: Secondary | ICD-10-CM

## 2022-12-02 DIAGNOSIS — K317 Polyp of stomach and duodenum: Secondary | ICD-10-CM

## 2022-12-02 DIAGNOSIS — K5909 Other constipation: Secondary | ICD-10-CM

## 2022-12-02 MED ORDER — SODIUM CHLORIDE 0.9 % IV SOLN: 500.0000 mL | INTRAVENOUS | Status: DC

## 2022-12-02 NOTE — Progress Notes (Signed)
A and O x3. Report to RN. Tolerated MAC anesthesia well.Gums unchanged after procedure. 

## 2022-12-02 NOTE — Progress Notes (Signed)
Called to room to assist during endoscopic procedure.  Patient ID and intended procedure confirmed with present staff. Received instructions for my participation in the procedure from the performing physician.  

## 2022-12-02 NOTE — Progress Notes (Signed)
Chief Complaint: For GI evaluation  Referring Provider:  Nolene Ebbs, MD      ASSESSMENT AND PLAN;   #1. GERD with occ dysphagia  #2. Chronic constipation. Neg CT AP 06/2022. Due for rpt colon.  #3. IDA, responded well to iron, likely gyn causes. She is scheduled for D&C with Dr Matthew Saras.   Plan: -EGD with dil/colon -CBC, CMP at the time. -Continue protonix '40mg'$  po QD #90, 4RF -Continue Pepcid as needed at night. -Continue miralax prn   I discussed EGD/Colonoscopy- the indications, risks, alternatives and potential complications including, but not limited to bleeding, infection, reaction to meds, damage to internal organs, cardiac and/or pulmonary problems, and perforation requiring surgery. The possibility that significant findings could be missed was explained. All ? were answered. Pt consents to proceed. HPI:    Shawna Thompson is a 77 y.o. female  With HTN, OA, chronic fatigue syndrome, HLD, IDA, obesity, anxiety/depression  With longstanding history of GERD.  She has been having daily heartburn with occasional dysphagia mostly to bread.  Started on Protonix 2 weeks ago with significant improvement.  Has history of esophageal dilatation in 2014 by Dr. Amedeo Plenty.  Was also told that she has a small hiatal hernia.  She denies having any nausea or vomiting.  Also had lower abdominal pain with longstanding history of constipation-better now with MiraLAX/Metamucil/Cinnamon.  Currently having 2 BMs per day.  Her bloating is much better.  No melena or hematochezia. Neg CT AP with contrast 06/2022  No sodas, chocolates, chewing gums, artificial sweeteners and candy. No NSAIDs.  H/O IDA but had postmenopausal vaginal bleeding, appt with Dr Matthew Saras, [facility] scheduled feb 2024. US pelvis apparently showed small uterine polyps/fibroids.  No melena or hematochezia.     Latest Ref Rng & Units 11/09/2022    6:41 AM 11/09/2022    6:34 AM 07/03/2022   10:07 AM  CBC  WBC 4.0 - 10.5 K/uL  6.4   8.5   Hemoglobin 12.0 - 15.0 g/dL 12.2  11.6  11.5   Hematocrit 36.0 - 46.0 % 36.0  36.8  36.4   Platelets 150 - 400 K/uL  262  221       Previous GI work-up: CT AP with contrast 06/2022 1. No acute findings in the abdomen or pelvis. 2. Small hiatal hernia. 3. 2.4 cm LEFT adrenal lesion with density of 34 Hounsfield units, homogeneous appearance and well-circumscribed margins are unchanged from previous imaging dating back to 2021. 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 Due for rpt. 2014-EGD with dil Dr Sabino Dick works as a Presenter, broadcasting, Academic librarian auction on [employer] Past Medical History:  Diagnosis Date   Arthritis    Chronic fatigue syndrome    Constipation    Depression    Dysphagia    GERD (gastroesophageal reflux disease)    Glaucoma    Dr Katy Fitch   High cholesterol    Hypertension    IDA (iron deficiency anemia)    MMT (medial meniscus tear)    right   Obesity    Vertigo     Past Surgical History:  Procedure Laterality Date   CHOLECYSTECTOMY     COLONOSCOPY  09/19/2013   Dr Starr Sinclair [facility]  The entire examined colon is normal   [place] N/A 11/09/2022   Procedure: DILATATION & CURETTAGE/HYSTEROSCOPY WITH MYOSURE;  Surgeon: Molli Posey, MD;  Location: [facility];  Service: Gynecology;  Laterality: N/A;  KNEE ARTHROSCOPY WITH MEDIAL MENISECTOMY Right 06/22/2018   Procedure: RIGHT KNEE ARTHROSCOPY WITH MEDIAL MENISCECTOMY, CHONDROPLASTY;  Surgeon: Leandrew Koyanagi, MD;  Location: [place];  Service: Orthopedics;  Laterality: Right;   SHOULDER ARTHROSCOPY     removal of bone spur   TONSILLECTOMY     TUBAL LIGATION      Family History  Problem Relation Age of Onset   Diabetes Mother    Heart failure Mother    Heart failure Father    Colon cancer Neg Hx    Rectal cancer Neg Hx    Stomach cancer Neg Hx    Esophageal cancer Neg Hx    Liver cancer Neg  Hx    Pancreatic cancer Neg Hx     Social History   Tobacco Use   Smoking status: Never   Smokeless tobacco: Never  Vaping Use   Vaping Use: Never used  Substance Use Topics   Alcohol use: Yes    Comment: rare   Drug use: Never    Current Outpatient Medications  Medication Sig Dispense Refill   atorvastatin (LIPITOR) 40 MG tablet Take 40 mg by mouth daily.     famotidine (PEPCID) 20 MG tablet      hydrochlorothiazide (HYDRODIURIL) 25 MG tablet Take 25 mg by mouth daily.     losartan (COZAAR) 100 MG tablet Take 1 tablet by mouth daily.     pantoprazole (PROTONIX) 40 MG tablet Take 40 mg by mouth daily.     FEROSUL 325 (65 Fe) MG tablet Take 325 mg by mouth daily. (Patient not taking: Reported on 12/02/2022)     meclizine (ANTIVERT) 25 MG tablet Take 1 tablet (25 mg total) by mouth 3 (three) times daily as needed for dizziness. 30 tablet 0   Current Facility-Administered Medications  Medication Dose Route Frequency Provider Last Rate Last Admin   0.9 %  sodium chloride infusion  500 mL Intravenous Continuous Jackquline Denmark, MD        No Known Allergies  Review of Systems:  Constitutional: Denies fever, chills, diaphoresis, appetite change and has fatigue.  HEENT: Denies photophobia, eye pain, redness, hearing loss, ear pain, congestion, sore throat, rhinorrhea, sneezing, mouth sores, neck pain, neck stiffness and tinnitus.   Respiratory: Denies SOB, DOE, cough, chest tightness,  and wheezing.   Cardiovascular: Denies chest pain, palpitations and leg swelling.  Genitourinary: Denies dysuria, urgency, frequency, hematuria, flank pain and difficulty urinating.  Musculoskeletal: Has myalgias, back pain, joint swelling, arthralgias and gait problem.  Skin: No rash.  Neurological: Denies dizziness, seizures, syncope, weakness, light-headedness, numbness and headaches.  Hematological: Denies adenopathy. Easy bruising, personal or family bleeding history  Psychiatric/Behavioral: Has  anxiety or depression     Physical Exam:    Today's Vitals   12/02/22 1248 12/02/22 1329 12/02/22 1330 12/02/22 1335  BP: (!) 150/70 (!) 177/71 (!) 177/71 (!) 169/78  Pulse: 80 81 79 78  Resp:  '12 12 16  '$ Temp: (!) 96.6 F (35.9 C)     SpO2: 97% 98% 98% 95%  Weight: 200 lb (90.7 kg)     Height: '5\' 1"'$  (1.549 m)      Body mass index is 37.79 kg/m.  Constitutional:  Well-developed, in no acute distress. Psychiatric: Normal mood and affect. Behavior is normal. HEENT: Pupils normal.  Conjunctivae are normal. No scleral icterus. Cardiovascular: Normal rate, regular rhythm. No edema Pulmonary/chest: Effort normal and breath sounds normal. No wheezing, rales or rhonchi. Abdominal: Soft, nondistended. Nontender. Bowel sounds  active throughout. There are no masses palpable. No hepatomegaly. Rectal: Deferred.  To be performed at the time of colonoscopy. Neurological: Alert and oriented to person place and time. Skin: Skin is warm and dry. No rashes noted.  Data Reviewed: I have personally reviewed following labs and imaging studies  CBC:    Latest Ref Rng & Units 11/09/2022    6:41 AM 11/09/2022    6:34 AM 07/03/2022   10:07 AM  CBC  WBC 4.0 - 10.5 K/uL  6.4  8.5   Hemoglobin 12.0 - 15.0 g/dL 12.2  11.6  11.5   Hematocrit 36.0 - 46.0 % 36.0  36.8  36.4   Platelets 150 - 400 K/uL  262  221     CMP:    Latest Ref Rng & Units 11/09/2022    6:41 AM 07/03/2022   10:07 AM 05/17/2022   11:43 AM  CMP  Glucose 70 - 99 mg/dL 98  86  104   BUN 8 - 23 mg/dL '13  21  11   '$ Creatinine 0.44 - 1.00 mg/dL 1.10  1.23  0.89   Sodium 135 - 145 mmol/L 143  144  140   Potassium 3.5 - 5.1 mmol/L 3.5  4.0  3.9   Chloride 98 - 111 mmol/L 107  108  106   CO2 22 - 32 mmol/L  24  24   Calcium 8.9 - 10.3 mg/dL  8.8  9.2   Total Protein 6.5 - 8.1 g/dL  6.4    Total Bilirubin 0.3 - 1.2 mg/dL  0.7    Alkaline Phos 38 - 126 U/L  74    AST 15 - 41 U/L  12    ALT 0 - 44 U/L  10     CT reviewed.    Carmell Austria, MD 12/02/2022, 1:38 PM  Cc: Nolene Ebbs, MD

## 2022-12-02 NOTE — Op Note (Signed)
[place] Patient Name: Shawna Thompson Procedure Date: 12/02/2022 1:24 PM MRN: [id number] Endoscopist: Jackquline Denmark , MD, [id number] Age: 77 Referring MD:  Date of Birth: 08/12/1946 Gender: Female Account #: [id number] Procedure:                Upper GI endoscopy Indications:              Dysphagia Medicines:                Monitored Anesthesia Care Procedure:                Pre-Anesthesia Assessment:                           - Prior to the procedure, a History and Physical                            was performed, and patient medications and                            allergies were reviewed. The patient's tolerance of                            previous anesthesia was also reviewed. The risks                            and benefits of the procedure and the sedation                            options and risks were discussed with the patient.                            All questions were answered, and informed consent                            was obtained. Prior Anticoagulants: The patient has                            taken no anticoagulant or antiplatelet agents. ASA                            Grade Assessment: II - A patient with mild systemic                            disease. After reviewing the risks and benefits,                            the patient was deemed in satisfactory condition to                            undergo the procedure.                           After obtaining informed consent, the endoscope was  passed under direct vision. Throughout the                            procedure, the patient's blood pressure, pulse, and                            oxygen saturations were monitored continuously. The                            GIF [id number] was introduced through the                            mouth, and advanced to the second part of duodenum.                            The upper GI endoscopy was accomplished without                             difficulty. The patient tolerated the procedure                            well. Scope In: Scope Out: Findings:                 The lower third of the esophagus was mildly                            tortuous. Biopsies were obtained from the proximal                            and distal esophagus with cold forceps for                            histology to r/o eosinophilic esophagitis.                           LA Grade A (one or more mucosal breaks less than 5                            mm, not extending between tops of 2 mucosal folds)                            esophagitis with no bleeding was found 35 cm from                            the incisors. The scope was withdrawn. Dilation was                            performed with a Maloney dilator with mild                            resistance at 50 Fr and 52 Fr.  A 2 cm hiatal hernia was present.                           Localized mild inflammation characterized by                            erythema was found in the gastric antrum. Biopsies                            were taken with a cold forceps for histology.                           A few (8-10) 2 to 3 mm sessile polyps with no                            bleeding and no stigmata of recent bleeding were                            found in the gastric body and fundus. Three polyps                            were removed with a cold biopsy forceps. Resection                            and retrieval were complete.                           The examined duodenum was normal. Complications:            No immediate complications. Estimated Blood Loss:     Estimated blood loss: none. Impression:               - Presbyesophagus s/p esophageal dilatation.                           - LA Grade A reflux esophagitis with no bleeding.                           - 2 cm hiatal hernia.                           - Gastritis. Biopsied.                            - A few gastric polyps. Resected and retrieved x 3. Recommendation:           - Patient has a contact number available for                            emergencies. The signs and symptoms of potential                            delayed complications were discussed with the  patient. Return to normal activities tomorrow.                            Written discharge instructions were provided to the                            patient.                           - Post dilatation diet.                           - Continue present medications including Protonix                            40 mg p.o. daily.                           - Await pathology results.                           - Avoid nonsteroidals.                           - The findings and recommendations were discussed                            with the patient's family. Jackquline Denmark, MD 12/02/2022 2:11:38 PM This report has been signed electronically.

## 2022-12-02 NOTE — Patient Instructions (Signed)
Handouts given to you today on hemorrhoids, diverticulosis, and hiatal hernia  Avoid NSAIDs ASPIRIN, ASPIRIN CONTAINING PRODUCTS (BC OR GOODY POWDERS) OR NSAIDS (IBUPROFEN, ADVIL, ALEVE, AND MOTRIN); TYLENOL IS OK TO TAKE Recommend Miralax 1 capful (17grams) in 8 ounces of fluid once a day  Continue Protonix 40 mg once a day  Return to office as needed    YOU HAD AN ENDOSCOPIC PROCEDURE TODAY AT [place]:   Refer to the procedure report that was given to you for any specific questions about what was found during the examination.  If the procedure report does not answer your questions, please call your gastroenterologist to clarify.  If you requested that your care partner not be given the details of your procedure findings, then the procedure report has been included in a sealed envelope for you to review at your convenience later.  YOU SHOULD EXPECT: Some feelings of bloating in the abdomen. Passage of more gas than usual.  Walking can help get rid of the air that was put into your GI tract during the procedure and reduce the bloating. If you had a lower endoscopy (such as a colonoscopy or flexible sigmoidoscopy) you may notice spotting of blood in your stool or on the toilet paper. If you underwent a bowel prep for your procedure, you may not have a normal bowel movement for a few days.  Please Note:  You might notice some irritation and congestion in your nose or some drainage.  This is from the oxygen used during your procedure.  There is no need for concern and it should clear up in a day or so.  SYMPTOMS TO REPORT IMMEDIATELY:  Following lower endoscopy (colonoscopy or flexible sigmoidoscopy):  Excessive amounts of blood in the stool  Significant tenderness or worsening of abdominal pains  Swelling of the abdomen that is new, acute  Fever of 100F or higher  Following upper endoscopy (EGD)  Vomiting of blood or coffee ground material  New chest pain or pain under the  shoulder blades  Painful or persistently difficult swallowing  New shortness of breath  Fever of 100F or higher  Black, tarry-looking stools  For urgent or emergent issues, a gastroenterologist can be reached at any hour by calling [phone]. Do not use MyChart messaging for urgent concerns.    DIET:  We do recommend a small meal at first, but then you may proceed to your regular diet.  Drink plenty of fluids but you should avoid alcoholic beverages for 24 hours.  ACTIVITY:  You should plan to take it easy for the rest of today and you should NOT DRIVE or use heavy machinery until tomorrow (because of the sedation medicines used during the test).    FOLLOW UP: Our staff will call the number listed on your records the next business day following your procedure.  We will call around 7:15- 8:00 am to check on you and address any questions or concerns that you may have regarding the information given to you following your procedure. If we do not reach you, we will leave a message.     If any biopsies were taken you will be contacted by phone or by letter within the next 1-3 weeks.  Please call us at [phone] if you have not heard about the biopsies in 3 weeks.    SIGNATURES/CONFIDENTIALITY: You and/or your care partner have signed paperwork which will be entered into your electronic medical record.  These signatures attest to the  fact that that the information above on your After Visit Summary has been reviewed and is understood.  Full responsibility of the confidentiality of this discharge information lies with you and/or your care-partner.

## 2022-12-02 NOTE — Op Note (Signed)
[place] Patient Name: Shawna Thompson Procedure Date: 12/02/2022 1:24 PM MRN: [id number] Endoscopist: Jackquline Denmark , MD, [id number] Age: 77 Referring MD:  Date of Birth: [birth year] Gender: Female Account #: [id number] Procedure:                Colonoscopy Indications:              Screening for colorectal malignant neoplasm Medicines:                Monitored Anesthesia Care Procedure:                Pre-Anesthesia Assessment:                           - Prior to the procedure, a History and Physical                            was performed, and patient medications and                            allergies were reviewed. The patient's tolerance of                            previous anesthesia was also reviewed. The risks                            and benefits of the procedure and the sedation                            options and risks were discussed with the patient.                            All questions were answered, and informed consent                            was obtained. Prior Anticoagulants: The patient has                            taken no anticoagulant or antiplatelet agents. ASA                            Grade Assessment: II - A patient with mild systemic                            disease. After reviewing the risks and benefits,                            the patient was deemed in satisfactory condition to                            undergo the procedure.                           After obtaining informed consent, the colonoscope  was passed under direct vision. Throughout the                            procedure, the patient's blood pressure, pulse, and                            oxygen saturations were monitored continuously. The                            Olympus [id number]) Colonoscope was                            introduced through the anus and advanced to the the                            cecum,  identified by appendiceal orifice and                            ileocecal valve. The colonoscopy was performed                            without difficulty. The patient tolerated the                            procedure well. The quality of the bowel                            preparation was good. The ileocecal valve,                            appendiceal orifice, and rectum were photographed. Scope In: 1:51:54 PM Scope Out: 2:02:45 PM Scope Withdrawal Time: 0 hours 5 minutes 50 seconds  Total Procedure Duration: 0 hours 10 minutes 51 seconds  Findings:                 A few rare small-mouthed diverticula were found in                            the sigmoid colon.                           Non-bleeding internal hemorrhoids were found during                            retroflexion. The hemorrhoids were small and Grade                            I (internal hemorrhoids that do not prolapse).                           The exam was otherwise without abnormality on                            direct and retroflexion views. The colon was highly  redundant. Complications:            No immediate complications. Estimated Blood Loss:     Estimated blood loss: none. Impression:               - Very minimal sigmoid diverticulosis.                           - Non-bleeding internal hemorrhoids.                           - The examination was otherwise normal on direct                            and retroflexion views.                           - No specimens collected. Recommendation:           - Patient has a contact number available for                            emergencies. The signs and symptoms of potential                            delayed complications were discussed with the                            patient. Return to normal activities tomorrow.                            Written discharge instructions were provided to the                             patient.                           - Resume previous diet.                           - Miralax 1 capful (17 grams) in 8 ounces of water                            PO daily.                           - Repeat colonoscopy is not recommended for                            screening purposes. Hence, repeat colonoscopy only                            if with any new problems.                           - Return to GI office PRN.                           -  The findings and recommendations were discussed                            with the patient's family. Jackquline Denmark, MD 12/02/2022 2:14:31 PM This report has been signed electronically.

## 2022-12-03 ENCOUNTER — Telehealth: Payer: Self-pay

## 2022-12-03 NOTE — Telephone Encounter (Signed)
  Follow up Call-     12/02/2022   12:54 PM  Call back number  Post procedure Call Back phone  # [phone]     Patient questions:  Do you have a fever, pain , or abdominal swelling? No. Pain Score  0 *  Have you tolerated food without any problems? Yes.    Have you been able to return to your normal activities? Yes.    Do you have any questions about your discharge instructions: Diet   No. Medications  No. Follow up visit  No.  Do you have questions or concerns about your Care? No.  Actions: * If pain score is 4 or above: No action needed, pain <4.

## 2022-12-12 ENCOUNTER — Encounter: Payer: Self-pay | Admitting: Gastroenterology

## 2023-01-13 ENCOUNTER — Ambulatory Visit: Payer: Medicare PPO | Admitting: Orthopaedic Surgery

## 2023-01-13 ENCOUNTER — Encounter: Payer: Self-pay | Admitting: Orthopaedic Surgery

## 2023-01-13 DIAGNOSIS — M25511 Pain in right shoulder: Secondary | ICD-10-CM | POA: Diagnosis not present

## 2023-01-13 DIAGNOSIS — G8929 Other chronic pain: Secondary | ICD-10-CM | POA: Diagnosis not present

## 2023-01-13 NOTE — Addendum Note (Signed)
Addended by: Wendi Maya on: 01/13/2023 09:52 AM   Modules accepted: Orders

## 2023-01-13 NOTE — Progress Notes (Signed)
Office Visit Note   Patient: Shawna Thompson           Date of Birth: Dec 01, 1945           MRN: [id number] Visit Date: 01/13/2023              Requested by: Fleet Contras, MD [address] [place],  [place] [address] PCP: Fleet Contras, MD   Assessment & Plan: Visit Diagnoses:  1. Chronic right shoulder pain     Plan: Impression 77 year old female with chronic right shoulder pain concerning for structural abnormalities.  We will order MRI at this time.  Follow-up after the MRI.  Follow-Up Instructions: No follow-ups on file.   Orders:  No orders of the defined types were placed in this encounter.  No orders of the defined types were placed in this encounter.     Procedures: No procedures performed   Clinical Data: No additional findings.   Subjective: Chief Complaint  Patient presents with   Right Shoulder - Pain    HPI  Vance Gather returns today for continued right shoulder pain.  Subacromial injection did not really help.  Review of Systems   Objective: Vital Signs: There were no vitals taken for this visit.  Physical Exam  Ortho Exam  Examination right shoulder shows painful range of motion.  Pain with Hawkins impingement and empty can sign.  Pain with cross body adduction.  Specialty Comments:  No specialty comments available.  Imaging: No results found.   PMFS History: Patient Active Problem List   Diagnosis Date Noted   Trigger finger, right middle finger 04/17/2021   Primary osteoarthritis of right knee 12/11/2019   Primary osteoarthritis of left knee 12/11/2019   Bilateral primary osteoarthritis of knee 02/28/2019   Trochanteric bursitis of left hip 10/06/2018   Morbid obesity 08/05/2018   Body mass index 40.0-44.9, adult 08/05/2018   Chondromalacia, right knee    Positive ANA (antinuclear antibody) 06/14/2018   Chronic pain of right knee 06/14/2018   Acute medial meniscus tear, right, initial encounter 06/14/2018   Past  Medical History:  Diagnosis Date   Arthritis    Chronic fatigue syndrome    Constipation    Depression    Dysphagia    GERD (gastroesophageal reflux disease)    Glaucoma    Dr Dione Booze   High cholesterol    Hypertension    IDA (iron deficiency anemia)    MMT (medial meniscus tear)    right   Obesity    Vertigo     Family History  Problem Relation Age of Onset   Diabetes Mother    Heart failure Mother    Heart failure Father    Colon cancer Neg Hx    Rectal cancer Neg Hx    Stomach cancer Neg Hx    Esophageal cancer Neg Hx    Liver cancer Neg Hx    Pancreatic cancer Neg Hx     Past Surgical History:  Procedure Laterality Date   CHOLECYSTECTOMY     COLONOSCOPY  09/19/2013   Dr Barrett [facility]  The entire examined colon is normal   DILATATION & CURETTAGE/HYSTEROSCOPY WITH MYOSURE N/A 11/09/2022   Procedure: DILATATION & CURETTAGE/HYSTEROSCOPY WITH MYOSURE;  Surgeon: Richarda Overlie, MD;  Location: [facility] [place];  Service: Gynecology;  Laterality: N/A;   KNEE ARTHROSCOPY WITH MEDIAL MENISECTOMY Right 06/22/2018   Procedure: RIGHT KNEE ARTHROSCOPY WITH MEDIAL MENISCECTOMY, CHONDROPLASTY;  Surgeon: Tarry Kos, MD;  Location: [facility];  Service: Orthopedics;  Laterality: Right;   SHOULDER ARTHROSCOPY     removal of bone spur   TONSILLECTOMY     TUBAL LIGATION     Social History   Occupational History   Not on file  Tobacco Use   Smoking status: Never   Smokeless tobacco: Never  Vaping Use   Vaping Use: Never used  Substance and Sexual Activity   Alcohol use: Yes    Comment: rare   Drug use: Never   Sexual activity: Not on file

## 2023-02-25 ENCOUNTER — Ambulatory Visit
Admission: RE | Admit: 2023-02-25 | Discharge: 2023-02-25 | Disposition: A | Discharge status: Home / Self Care | Payer: Medicare PPO | Source: Ambulatory Visit | Attending: Orthopaedic Surgery | Admitting: Orthopaedic Surgery

## 2023-02-25 DIAGNOSIS — G8929 Other chronic pain: Secondary | ICD-10-CM

## 2023-03-01 NOTE — Progress Notes (Unsigned)
Office Visit Note   Patient: Shawna Thompson           Date of Birth: 08/20/1946           MRN: [id number] Visit Date: 03/02/2023              Requested by: Fleet Contras, MD [address] [place],  [place] [address] PCP: Fleet Contras, MD   Assessment & Plan: Visit Diagnoses:  1. Rotator cuff syndrome of right shoulder     Plan: Impression 77 year old female with large chronic retracted rotator cuff tear with associated muscle atrophy.  These findings were reviewed with the patient in detail and treatment options were discussed.  She will think about her options and get back to [place].  Follow-Up Instructions: No follow-ups on file.   Orders:  No orders of the defined types were placed in this encounter.  No orders of the defined types were placed in this encounter.     Procedures: No procedures performed   Clinical Data: No additional findings.   Subjective: No chief complaint on file.   HPI Shawna Thompson returns today for MRI review of right shoulder. Review of Systems  Constitutional: Negative.   HENT: Negative.    Eyes: Negative.   Respiratory: Negative.    Cardiovascular: Negative.   Endocrine: Negative.   Musculoskeletal: Negative.   Neurological: Negative.   Hematological: Negative.   Psychiatric/Behavioral: Negative.    All other systems reviewed and are negative.    Objective: Vital Signs: There were no vitals taken for this visit.  Physical Exam Vitals and nursing note reviewed.  Constitutional:      Appearance: She is well-developed.  HENT:     Head: Normocephalic and atraumatic.  Pulmonary:     Effort: Pulmonary effort is normal.  Abdominal:     Palpations: Abdomen is soft.  Musculoskeletal:     Cervical back: Neck supple.  Skin:    General: Skin is warm.     Capillary Refill: Capillary refill takes less than 2 seconds.  Neurological:     Mental Status: She is alert and oriented to person, place, and time.  Psychiatric:         Behavior: Behavior normal.        Thought Content: Thought content normal.        Judgment: Judgment normal.     Ortho Exam Exam of right shoulder is unchanged. Specialty Comments:  No specialty comments available.  Imaging: No results found.   PMFS History: Patient Active Problem List   Diagnosis Date Noted   Trigger finger, right middle finger 04/17/2021   Primary osteoarthritis of right knee 12/11/2019   Primary osteoarthritis of left knee 12/11/2019   Bilateral primary osteoarthritis of knee 02/28/2019   Trochanteric bursitis of left hip 10/06/2018   Morbid obesity (HCC) 08/05/2018   Body mass index 40.0-44.9, adult (HCC) 08/05/2018   Chondromalacia, right knee    Positive ANA (antinuclear antibody) 06/14/2018   Chronic pain of right knee 06/14/2018   Acute medial meniscus tear, right, initial encounter 06/14/2018   Past Medical History:  Diagnosis Date   Arthritis    Chronic fatigue syndrome    Constipation    Depression    Dysphagia    GERD (gastroesophageal reflux disease)    Glaucoma    Dr Dione Booze   High cholesterol    Hypertension    IDA (iron deficiency anemia)    MMT (medial meniscus tear)    right   Obesity  Vertigo     Family History  Problem Relation Age of Onset   Diabetes Mother    Heart failure Mother    Heart failure Father    Colon cancer Neg Hx    Rectal cancer Neg Hx    Stomach cancer Neg Hx    Esophageal cancer Neg Hx    Liver cancer Neg Hx    Pancreatic cancer Neg Hx     Past Surgical History:  Procedure Laterality Date   CHOLECYSTECTOMY     COLONOSCOPY  09/19/2013   Dr Barrett [facility]  The entire examined colon is normal   DILATATION & CURETTAGE/HYSTEROSCOPY WITH MYOSURE N/A 11/09/2022   Procedure: DILATATION & CURETTAGE/HYSTEROSCOPY WITH MYOSURE;  Surgeon: Richarda Overlie, MD;  Location: [facility];  Service: Gynecology;  Laterality: N/A;   KNEE ARTHROSCOPY WITH MEDIAL MENISECTOMY Right 06/22/2018   Procedure:  RIGHT KNEE ARTHROSCOPY WITH MEDIAL MENISCECTOMY, CHONDROPLASTY;  Surgeon: Tarry Kos, MD;  Location: [facility];  Service: Orthopedics;  Laterality: Right;   SHOULDER ARTHROSCOPY     removal of bone spur   TONSILLECTOMY     TUBAL LIGATION     Social History   Occupational History   Not on file  Tobacco Use   Smoking status: Never   Smokeless tobacco: Never  Vaping Use   Vaping Use: Never used  Substance and Sexual Activity   Alcohol use: Yes    Comment: rare   Drug use: Never   Sexual activity: Not on file

## 2023-03-02 ENCOUNTER — Encounter: Payer: Self-pay | Admitting: Orthopaedic Surgery

## 2023-03-02 ENCOUNTER — Ambulatory Visit: Payer: Medicare PPO | Admitting: Orthopaedic Surgery

## 2023-03-02 DIAGNOSIS — M75101 Unspecified rotator cuff tear or rupture of right shoulder, not specified as traumatic: Secondary | ICD-10-CM | POA: Diagnosis not present

## 2023-03-03 ENCOUNTER — Telehealth: Payer: Self-pay | Admitting: Orthopaedic Surgery

## 2023-03-03 ENCOUNTER — Other Ambulatory Visit: Payer: Self-pay

## 2023-03-03 DIAGNOSIS — M75101 Unspecified rotator cuff tear or rupture of right shoulder, not specified as traumatic: Secondary | ICD-10-CM

## 2023-03-03 DIAGNOSIS — S83241A Other tear of medial meniscus, current injury, right knee, initial encounter: Secondary | ICD-10-CM

## 2023-03-03 NOTE — Telephone Encounter (Signed)
Please refer to Dr. August Saucer.  Thanks

## 2023-03-03 NOTE — Telephone Encounter (Signed)
Patient asking to have her surgery scheduled for her shoulder. Please advise

## 2023-03-04 ENCOUNTER — Other Ambulatory Visit: Payer: Self-pay | Admitting: Internal Medicine

## 2023-03-04 LAB — CBC
MCHC: 32.7 g/dL (ref 32.0–36.0)
RDW: 15.3 % — ABNORMAL HIGH (ref 11.0–15.0)
WBC: 7.2 10*3/uL (ref 3.8–10.8)

## 2023-03-05 LAB — COMPLETE METABOLIC PANEL WITH GFR
AST: 116 U/L — ABNORMAL HIGH (ref 10–35)
Creat: 1.01 mg/dL — ABNORMAL HIGH (ref 0.60–1.00)
Globulin: 2.4 g/dL (calc) (ref 1.9–3.7)
Glucose, Bld: 77 mg/dL (ref 65–99)
Potassium: 4.6 mmol/L (ref 3.5–5.3)
Total Bilirubin: 0.8 mg/dL (ref 0.2–1.2)

## 2023-03-05 LAB — LIPID PANEL
HDL: 62 mg/dL (ref >=50)
LDL Cholesterol (Calc): 85 mg/dL (calc)
Total CHOL/HDL Ratio: 2.6 (calc) (ref <5.0)

## 2023-03-05 LAB — CBC
MCH: 25.6 pg — ABNORMAL LOW (ref 27.0–33.0)
MCV: 78.3 fL — ABNORMAL LOW (ref 80.0–100.0)
MPV: 9.9 fL (ref 7.5–12.5)
Platelets: 285 10*3/uL (ref 140–400)

## 2023-03-05 LAB — TSH: TSH: 3.72 mIU/L (ref 0.40–4.50)

## 2023-03-09 LAB — HEPATITIS PANEL, ACUTE
Hep A IgM: NONREACTIVE
Hep B C IgM: NONREACTIVE
Hepatitis B Surface Ag: NONREACTIVE
Hepatitis C Ab: NONREACTIVE

## 2023-03-09 LAB — COMPLETE METABOLIC PANEL WITH GFR
AG Ratio: 1.7 (calc) (ref 1.0–2.5)
ALT: 129 U/L — ABNORMAL HIGH (ref 6–29)
Albumin: 4 g/dL (ref 3.6–5.1)
Alkaline phosphatase (APISO): 183 U/L — ABNORMAL HIGH (ref 37–153)
BUN/Creatinine Ratio: 16 (calc) (ref 6–22)
BUN: 16 mg/dL (ref 7–25)
CO2: 23 mmol/L (ref 20–32)
Calcium: 8.8 mg/dL (ref 8.6–10.4)
Chloride: 106 mmol/L (ref 98–110)
Sodium: 140 mmol/L (ref 135–146)
Total Protein: 6.4 g/dL (ref 6.1–8.1)
eGFR: 58 mL/min/{1.73_m2} — ABNORMAL LOW (ref >=60)

## 2023-03-09 LAB — CBC
HCT: 34.3 % — ABNORMAL LOW (ref 35.0–45.0)
Hemoglobin: 11.2 g/dL — ABNORMAL LOW (ref 11.7–15.5)
RBC: 4.38 10*6/uL (ref 3.80–5.10)

## 2023-03-09 LAB — URINE CULTURE
MICRO NUMBER:: 15020410
SPECIMEN QUALITY:: ADEQUATE

## 2023-03-09 LAB — LIPID PANEL
Cholesterol: 163 mg/dL (ref <200)
Non-HDL Cholesterol (Calc): 101 mg/dL (calc) (ref <130)
Triglycerides: 74 mg/dL (ref <150)

## 2023-03-17 ENCOUNTER — Ambulatory Visit (INDEPENDENT_AMBULATORY_CARE_PROVIDER_SITE_OTHER): Payer: Worker's Compensation | Admitting: Orthopedic Surgery

## 2023-03-17 DIAGNOSIS — M75101 Unspecified rotator cuff tear or rupture of right shoulder, not specified as traumatic: Secondary | ICD-10-CM

## 2023-03-20 ENCOUNTER — Encounter: Payer: Self-pay | Admitting: Orthopedic Surgery

## 2023-03-20 NOTE — Progress Notes (Signed)
Office Visit Note   Patient: Shawna Thompson           Date of Birth: 03/30/1946           MRN: [id number] Visit Date: 03/17/2023 Requested by: Tarry Kos, MD [address] [place],  [place] [address] PCP: Fleet Contras, MD  Subjective: Chief Complaint  Patient presents with   Right Shoulder - Pain    HPI: Shawna Thompson is a 77 y.o. female who presents to the office reporting right shoulder pain since a fall in October.  Pain has worsened over time.  She reports decreased range of motion and weakness.  Hard for her to lay on that right-hand side without pain.  The pain does wake her up from sleep at night.  Pain localizes to the superior anterior and lateral aspect of the shoulder with occasional radiation to the elbow but not below the elbow.  She has had several occasions of pain radiating into digits 2 and 3.  For her job she has to write on the windows of cars.  She does report weakness with that endeavor.  She cannot do her hair.  Hard for her to push herself up out of a chair.  Cortisone injection did help.  She has tried doing her own physical therapy without much improvement.  She states that it "zaps her energy to use her arm".  She does have a daughter at home.  MRI scan shows full-thickness tears with atrophy of the infraspinatus and supraspinatus with some severe subscap tendinosis and complete tear of the biceps tendon.  There is moderate glenohumeral joint and severe AC joint arthritis..                ROS: All systems reviewed are negative as they relate to the chief complaint within the history of present illness.  Patient denies fevers or chills.  Assessment & Plan: Visit Diagnoses:  1. Rotator cuff syndrome of right shoulder     Plan: Impression is right shoulder rotator cuff arthropathy with atrophy along with degenerative changes in the glenohumeral joint.  Patient does have recent increase in liver enzymes.  I think she is a reasonable candidate for  reverse shoulder replacement.  The risk and benefits of the procedure discussed with the patient include not limited to infection or vessel damage incomplete restoration of function as well as incomplete pain relief.  The rationale behind patient specific instrumentation is described with the use of models.  Patient understands the risk and benefits and wishes to proceed.  Patient will need thin cut CT scan prior to surgery.  She does have this liver enzyme issue which she will need to get resolved prior to surgical intervention.  She will follow-up with [place] after surgery as well as after we obtain closure on this liver enzyme problem.  All questions answered  Follow-Up Instructions: No follow-ups on file.   Orders:  No orders of the defined types were placed in this encounter.  No orders of the defined types were placed in this encounter.     Procedures: No procedures performed   Clinical Data: No additional findings.  Objective: Vital Signs: There were no vitals taken for this visit.  Physical Exam:  Constitutional: Patient appears well-developed HEENT:  Head: Normocephalic Eyes:EOM are normal Neck: Normal range of motion Cardiovascular: Normal rate Pulmonary/chest: Effort normal Neurologic: Patient is alert Skin: Skin is warm Psychiatric: Patient has normal mood and affect  Ortho Exam: Ortho exam demonstrates functional  deltoid on the right.  No real asymmetric AC joint tenderness right versus left.  Neck range of motion intact.  Motor or sensory function to the right and left hand intact.  Radial pulse intact.  No masses lymphadenopathy or skin changes noted in that shoulder girdle region.  She does have diminished forward flexion and abduction to just under 90 degrees.  External rotation strength is 3 out of 5 on the right compared to 5 out of 5 on the left.  Some coarseness and grinding is present.  That is with passive range of motion of that right shoulder.  Specialty  Comments:  No specialty comments available.  Imaging: No results found.   PMFS History: Patient Active Problem List   Diagnosis Date Noted   Trigger finger, right middle finger 04/17/2021   Primary osteoarthritis of right knee 12/11/2019   Primary osteoarthritis of left knee 12/11/2019   Bilateral primary osteoarthritis of knee 02/28/2019   Trochanteric bursitis of left hip 10/06/2018   Morbid obesity (HCC) 08/05/2018   Body mass index 40.0-44.9, adult (HCC) 08/05/2018   Chondromalacia, right knee    Positive ANA (antinuclear antibody) 06/14/2018   Chronic pain of right knee 06/14/2018   Acute medial meniscus tear, right, initial encounter 06/14/2018   Past Medical History:  Diagnosis Date   Arthritis    Chronic fatigue syndrome    Constipation    Depression    Dysphagia    GERD (gastroesophageal reflux disease)    Glaucoma    Dr Dione Booze   High cholesterol    Hypertension    IDA (iron deficiency anemia)    MMT (medial meniscus tear)    right   Obesity    Vertigo     Family History  Problem Relation Age of Onset   Diabetes Mother    Heart failure Mother    Heart failure Father    Colon cancer Neg Hx    Rectal cancer Neg Hx    Stomach cancer Neg Hx    Esophageal cancer Neg Hx    Liver cancer Neg Hx    Pancreatic cancer Neg Hx     Past Surgical History:  Procedure Laterality Date   CHOLECYSTECTOMY     COLONOSCOPY  09/19/2013   Dr Barrett [facility]  The entire examined colon is normal   DILATATION & CURETTAGE/HYSTEROSCOPY WITH MYOSURE N/A 11/09/2022   Procedure: DILATATION & CURETTAGE/HYSTEROSCOPY WITH MYOSURE;  Surgeon: Richarda Overlie, MD;  Location: [facility];  Service: Gynecology;  Laterality: N/A;   KNEE ARTHROSCOPY WITH MEDIAL MENISECTOMY Right 06/22/2018   Procedure: RIGHT KNEE ARTHROSCOPY WITH MEDIAL MENISCECTOMY, CHONDROPLASTY;  Surgeon: Tarry Kos, MD;  Location: [facility];  Service: Orthopedics;  Laterality: Right;    SHOULDER ARTHROSCOPY     removal of bone spur   TONSILLECTOMY     TUBAL LIGATION     Social History   Occupational History   Not on file  Tobacco Use   Smoking status: Never   Smokeless tobacco: Never  Vaping Use   Vaping Use: Never used  Substance and Sexual Activity   Alcohol use: Yes    Comment: rare   Drug use: Never   Sexual activity: Not on file

## 2023-03-22 ENCOUNTER — Telehealth: Payer: Self-pay

## 2023-03-22 NOTE — Telephone Encounter (Signed)
-----   Message from Cammy Copa, MD sent at 03/20/2023  4:53 PM EDT ----- Shawna Thompson and Eunice Blase.  Can you let me know if she was posted and if not can you find out what the plan is to figure out her liver enzymes.  I looked in her chart and did not really see any follow-up for her elevated liver numbers.  Also it does not appear that we ordered a thin cut CT scan on her.  Thanks

## 2023-03-22 NOTE — Telephone Encounter (Signed)
See note from Dr August Saucer. Has patient been scheduled?

## 2023-03-23 NOTE — Telephone Encounter (Signed)
I would not order the CT scan until we get approval by [employer]. if that is the way she is going to go with it.  Thanks

## 2023-03-23 NOTE — Telephone Encounter (Signed)
Yes we can do a blue sheet for her.  She does have elevated liver enzymes but can you call her and figure out what she or when she is getting that looked at.  Really need to have the liver enzymes close to normal before surgery.  But okay to proceed with CT scan we can posterior just make sure she is seeing someone about her liver enzymes which I think she is.  Thanks

## 2023-03-29 ENCOUNTER — Telehealth: Payer: Self-pay | Admitting: Orthopedic Surgery

## 2023-03-29 NOTE — Telephone Encounter (Signed)
Pt called requesting  call from Lauren F. She is wondering his numbness in her fingers . Please call pt at [phone]

## 2023-03-29 NOTE — Telephone Encounter (Signed)
The numbness in her fingers is not related to her shoulder arthritis and rotator cuff problem.  We could do the left middle trigger finger at the same time as her left shoulder replacement.  But before we do any of that can you get an update from her on the status of her liver enzymes.  Thanks

## 2023-03-29 NOTE — Telephone Encounter (Signed)
FYI I called and advised pt. She will followup about the liver enzymes once she has her appt on 04/06/23.

## 2023-03-29 NOTE — Telephone Encounter (Signed)
I called and talked to the pt. She stated left middle trigger finger and wants surgery at the same time if possible . She also stated she is starting to have numbness in all her fingers except her thumb and wanted to know if this has to do with her shoulder. Please advise

## 2023-04-12 NOTE — Telephone Encounter (Signed)
Hi [name] I do not see an appointment with her seeing anyone.  Was this a [facility] doctor or someone else?

## 2023-04-12 NOTE — Telephone Encounter (Signed)
Lvm for pt to cb to discuss  

## 2023-04-13 ENCOUNTER — Telehealth: Payer: Self-pay | Admitting: Orthopedic Surgery

## 2023-04-13 NOTE — Telephone Encounter (Signed)
Left 2nd message

## 2023-04-13 NOTE — Telephone Encounter (Signed)
Pt said she has an appt on the 16th about her Liver and will call us after that and update [place] on what is going on

## 2023-04-13 NOTE — Telephone Encounter (Signed)
thx

## 2023-04-20 ENCOUNTER — Other Ambulatory Visit: Payer: Self-pay | Admitting: Internal Medicine

## 2023-04-21 LAB — CBC
HCT: 36.3 % (ref 35.0–45.0)
Hemoglobin: 11.7 g/dL (ref 11.7–15.5)
MCH: 25.3 pg — ABNORMAL LOW (ref 27.0–33.0)
MCHC: 32.2 g/dL (ref 32.0–36.0)
MCV: 78.6 fL — ABNORMAL LOW (ref 80.0–100.0)
MPV: 9.6 fL (ref 7.5–12.5)
Platelets: 294 10*3/uL (ref 140–400)
RBC: 4.62 10*6/uL (ref 3.80–5.10)
RDW: 15.2 % — ABNORMAL HIGH (ref 11.0–15.0)
WBC: 9.1 10*3/uL (ref 3.8–10.8)

## 2023-04-21 LAB — COMPLETE METABOLIC PANEL WITH GFR
AG Ratio: 1.7 (calc) (ref 1.0–2.5)
ALT: 7 U/L (ref 6–29)
AST: 11 U/L (ref 10–35)
Albumin: 4.1 g/dL (ref 3.6–5.1)
Alkaline phosphatase (APISO): 95 U/L (ref 37–153)
BUN: 17 mg/dL (ref 7–25)
CO2: 26 mmol/L (ref 20–32)
Calcium: 9.1 mg/dL (ref 8.6–10.4)
Chloride: 107 mmol/L (ref 98–110)
Creat: 0.92 mg/dL (ref 0.60–1.00)
Globulin: 2.4 g/dL (calc) (ref 1.9–3.7)
Glucose, Bld: 99 mg/dL (ref 65–99)
Potassium: 4.2 mmol/L (ref 3.5–5.3)
Sodium: 141 mmol/L (ref 135–146)
Total Bilirubin: 0.4 mg/dL (ref 0.2–1.2)
Total Protein: 6.5 g/dL (ref 6.1–8.1)
eGFR: 65 mL/min/{1.73_m2} (ref >=60)

## 2023-05-25 ENCOUNTER — Ambulatory Visit: Payer: Medicare PPO | Admitting: Orthopaedic Surgery

## 2023-05-26 ENCOUNTER — Other Ambulatory Visit: Payer: Self-pay

## 2023-05-26 ENCOUNTER — Telehealth: Payer: Self-pay

## 2023-05-26 DIAGNOSIS — G8929 Other chronic pain: Secondary | ICD-10-CM

## 2023-05-26 NOTE — Telephone Encounter (Signed)
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

## 2023-06-02 ENCOUNTER — Other Ambulatory Visit: Payer: Medicare PPO

## 2023-06-02 DIAGNOSIS — G8929 Other chronic pain: Secondary | ICD-10-CM

## 2023-06-03 ENCOUNTER — Telehealth: Payer: Self-pay | Admitting: Orthopedic Surgery

## 2023-06-03 NOTE — Telephone Encounter (Signed)
Please advise 

## 2023-06-03 NOTE — Telephone Encounter (Signed)
Patient called. The fax number to fax the letter to is [phone]. Attn: Sonny Apple at [employer]

## 2023-06-03 NOTE — Telephone Encounter (Signed)
Ok for note thx

## 2023-06-03 NOTE — Telephone Encounter (Signed)
Faxed to provided number  

## 2023-06-03 NOTE — Telephone Encounter (Signed)
Patient aware note is ready, she will call back with a fax number for me to send it to

## 2023-06-03 NOTE — Telephone Encounter (Signed)
Patient called. Would like a note to be out of work until after the surgery. Says she is having a lot of pain and not able to sleep. Her call back number is [phone]

## 2023-06-08 ENCOUNTER — Other Ambulatory Visit: Payer: Self-pay

## 2023-06-08 DIAGNOSIS — G8929 Other chronic pain: Secondary | ICD-10-CM

## 2023-06-11 ENCOUNTER — Ambulatory Visit: Payer: Medicare PPO | Admitting: Orthopedic Surgery

## 2023-06-11 ENCOUNTER — Other Ambulatory Visit (INDEPENDENT_AMBULATORY_CARE_PROVIDER_SITE_OTHER): Payer: Self-pay

## 2023-06-11 ENCOUNTER — Encounter: Payer: Self-pay | Admitting: Orthopedic Surgery

## 2023-06-11 DIAGNOSIS — M79645 Pain in left finger(s): Secondary | ICD-10-CM

## 2023-06-11 DIAGNOSIS — M79644 Pain in right finger(s): Secondary | ICD-10-CM | POA: Diagnosis not present

## 2023-06-11 NOTE — Progress Notes (Signed)
Office Visit Note   Patient: Shawna Thompson           Date of Birth: [birth year]           MRN: [id number] Visit Date: 06/11/2023 Requested by: Fleet Contras, MD [address] [place],  [place] [address] PCP: Fleet Contras, MD  Subjective: Chief Complaint  Patient presents with   Other    Right thumb and left middle finger pain    HPI: Shawna Thompson is a 77 y.o. female who presents to the office reporting right thumb pain of 1 months duration without history of injury.  Localizes pain in the thumb to the IP and MCP joint.  Gives a slightly murky history of possible locking in that thumb but now she just reports pain and diminished range of motion as well as diminished strength.  Does not report any numbness and tingling.  Also describes several month history of left middle finger locking which is classic stenosing tenosynovitis by history.  Does not want to try any type of injections on either digit.  She has a known history of rotator cuff arthropathy and continues to report shoulder pain and weakness with ADLs.  Scheduled for reverse shoulder replacement in October.  Would prefer not to have to have 2 anesthetic agents if she does require intervention for her hands..                ROS: All systems reviewed are negative as they relate to the chief complaint within the history of present illness.  Patient denies fevers or chills.  Assessment & Plan: Visit Diagnoses:  1. Thumb pain, right   2. Pain in left finger(s)     Plan: Impression is right thumb pain with diminished motion at the IP joint and pain with motion at the MCP joint.  Collateral ligaments feel stable at the MCP joint to stress.  Does have some pain and tenderness over the A1 pulley on the right-hand side.  Unclear etiology of symptoms particularly without history of trauma.  Refer to Dr. Michae Kava for further evaluation and management.  He may also consider intervention for the left trigger finger as  well.  Follow-Up Instructions: No follow-ups on file.   Orders:  Orders Placed This Encounter  Procedures   XR Finger Thumb Right   XR Finger Middle Left   Ambulatory referral to Orthopedic Surgery   No orders of the defined types were placed in this encounter.     Procedures: No procedures performed   Clinical Data: No additional findings.  Objective: Vital Signs: There were no vitals taken for this visit.  Physical Exam:  Constitutional: Patient appears well-developed HEENT:  Head: Normocephalic Eyes:EOM are normal Neck: Normal range of motion Cardiovascular: Normal rate Pulmonary/chest: Effort normal Neurologic: Patient is alert Skin: Skin is warm Psychiatric: Patient has normal mood and affect  Ortho Exam: Ortho exam demonstrates diminished IP range of motion on the right to about 30 degrees compared to the left which is about 70.  She does have palpable FPL tendon and she is able to flex it about 10 to 15 degrees.  Extension is intact at the IP joint of the right thumb.  Not too much tenderness with grind test at the [facility] joint.  Does have pain with flexion and extension at the MCP joint but the collaterals feel symmetrically stable to stress at 0 and 30 degrees in both thumbs.  No paresthesias in the thumbs.  Does have tenderness over the  A1 pulley of the left middle finger but full composite range of motion is present on that side  Specialty Comments:  No specialty comments available.  Imaging: XR Finger Middle Left  Result Date: 06/11/2023 AP lateral radiographs left middle finger reviewed.  No acute fracture.  No significant degenerative changes.  XR Finger Thumb Right  Result Date: 06/11/2023 AP lateral radiographs right thumb reviewed.  Mild CMC arthritis is present.  Also some calcification around the IP joint of the thumb on the dorsal and volar aspect but no acute fracture.  No erosive changes in the IP MCP or CMC joints.    PMFS History: Patient Active  Problem List   Diagnosis Date Noted   Trigger finger, right middle finger 04/17/2021   Primary osteoarthritis of right knee 12/11/2019   Primary osteoarthritis of left knee 12/11/2019   Bilateral primary osteoarthritis of knee 02/28/2019   Trochanteric bursitis of left hip 10/06/2018   Morbid obesity (HCC) 08/05/2018   Body mass index 40.0-44.9, adult (HCC) 08/05/2018   Chondromalacia, right knee    Positive ANA (antinuclear antibody) 06/14/2018   Chronic pain of right knee 06/14/2018   Acute medial meniscus tear, right, initial encounter 06/14/2018   Past Medical History:  Diagnosis Date   Arthritis    Chronic fatigue syndrome    Constipation    Depression    Dysphagia    GERD (gastroesophageal reflux disease)    Glaucoma    Dr Dione Booze   High cholesterol    Hypertension    IDA (iron deficiency anemia)    MMT (medial meniscus tear)    right   Obesity    Vertigo     Family History  Problem Relation Age of Onset   Diabetes Mother    Heart failure Mother    Heart failure Father    Colon cancer Neg Hx    Rectal cancer Neg Hx    Stomach cancer Neg Hx    Esophageal cancer Neg Hx    Liver cancer Neg Hx    Pancreatic cancer Neg Hx     Past Surgical History:  Procedure Laterality Date   CHOLECYSTECTOMY     COLONOSCOPY  09/19/2013   Dr Barrett [facility]  The entire examined colon is normal   DILATATION & CURETTAGE/HYSTEROSCOPY WITH MYOSURE N/A 11/09/2022   Procedure: DILATATION & CURETTAGE/HYSTEROSCOPY WITH MYOSURE;  Surgeon: Richarda Overlie, MD;  Location: [facility];  Service: Gynecology;  Laterality: N/A;   KNEE ARTHROSCOPY WITH MEDIAL MENISECTOMY Right 06/22/2018   Procedure: RIGHT KNEE ARTHROSCOPY WITH MEDIAL MENISCECTOMY, CHONDROPLASTY;  Surgeon: Tarry Kos, MD;  Location: [facility];  Service: Orthopedics;  Laterality: Right;   SHOULDER ARTHROSCOPY     removal of bone spur   TONSILLECTOMY     TUBAL LIGATION     Social History    Occupational History   Not on file  Tobacco Use   Smoking status: Never   Smokeless tobacco: Never  Vaping Use   Vaping status: Never Used  Substance and Sexual Activity   Alcohol use: Yes    Comment: rare   Drug use: Never   Sexual activity: Not on file

## 2023-06-14 ENCOUNTER — Telehealth: Payer: Self-pay | Admitting: Orthopedic Surgery

## 2023-06-14 NOTE — Telephone Encounter (Signed)
Patient was put on for this Wednesday in the cancellation spot that opened up

## 2023-06-14 NOTE — Telephone Encounter (Signed)
Patient called advised she is scheduled so far out to see Dr. Fara Boros. Patient asked if she can be referred to another hand specialist for a much sooner appointment? The number to contact patient is [phone]

## 2023-06-16 ENCOUNTER — Other Ambulatory Visit: Payer: Medicare PPO

## 2023-06-16 ENCOUNTER — Ambulatory Visit: Payer: Medicare PPO | Admitting: Orthopedic Surgery

## 2023-06-16 DIAGNOSIS — M65332 Trigger finger, left middle finger: Secondary | ICD-10-CM

## 2023-06-16 DIAGNOSIS — M65311 Trigger thumb, right thumb: Secondary | ICD-10-CM

## 2023-06-16 NOTE — Progress Notes (Signed)
Shawna Thompson - 77 y.o. female MRN [id number]  Date of birth: 1946/08/04  Office Visit Note: Visit Date: 06/16/2023 PCP: Fleet Contras, MD Referred by: Cammy Copa, MD  Subjective: Chief Complaint  Patient presents with   Right Hand - Pain   Left Hand - Pain   HPI: Shawna Thompson is a pleasant 77 y.o. female who presents today for evaluation of bilateral hand complaints.  She is demonstrating significant pain and stiffness at the right thumb which has been present for multiple months, her range of motion of the IP joint has decreased significantly as well.  She is having notable clicking and locking of the left long finger as well.  Neither of these digits have undergone prior treatments.  She has been seen previously by my partner Dr. August Saucer who referred her to me for evaluation, she is refusing any potential injections.  Pertinent ROS were reviewed with the patient and found to be negative unless otherwise specified above in HPI.   Visit Reason: right thumb/ left middle finger Hand dominance: right Occupation: security guard  Diabetic: No Heart/Lung History: none Blood Thinners:  none  Prior Testing/EMG: xrays 06/11/23 Injections (Date): none Treatments: tylenol  Prior Surgery: none   Assessment & Plan: Visit Diagnoses: No diagnosis found.  Plan: Extensive discussion was had the patient today regarding the right thumb and left long finger.  She is demonstrating signs and symptoms consistent with stenosing tenosynovitis of the right thumb as well as the left long finger.  Left long finger is triggering notably on examination today, right thumb has progressed to the point where she has significant limitations to active range of motion of the IP joint with flexion secondary to stiffness and pain.  Given the severity of her ongoing symptoms, she is indicated for bilateral, staged right thumb trigger digit release for stenosing tenosynovitis as well as left long  finger trigger digit release to be performed.  For hygienic and healing purposes, I have recommended that she have these done in staged manner.  She has an upcoming right total shoulder arthroplasty in mid October with Dr. August Saucer, she is interested in having the hands treated prior to this.  We will move forward with surgical scheduling of the left long finger trigger digit release to be performed under MAC.  Risks and benefits of the procedure were discussed, risks including but not limited to infection, bleeding, scarring, stiffness, nerve injury, tendon injury, vascular injury, recurrence of symptoms and need for subsequent operation.  Patient expressed understanding.    Once she is fully recovered from the left side, we can move forward with right thumb trigger digit release subsequently.  Will attempt to perform both of these procedures prior to October 10 which is her total shoulder arthroplasty with Dr. August Saucer.    Follow-up: No follow-ups on file.   Meds & Orders: No orders of the defined types were placed in this encounter.  No orders of the defined types were placed in this encounter.    Procedures: No procedures performed      Clinical History: No specialty comments available.  She reports that she has never smoked. She has never used smokeless tobacco. No results for input(s): "HGBA1C", "LABURIC" in the last 8760 hours.  Objective:   Vital Signs: There were no vitals taken for this visit.  Physical Exam  Gen: Well-appearing, in no acute distress; non-toxic CV: Regular Rate. Well-perfused. Warm.  Resp: Breathing unlabored on room air; no wheezing. Psych: Fluid  speech in conversation; appropriate affect; normal thought process  Ortho Exam Left hand: - Palpable nodule over the A1 long finger, notable clicking with deep flexion - Remaining digits with appropriate range of motion - Hand is warm well-perfused, sensation intact in all distributions  Right hand: - Significant  pain at the A1 region of the thumb, palpable nodule - Minimal IP flexion active, able to achieve passive motion at the IP 0 to 45 degrees - Sensation intact distally, thumb is warm well-perfused - Remaining digits with appropriate range of motion, no triggering  Imaging: No results found.  Past Medical/Family/Surgical/Social History: Medications & Allergies reviewed per EMR, new medications updated. Patient Active Problem List   Diagnosis Date Noted   Trigger finger, right middle finger 04/17/2021   Primary osteoarthritis of right knee 12/11/2019   Primary osteoarthritis of left knee 12/11/2019   Bilateral primary osteoarthritis of knee 02/28/2019   Trochanteric bursitis of left hip 10/06/2018   Morbid obesity (HCC) 08/05/2018   Body mass index 40.0-44.9, adult (HCC) 08/05/2018   Chondromalacia, right knee    Positive ANA (antinuclear antibody) 06/14/2018   Chronic pain of right knee 06/14/2018   Acute medial meniscus tear, right, initial encounter 06/14/2018   Past Medical History:  Diagnosis Date   Arthritis    Chronic fatigue syndrome    Constipation    Depression    Dysphagia    GERD (gastroesophageal reflux disease)    Glaucoma    Dr Dione Booze   High cholesterol    Hypertension    IDA (iron deficiency anemia)    MMT (medial meniscus tear)    right   Obesity    Vertigo    Family History  Problem Relation Age of Onset   Diabetes Mother    Heart failure Mother    Heart failure Father    Colon cancer Neg Hx    Rectal cancer Neg Hx    Stomach cancer Neg Hx    Esophageal cancer Neg Hx    Liver cancer Neg Hx    Pancreatic cancer Neg Hx    Past Surgical History:  Procedure Laterality Date   CHOLECYSTECTOMY     COLONOSCOPY  09/19/2013   Dr [facility]  The entire examined colon is normal   DILATATION & CURETTAGE/HYSTEROSCOPY WITH MYOSURE N/A 11/09/2022   Procedure: DILATATION & CURETTAGE/HYSTEROSCOPY WITH MYOSURE;  Surgeon: Richarda Overlie, MD;  Location: [facility];  Service: Gynecology;  Laterality: N/A;   KNEE ARTHROSCOPY WITH MEDIAL MENISECTOMY Right 06/22/2018   Procedure: RIGHT KNEE ARTHROSCOPY WITH MEDIAL MENISCECTOMY, CHONDROPLASTY;  Surgeon: Tarry Kos, MD;  Location: [facility];  Service: Orthopedics;  Laterality: Right;   SHOULDER ARTHROSCOPY     removal of bone spur   TONSILLECTOMY     TUBAL LIGATION     Social History   Occupational History   Not on file  Tobacco Use   Smoking status: Never   Smokeless tobacco: Never  Vaping Use   Vaping status: Never Used  Substance and Sexual Activity   Alcohol use: Yes    Comment: rare   Drug use: Never   Sexual activity: Not on file    Dugan Vanhoesen Trevor Mace, M.D. [facility] 9:26 AM

## 2023-06-24 ENCOUNTER — Other Ambulatory Visit: Payer: Self-pay | Admitting: Orthopedic Surgery

## 2023-06-24 ENCOUNTER — Telehealth: Payer: Self-pay | Admitting: Orthopedic Surgery

## 2023-06-24 DIAGNOSIS — M65332 Trigger finger, left middle finger: Secondary | ICD-10-CM

## 2023-06-24 DIAGNOSIS — M65311 Trigger thumb, right thumb: Secondary | ICD-10-CM

## 2023-06-24 MED ORDER — OXYCODONE HCL 5 MG PO TABS: 5.0000 mg | ORAL_TABLET | ORAL | 0 refills | Status: AC | PRN

## 2023-06-24 NOTE — Telephone Encounter (Signed)
Orthopedic Telephone Note  Patient had surgery with my partner today, Dr. Fara Boros.  He is unable to fill her narcotic prescriptions due to lack of internet connection.  Narcotics were sent in to cover for him.  London Sheer, MD Orthopedic Surgeon

## 2023-06-30 NOTE — Therapy (Signed)
OUTPATIENT OCCUPATIONAL THERAPY ORTHO EVALUATION  Patient Name: Shawna Thompson MRN: [id number] DOB:12/30/1945, 77 y.o., female Today's Date: 07/01/2023  PCP: Fleet Contras, MD REFERRING PROVIDER:  Samuella Cota, MD    END OF SESSION:  OT End of Session - 07/01/23 0820     Visit Number 1    Number of Visits 1    Authorization Type [employer]    OT Start Time [phone]    OT Stop Time 0907    OT Time Calculation (min) 46 min    Equipment Utilized During Treatment orthotic materials    Activity Tolerance Patient tolerated treatment well;No increased pain;Patient limited by pain;Patient limited by fatigue    Behavior During Therapy [facility] for tasks assessed/performed             Past Medical History:  Diagnosis Date   Arthritis    Chronic fatigue syndrome    Constipation    Depression    Dysphagia    GERD (gastroesophageal reflux disease)    Glaucoma    Dr Dione Booze   High cholesterol    Hypertension    IDA (iron deficiency anemia)    MMT (medial meniscus tear)    right   Obesity    Vertigo    Past Surgical History:  Procedure Laterality Date   CHOLECYSTECTOMY     COLONOSCOPY  09/19/2013   Dr [facility]  The entire examined colon is normal   DILATATION & CURETTAGE/HYSTEROSCOPY WITH MYOSURE N/A 11/09/2022   Procedure: DILATATION & CURETTAGE/HYSTEROSCOPY WITH MYOSURE;  Surgeon: Richarda Overlie, MD;  Location[facility];  Service: Gynecology;  Laterality: N/A;   KNEE ARTHROSCOPY WITH MEDIAL MENISECTOMY Right 06/22/2018   Procedure: RIGHT KNEE ARTHROSCOPY WITH MEDIAL MENISCECTOMY, CHONDROPLASTY;  Surgeon: Tarry Kos, MD;  Location: [facility];  Service: Orthopedics;  Laterality: Right;   SHOULDER ARTHROSCOPY     removal of bone spur   TONSILLECTOMY     TUBAL LIGATION     Patient Active Problem List   Diagnosis Date Noted   Trigger finger, right middle finger 04/17/2021   Primary osteoarthritis of right knee 12/11/2019    Primary osteoarthritis of left knee 12/11/2019   Bilateral primary osteoarthritis of knee 02/28/2019   Trochanteric bursitis of left hip 10/06/2018   Morbid obesity (HCC) 08/05/2018   Body mass index 40.0-44.9, adult (HCC) 08/05/2018   Chondromalacia, right knee    Positive ANA (antinuclear antibody) 06/14/2018   Chronic pain of right knee 06/14/2018   Acute medial meniscus tear, right, initial encounter 06/14/2018    ONSET DATE: DOS 06/24/23  REFERRING DIAG:  M65.311 (ICD-10-CM) - Trigger thumb, right thumb  M65.332 (ICD-10-CM) - Trigger finger, left middle finger    THERAPY DIAG:  Localized edema  Muscle weakness (generalized)  Other lack of coordination  Pain in left hand  Pain in right hand  Rationale for Evaluation and Treatment: Rehabilitation  SUBJECTIVE:   SUBJECTIVE STATEMENT: She is 1 week post Lt MF TFR and she also has a scheduled surgery for right thumb triggering next week.  She arrives in bulky dressings, stating she tries to move her hand and it is a little sore.Marland Kitchen     PERTINENT HISTORY: Per MD: "HEP for trigger finger release "   PRECAUTIONS: None  RED FLAGS: None   WEIGHT BEARING RESTRICTIONS: Yes no gripping, push/pull, etc for 1 month recommended in Lt hand   PAIN:  Are you having pain? Yes: NPRS scale: 3-4/10 Pain location: Lt MF  MCP J volar Pain description: aching Aggravating factors: weightbearing Relieving factors: rest, ice  FALLS: Has patient fallen in last 6 months? No  LIVING ENVIRONMENT: Lives with: lives with their family  PLOF: Independent  PATIENT GOALS: to help heal Lt hand and understand surgery and recovery    NEXT MD VISIT: 07/07/23    OBJECTIVE: (All objective assessments below are from initial evaluation on: 07/01/23 unless otherwise specified.)    HAND DOMINANCE: Right   ADLs: Overall ADLs: States decreased ability to grab, hold household objects, pain and difficulty to open containers, perform FMS tasks  (manipulate fasteners on clothing), mild to moderate bathing problems as well.    FUNCTIONAL OUTCOME MEASURES: Eval: Quick DASH 38% impairment today  (Higher % Score  =  More Impairment)     UPPER EXTREMITY ROM     Shoulder to Wrist AROM Right eval Left eval  Wrist flexion [facility] WFL  Wrist extension WFL WFL  (Blank rows = not tested)   Hand AROM Right eval Left eval  Full Fist Ability (or Gap to Distal Palmar Crease) yes Slightly limited due to post op swelling and pain approx 2 cm gap to [facility]  Thumb Opposition  (Kapandji Scale)  6/10 9/10  Thumb MCP (0-60) Limited, pain Full  Thumb IP (0-80) Limited, pain full  Index MCP (0-90)     Index PIP (0-100)     Index DIP (0-70)      Long MCP (0-90)   Slight limited, pain  approx  0 - 75*  Long PIP (0-100)   Approx 0 - 80   Long DIP (0-70)   Approx 0 - 60   (Blank rows = not tested)   HAND FUNCTION: Eval: Observed weakness in affected Lt hand, partially due to recent surgery  COORDINATION: Eval: Observed coordination impairments with affected  Lt hand, partially due to recent surgery   SENSATION: Eval:  Light touch intact today, though diminished around sx area    EDEMA:   Eval:  Mildly swollen in Lt hand today  COGNITION: Eval: Overall cognitive status: WFL for evaluation today   OBSERVATIONS:   Eval: Surgical site looks clean and well-healing sutures in place.  Right thumb has apparent triggering   TODAY'S TREATMENT:  Post-evaluation treatment:  Firstly she was given safety education to keep her sutures clean and dry.  She can use an alcohol wipe to clean that area if needed and she was recommended to look at it at least once a day to check for any signs of infection.  She should cover it with a Band-Aid at this point it does not need bulky dressings.  She should avoid pushing pulling grasping or weightbearing through the left palm now.  Additionally for the triggering right thumb, she was educated on trigger finger  and given a Band-Aid to wear over the IP joint which limits motion and decreases pain and triggering.  She states this works well.  Out of concern for her habit of making a fist in the night, to help prevent flexion contractures in the left hand OT fabricates a custom hand-based nighttime only orthosis to extend the fingers of the left hand (only index and middle fingers included).  It fits well and works well, and she was educated to only wear it in the night to prevent flexion contractures.  She was given a home exercise program in the day that includes the following information for active and passive range of motion as well as gentle scar massage  just to begin 3 days after stitches come out (if wound is healed).  Information was as listed below, and she was given picture examples, and therapist demonstration.  After thumb surgery, she should follow the same process as listed below-she states understanding and leaves in no significant pain or problems   "Active and PROM exercises were educated on and should be done 3-4 times a day, 15 repetitions. Included with the exercises  should be the passive and active hook-fist exercise.  (See exercises below)   Gentle massage along the palm and between the digits provides a pain-dampening effect and scar mobilization can be started ~3-4 days afte rstitches removed.   She was encouraged to avoid activities that require repetitive grasping, a power grip or repetitive pinching the initial month following surgery. Wearing gloves (with a cushion interface along the palm/volar aspect of the metacarpal heads) are encouraged when performing light activities associated with activities such as gardening. Such activities should be performed for limited time frames (e.g. 15-30 minute sessions, 2-3 times a week, during the initial 4-6 weeks postop)."   PATIENT EDUCATION: Education details: See tx section above for details  Person educated: Patient Education method: Verbal  Instruction, Teach back, Handouts  Education comprehension: States and demonstrates understanding   HOME EXERCISE PROGRAM: See tx section above for details    GOALS: Goals reviewed with patient? Yes   SHORT TERM GOALS: (STG required if POC>30 days) Target Date: 07/01/23  Pt will obtain protective, custom orthotic. Goal status: MET   2.  Pt will demo/state understanding of initial HEP and recommendations to improve the use of her left hand and prevent contractures and stiffness as well as anything that could harm her surgery.  She was also given information for right hand to work on postop as well.  Goal status: MET  ASSESSMENT:  CLINICAL IMPRESSION: Patient is a 77 y.o. female who was seen today for occupational therapy evaluation for aftercare for left hand middle finger trigger finger release and stiffness, swelling, pain and decreased ability-as well as recommendations for upcoming right thumb trigger finger release.  She got a home exercise program with loads of safety education as well as a scar pad, and also recommendations for how to advance over the next month until she can achieve full activities.  She stated understanding these recommendations and felt comfortable discharging to the home exercise program.  This is also what the doctor ordered.  She will follow-up with him next week for suture removal and for any additional problems.  She could be sent back to therapy for any additional needs with a new order.  PERFORMANCE DEFICITS: in functional skills including ADLs, IADLs, coordination, dexterity, edema, ROM, strength, pain, fascial restrictions, flexibility, Fine motor control, body mechanics, endurance, wound, and UE functional use, cognitive skills including problem solving and safety awareness, and psychosocial skills including coping strategies, environmental adaptation, and habits.   IMPAIRMENTS: are limiting patient from ADLs, IADLs, leisure, and social participation.    COMORBIDITIES: may have co-morbidities  that affects occupational performance. Patient will benefit from skilled OT to address above impairments and improve overall function.  MODIFICATION OR ASSISTANCE TO COMPLETE EVALUATION: No modification of tasks or assist necessary to complete an evaluation.  OT OCCUPATIONAL PROFILE AND HISTORY: Problem focused assessment: Including review of records relating to presenting problem.  CLINICAL DECISION MAKING: LOW - limited treatment options, no task modification necessary  REHAB POTENTIAL: Excellent  EVALUATION COMPLEXITY: Low      PLAN:  OT FREQUENCY:  one time visit  OT DURATION:  1 sessions 07/01/23  PLANNED INTERVENTIONS: self care/ADL training, therapeutic exercise, therapeutic activity, scar mobilization, splinting, compression bandaging, moist heat, cryotherapy, contrast bath, patient/family education, and coping strategies training  RECOMMENDED OTHER SERVICES: none now   CONSULTED AND AGREED WITH PLAN OF CARE: Patient  PLAN FOR NEXT SESSION: N/A   Fannie Knee, OTR/L, CHT 07/01/2023, 3:15 PM    Referring diagnosis?  M65.311 (ICD-10-CM) - Trigger thumb, right thumb  M65.332 (ICD-10-CM) - Trigger finger, left middle finger   Treatment diagnosis? (if different than referring diagnosis)  ICD-10-CM: R60.0     2.   Muscle weakness (generalized)       ICD-10-CM: M62.81     3.   Other lack of coordination       ICD-10-CM: R27.8     4.   Pain in left hand       ICD-10-CM: M79.642     5.   Pain in right hand       ICD-10-CM: M79.641     What was this (referring dx) caused by? [x]  Surgery []  Fall [x]  Ongoing issue []  Arthritis []  Other: ____________  Laterality: []  Rt []  Lt [x]  Both  Check all possible CPT codes:  *CHOOSE 10 OR LESS*    [x]  40981 (Therapeutic Exercise)  []  92507 (SLP Treatment)  []  97112 (Neuro Re-ed)   []  92526 (Swallowing Treatment)   []  19147 (Gait Training)   []  [id number] (Cognitive  Training, 1st 15 minutes) [x]  97140 (Manual Therapy)   []  97130 (Cognitive Training, each add'l 15 minutes)  []  97164 (Re-evaluation)                              []  Other, List CPT Code ____________  [x]  97530 (Therapeutic Activities)     [x]  97535 (Self Care)   []  All codes above (97110 - 97535)  []  97012 (Mechanical Traction)  []  97014 (E-stim Unattended)  []  97032 (E-stim manual)  []  97033 (Ionto)  []  97035 (Ultrasound) []  97750 (Physical Performance Training) []  [id number] (Aquatic Therapy) []  97016 (Vasopneumatic Device) []  [id number] (Paraffin) []  97034 (Contrast Bath) []  97597 (Wound Care 1st 20 sq cm) []  97598 (Wound Care each add'l 20 sq cm) [x]  97760 (Orthotic Fabrication, Fitting, Training Initial) []  [id number] (Prosthetic Management and Training Initial) []  [id number] (Orthotic or Prosthetic Training/ Modification Subsequent)

## 2023-07-01 ENCOUNTER — Ambulatory Visit: Payer: Medicare PPO | Admitting: Rehabilitative and Restorative Service Providers"

## 2023-07-01 ENCOUNTER — Other Ambulatory Visit: Payer: Self-pay

## 2023-07-01 ENCOUNTER — Encounter: Payer: Self-pay | Admitting: Rehabilitative and Restorative Service Providers"

## 2023-07-01 DIAGNOSIS — M6281 Muscle weakness (generalized): Secondary | ICD-10-CM

## 2023-07-01 DIAGNOSIS — R278 Other lack of coordination: Secondary | ICD-10-CM

## 2023-07-01 DIAGNOSIS — R600 Localized edema: Secondary | ICD-10-CM | POA: Diagnosis not present

## 2023-07-01 DIAGNOSIS — M79642 Pain in left hand: Secondary | ICD-10-CM | POA: Diagnosis not present

## 2023-07-01 DIAGNOSIS — M79641 Pain in right hand: Secondary | ICD-10-CM

## 2023-07-05 NOTE — Pre-Procedure Instructions (Signed)
Surgical Instructions   Your procedure is scheduled on July 15, 2023. Report to [facility] Main Entrance "A" at 5:30 A.M., then check in with the Admitting office. Any questions or running late day of surgery: call [phone]  Questions prior to your surgery date: call [phone], Monday-Friday, 8am-4pm. If you experience any cold or flu symptoms such as cough, fever, chills, shortness of breath, etc. between now and your scheduled surgery, please notify us at the above number.     Remember:  Do not eat after midnight the night before your surgery  You may drink clear liquids until 4:30 AM the morning of your surgery.   Clear liquids allowed are: Water, Non-Citrus Juices (without pulp), Carbonated Beverages, Clear Tea, Black Coffee Only (NO MILK, CREAM OR POWDERED CREAMER of any kind), and Gatorade.  Patient Instructions  The night before surgery:  No food after midnight. ONLY clear liquids after midnight  The day of surgery (if you do NOT have diabetes):  Drink ONE (1) Pre-Surgery Clear Ensure by 4:30 AM the morning of surgery. Drink in one sitting. Do not sip.  This drink was given to you during your hospital  pre-op appointment visit.  Nothing else to drink after completing the  Pre-Surgery Clear Ensure.         If you have questions, please contact your surgeon's office.     Take these medicines the morning of surgery with A SIP OF WATER: atorvastatin (LIPITOR)  famotidine (PEPCID)  pantoprazole (PROTONIX)    May take these medicines IF NEEDED: meclizine (ANTIVERT)    One week prior to surgery, STOP taking any Aspirin (unless otherwise instructed by your surgeon) Aleve, Naproxen, Ibuprofen, Motrin, Advil, Goody's, BC's, all herbal medications, fish oil, and non-prescription vitamins.                     Do NOT Smoke (Tobacco/Vaping) for 24 hours prior to your procedure.  If you use a CPAP at night, you may bring your mask/headgear for your overnight stay.    You will be asked to remove any contacts, glasses, piercing's, hearing aid's, dentures/partials prior to surgery. Please bring cases for these items if needed.    Patients discharged the day of surgery will not be allowed to drive home, and someone needs to stay with them for 24 hours.  SURGICAL WAITING ROOM VISITATION Patients may have no more than 2 support people in the waiting area - these visitors may rotate.   Pre-op nurse will coordinate an appropriate time for 1 ADULT support person, who may not rotate, to accompany patient in pre-op.  Children under the age of 81 must have an adult with them who is not the patient and must remain in the main waiting area with an adult.  If the patient needs to stay at the hospital during part of their recovery, the visitor guidelines for inpatient rooms apply.  Please refer to the [facility] website for the visitor guidelines for any additional information.   If you received a COVID test during your pre-op visit  it is requested that you wear a mask when out in public, stay away from anyone that may not be feeling well and notify your surgeon if you develop symptoms. If you have been in contact with anyone that has tested positive in the last 10 days please notify you surgeon.      Pre-operative 5 CHG Bathing Instructions   You can play a key role in reducing the risk  of infection after surgery. Your skin needs to be as free of germs as possible. You can reduce the number of germs on your skin by washing with CHG (chlorhexidine gluconate) soap before surgery. CHG is an antiseptic soap that kills germs and continues to kill germs even after washing.   DO NOT use if you have an allergy to chlorhexidine/CHG or antibacterial soaps. If your skin becomes reddened or irritated, stop using the CHG and notify one of our RNs at [phone].   Please shower with the CHG soap starting 4 days before surgery using the following schedule:     Please keep  in mind the following:  DO NOT shave, including legs and underarms, starting the day of your first shower.   You may shave your face at any point before/day of surgery.  Place clean sheets on your bed the day you start using CHG soap. Use a clean washcloth (not used since being washed) for each shower. DO NOT sleep with pets once you start using the CHG.   CHG Shower Instructions:  Wash your face and private area with normal soap. If you choose to wash your hair, wash first with your normal shampoo.  After you use shampoo/soap, rinse your hair and body thoroughly to remove shampoo/soap residue.  Turn the water OFF and apply about 3 tablespoons (45 ml) of CHG soap to a CLEAN washcloth.  Apply CHG soap ONLY FROM YOUR NECK DOWN TO YOUR TOES (washing for 3-5 minutes)  DO NOT use CHG soap on face, private areas, open wounds, or sores.  Pay special attention to the area where your surgery is being performed.  If you are having back surgery, having someone wash your back for you may be helpful. Wait 2 minutes after CHG soap is applied, then you may rinse off the CHG soap.  Pat dry with a clean towel  Put on clean clothes/pajamas   If you choose to wear lotion, please use ONLY the CHG-compatible lotions on the back of this paper.   Additional instructions for the day of surgery: DO NOT APPLY any lotions, deodorants, cologne, or perfumes.   Do not bring valuables to the hospital. [facility] is not responsible for any belongings/valuables. Do not wear nail polish, gel polish, artificial nails, or any other type of covering on natural nails (fingers and toes) Do not wear jewelry or makeup Put on clean/comfortable clothes.  Please brush your teeth.  Ask your nurse before applying any prescription medications to the skin.     CHG Compatible Lotions   Aveeno Moisturizing lotion  Cetaphil Moisturizing Cream  Cetaphil Moisturizing Lotion  Clairol Herbal Essence Moisturizing Lotion, Dry Skin   Clairol Herbal Essence Moisturizing Lotion, Extra Dry Skin  Clairol Herbal Essence Moisturizing Lotion, Normal Skin  Curel Age Defying Therapeutic Moisturizing Lotion with Alpha Hydroxy  Curel Extreme Care Body Lotion  Curel Soothing Hands Moisturizing Hand Lotion  Curel Therapeutic Moisturizing Cream, Fragrance-Free  Curel Therapeutic Moisturizing Lotion, Fragrance-Free  Curel Therapeutic Moisturizing Lotion, Original Formula  Eucerin Daily Replenishing Lotion  Eucerin Dry Skin Therapy Plus Alpha Hydroxy Crme  Eucerin Dry Skin Therapy Plus Alpha Hydroxy Lotion  Eucerin Original Crme  Eucerin Original Lotion  Eucerin Plus Crme Eucerin Plus Lotion  Eucerin TriLipid Replenishing Lotion  Keri Anti-Bacterial Hand Lotion  Keri Deep Conditioning Original Lotion Dry Skin Formula Softly Scented  Keri Deep Conditioning Original Lotion, Fragrance Free Sensitive Skin Formula  Keri Lotion Fast Absorbing Fragrance Free Sensitive Skin Formula  Keri  Lotion Fast Absorbing Softly Scented Dry Skin Formula  Keri Original Lotion  Keri Skin Renewal Lotion Keri Silky Smooth Lotion  Keri Silky Smooth Sensitive Skin Lotion  Nivea Body Creamy Conditioning Patent examiner Moisturizing Lotion Nivea Crme  Nivea Skin Firming Lotion  NutraDerm 30 Skin Lotion  NutraDerm Skin Lotion  NutraDerm Therapeutic Skin Cream  NutraDerm Therapeutic Skin Lotion  ProShield Protective Hand Cream  Provon moisturizing lotion  Garza-Salinas II- Preparing for Total Shoulder Arthroplasty  Before surgery, you can play an important role. Because skin is not sterile, your skin needs to be as free of germs as possible. You can reduce the number of germs on your skin by using the following products.   Benzoyl Peroxide Gel  o Reduces the number of germs present on the skin  o Applied twice a day to shoulder area starting two days before surgery    Chlorhexidine Gluconate (CHG) Soap (instructions listed above on how to wash with CHG Soap)  o An antiseptic cleaner that kills germs and bonds with the skin to continue killing germs even after washing  o Used for showering the night before surgery and morning of surgery   ==================================================================  Please follow these instructions carefully:  BENZOYL PEROXIDE 5% GEL  Please do not use if you have an allergy to benzoyl peroxide. If your skin becomes reddened/irritated stop using the benzoyl peroxide.  Starting two days before surgery, apply as follows:  1. Apply benzoyl peroxide in the morning and at night. Apply after taking a shower. If you are not taking a shower clean entire shoulder front, back, and side along with the armpit with a clean wet washcloth.  2. Place a quarter-sized dollop on your SHOULDER and rub in thoroughly, making sure to cover the front, back, and side of your shoulder, along with the armpit.   2 Days prior to Surgery First Dose on _____________ Morning Second Dose on ______________ Night  Day Before Surgery First Dose on ______________ Morning Night before surgery wash (entire body except face and private areas) with CHG Soap THEN Second Dose on ____________ Night   Morning of Surgery  wash BODY AGAIN with CHG Soap   4. Do NOT apply benzoyl peroxide gel on the day of surgery   Please read over the following fact sheets that you were given.

## 2023-07-06 ENCOUNTER — Encounter (HOSPITAL_COMMUNITY): Payer: Self-pay

## 2023-07-06 ENCOUNTER — Encounter (HOSPITAL_COMMUNITY)
Admission: RE | Admit: 2023-07-06 | Discharge: 2023-07-06 | Disposition: A | Discharge status: Home / Self Care | Payer: Medicare PPO | Source: Ambulatory Visit | Attending: Orthopedic Surgery | Admitting: Orthopedic Surgery

## 2023-07-06 ENCOUNTER — Other Ambulatory Visit: Payer: Self-pay

## 2023-07-06 VITALS — BP 158/65 | HR 73 | Temp 98.3°F | Resp 18 | Ht 61.0 in | Wt 207.7 lb

## 2023-07-06 DIAGNOSIS — I2510 Atherosclerotic heart disease of native coronary artery without angina pectoris: Secondary | ICD-10-CM | POA: Diagnosis not present

## 2023-07-06 DIAGNOSIS — N950 Postmenopausal bleeding: Secondary | ICD-10-CM | POA: Diagnosis not present

## 2023-07-06 DIAGNOSIS — K219 Gastro-esophageal reflux disease without esophagitis: Secondary | ICD-10-CM | POA: Insufficient documentation

## 2023-07-06 DIAGNOSIS — D509 Iron deficiency anemia, unspecified: Secondary | ICD-10-CM | POA: Insufficient documentation

## 2023-07-06 DIAGNOSIS — I10 Essential (primary) hypertension: Secondary | ICD-10-CM | POA: Insufficient documentation

## 2023-07-06 DIAGNOSIS — E7800 Pure hypercholesterolemia, unspecified: Secondary | ICD-10-CM | POA: Insufficient documentation

## 2023-07-06 DIAGNOSIS — K449 Diaphragmatic hernia without obstruction or gangrene: Secondary | ICD-10-CM | POA: Insufficient documentation

## 2023-07-06 DIAGNOSIS — R1310 Dysphagia, unspecified: Secondary | ICD-10-CM | POA: Diagnosis not present

## 2023-07-06 DIAGNOSIS — G9332 Myalgic encephalomyelitis/chronic fatigue syndrome: Secondary | ICD-10-CM | POA: Insufficient documentation

## 2023-07-06 DIAGNOSIS — R42 Dizziness and giddiness: Secondary | ICD-10-CM | POA: Insufficient documentation

## 2023-07-06 DIAGNOSIS — M75101 Unspecified rotator cuff tear or rupture of right shoulder, not specified as traumatic: Secondary | ICD-10-CM | POA: Insufficient documentation

## 2023-07-06 DIAGNOSIS — Z01818 Encounter for other preprocedural examination: Secondary | ICD-10-CM | POA: Diagnosis present

## 2023-07-06 DIAGNOSIS — H409 Unspecified glaucoma: Secondary | ICD-10-CM | POA: Diagnosis not present

## 2023-07-06 HISTORY — DX: Personal history of other diseases of the digestive system: Z87.19

## 2023-07-06 LAB — URINALYSIS, W/ REFLEX TO CULTURE (INFECTION SUSPECTED)
Bilirubin Urine: NEGATIVE
Glucose, UA: NEGATIVE mg/dL
Hgb urine dipstick: NEGATIVE
Ketones, ur: NEGATIVE mg/dL
Leukocytes,Ua: NEGATIVE
Nitrite: NEGATIVE
Protein, ur: NEGATIVE mg/dL
Specific Gravity, Urine: 1.015 (ref 1.005–1.030)
pH: 5 (ref 5.0–8.0)

## 2023-07-06 LAB — CBC
HCT: 37.5 % (ref 36.0–46.0)
Hemoglobin: 11.8 g/dL — ABNORMAL LOW (ref 12.0–15.0)
MCH: 24.9 pg — ABNORMAL LOW (ref 26.0–34.0)
MCHC: 31.5 g/dL (ref 30.0–36.0)
MCV: 79.3 fL — ABNORMAL LOW (ref 80.0–100.0)
Platelets: 284 10*3/uL (ref 150–400)
RBC: 4.73 MIL/uL (ref 3.87–5.11)
RDW: 14.5 % (ref 11.5–15.5)
WBC: 9 10*3/uL (ref 4.0–10.5)
nRBC: 0 % (ref 0.0–0.2)

## 2023-07-06 LAB — BASIC METABOLIC PANEL
Anion gap: 10 (ref 5–15)
BUN: 10 mg/dL (ref 8–23)
CO2: 22 mmol/L (ref 22–32)
Calcium: 8.6 mg/dL — ABNORMAL LOW (ref 8.9–10.3)
Chloride: 108 mmol/L (ref 98–111)
Creatinine, Ser: 0.98 mg/dL (ref 0.44–1.00)
GFR, Estimated: >60 mL/min (ref >60)
Glucose, Bld: 104 mg/dL — ABNORMAL HIGH (ref 70–99)
Potassium: 3.7 mmol/L (ref 3.5–5.1)
Sodium: 140 mmol/L (ref 135–145)

## 2023-07-06 LAB — SURGICAL PCR SCREEN
MRSA, PCR: NEGATIVE
Staphylococcus aureus: NEGATIVE

## 2023-07-06 NOTE — Progress Notes (Signed)
EKG abnormal can you get her into preop cardiology risk stratification clinic thx

## 2023-07-06 NOTE — Progress Notes (Addendum)
PCP - Dr. Fleet Contras Cardiologist - Denies  PPM/ICD - Denies Device Orders - n/a Rep Notified - n/a  Chest x-ray - Denies EKG - 07/06/2023 Stress Test - Denies ECHO - Denies Cardiac Cath - Denies  Sleep Study - Denies CPAP - n/a  No DM  Last dose of GLP1 agonist- n/a GLP1 instructions: n/a  Blood Thinner Instructions: n/a Aspirin Instructions: n/a  ERAS Protcol - Yes. Clear liquids until 0430 morning of surgery PRE-SURGERY Ensure or G2- Ensure given to pt with instructions  COVID TEST- n/a   Anesthesia review: Yes. EKG review.  Patient denies shortness of breath, fever, cough and chest pain at PAT appointment. Pt denies any respiratory illness/infection in the last two months.    All instructions explained to the patient, with a verbal understanding of the material. Patient agrees to go over the instructions while at home for a better understanding. Patient also instructed to self quarantine after being tested for COVID-19. The opportunity to ask questions was provided.

## 2023-07-07 ENCOUNTER — Ambulatory Visit: Payer: Medicare PPO | Admitting: Orthopedic Surgery

## 2023-07-07 DIAGNOSIS — M65311 Trigger thumb, right thumb: Secondary | ICD-10-CM | POA: Diagnosis not present

## 2023-07-07 NOTE — Progress Notes (Addendum)
Anesthesia Chart Review:  Case: [id number] Date/Time: 07/15/23 0715   Procedures:      REVERSE SHOULDER ARTHROPLASTY (Right: Shoulder)     BICEPS TENODESIS (Right: Shoulder)   Anesthesia type: General   Pre-op diagnosis: right rotator cuff tear arthropathy, biceps tendinitis   [facility]: [facility] OR ROOM 07 / [facility] OR   Surgeons: Cammy Copa, MD       DISCUSSION: Patient is a 77 year old female scheduled for the above procedure.  History includes never smoker, HTN, hypercholesterolemia, GERD, iron deficiency anemia, chronic fatigue syndrome, vertigo, glaucoma, hiatal hernia, dysphagia, arthritis, postmenopausal bleeding (s/p D&C, resection of endometrial polyp 11/09/22). S/p EGD & colonoscopy 12/02/22. S/p left long finger trigger release 06/24/23 and is scheduled for right trigger thumb release on 07/08/23.  BMI is consistent with obesity.  She had a medical clearance letter completed by her PCP Fleet Contras, MD on 04/21/23 (scanned under Media tab).   EKG on 07/06/23 showed NSR, septal infarct. Low of r wave in V2 is new when compared to August 2023 tracing. Dr. August Saucer is requesting a preoperative cardiology risk stratification.  Chart will be left for follow-up.   ADDENDUM 07/12/23 10:38 AM: She was evaluated by cardiologist Rinaldo Cloud, MD on 07/09/23 for preoperative cardiology evaluation. He reviewed her EKG showing new septal infarct changes. She was asymptomatic. 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 He also performed in in office EKG which showed "normal sinus rhythm with the normal R-wave progression in the anteroseptal leads and there was nondiagnostic Q wave in lead 3 and aVF." He wrote, "No further cardiac testing needed at this point.  Patient is acceptable risk for right shoulder reverse arthroplasty from cardiac point of view."   VS: BP (!) 158/65   Pulse 73   Temp 36.8 C    Resp 18   Ht 5\' 1"  (1.549 m)   Wt 94.2 kg   SpO2 99%   BMI 39.24 kg/m    PROVIDERS: Fleet Contras, MD is PCP    LABS: Labs reviewed: Acceptable for surgery. (all labs ordered are listed, but only abnormal results are displayed)  Labs Reviewed  CBC - Abnormal; Notable for the following components:      Result Value   Hemoglobin 11.8 (*)    MCV 79.3 (*)    MCH 24.9 (*)    All other components within normal limits  BASIC METABOLIC PANEL - Abnormal; Notable for the following components:   Glucose, Bld 104 (*)    Calcium 8.6 (*)    All other components within normal limits  URINALYSIS, W/ REFLEX TO CULTURE (INFECTION SUSPECTED) - Abnormal; Notable for the following components:   Bacteria, UA RARE (*)    All other components within normal limits  SURGICAL PCR SCREEN  URINE CULTURE     IMAGES: CT Right Shoulder 06/02/23: IMPRESSION: 1. Progressive, now moderate glenohumeral degenerative changes. 2. Progressive moderate atrophy of the supraspinatus and infraspinatus muscles. Known rotator cuff tears and biceps tendon rupture are not well visualized by CT. 3. Stable moderate to severe acromioclavicular degenerative changes. 4. No acute osseous findings.   [place] Abd 03/17/23 ([facility]): MPRESSION:  1. Hepatic steatosis  2. 1.1 cm hypoechoic right renal lesion most likely a cyst. Suggest follow-up renal ultrasound in 6 months.  3. Incidental uterine fibroid    EKG: Normal sinus rhythm Septal infarct , age undetermined - Septal infarct (loss of  r wave in V2) is new when compared to 05/17/22 tracing.    CV: N/A  Past Medical History:  Diagnosis Date   Arthritis    Chronic fatigue syndrome    Constipation    Depression    Dysphagia    GERD (gastroesophageal reflux disease)    Glaucoma    Dr Dione Booze   High cholesterol    History of hiatal hernia    Hypertension    IDA (iron deficiency anemia)    Hx   MMT (medial meniscus tear)    right   Obesity    Vertigo      Past Surgical History:  Procedure Laterality Date   CHOLECYSTECTOMY     COLONOSCOPY  09/19/2013   Dr [facility]  The entire examined colon is normal   DILATATION & CURETTAGE/HYSTEROSCOPY WITH MYOSURE N/A 11/09/2022   Procedure: DILATATION & CURETTAGE/HYSTEROSCOPY WITH MYOSURE;  Surgeon: Richarda Overlie, MD;  [facility];  Service: Gynecology;  Laterality: N/A;   KNEE ARTHROSCOPY WITH MEDIAL MENISECTOMY Right 06/22/2018   Procedure: RIGHT KNEE ARTHROSCOPY WITH MEDIAL MENISCECTOMY, CHONDROPLASTY;  Surgeon: Tarry Kos, MD;  Location: [facility];  Service: Orthopedics;  Laterality: Right;   SHOULDER ARTHROSCOPY     removal of bone spur   TONSILLECTOMY     TUBAL LIGATION  2000    MEDICATIONS:  atorvastatin (LIPITOR) 40 MG tablet   BIOTIN PO   Cholecalciferol (VITAMIN D3 PO)   CINNAMON PO   famotidine (PEPCID) 20 MG tablet   hydrochlorothiazide (HYDRODIURIL) 25 MG tablet   losartan (COZAAR) 100 MG tablet   meclizine (ANTIVERT) 25 MG tablet   Multiple Vitamins-Minerals (HAIR SKIN & NAILS) TABS   pantoprazole (PROTONIX) 40 MG tablet   No current facility-administered medications for this encounter.    Shonna Chock, PA-C Surgical Short Stay/Anesthesiology [facility] Phone [phone] [facility] Phone [phone] 07/08/2023 4:45 PM

## 2023-07-07 NOTE — Progress Notes (Signed)
Shawna Thompson - 77 y.o. female MRN [id number]  Date of birth: Apr 27, 1946  Office Visit Note: Visit Date: 07/07/2023 PCP: Fleet Contras, MD Referred by: Fleet Contras, MD  Subjective:  HPI: Shawna Thompson is a 77 y.o. female who presents today for follow up 2 weeks status post left long finger trigger release.  She still has ongoing right thumb trigger digit release with plans for trigger thumb release tomorrow.  Pertinent ROS were reviewed with the patient and found to be negative unless otherwise specified above in HPI.   Assessment & Plan: Visit Diagnoses: No diagnosis found.  Plan: She is doing very well from the left long finger trigger digit release.  Sutures removed today.  As previously seen, she does have clinical evidence of right thumb trigger digit that is refractory conservative care.  We are planning to move forward with right thumb trigger digit release tomorrow as scheduled.  Surgery will be performed with IV sedation.  Risks and benefits of the procedure were discussed, risks including but not limited to infection, bleeding, scarring, stiffness, nerve injury, tendon injury, vascular injury, hardware complication if utilized, recurrence of symptoms and need for subsequent operation.  Patient expressed understanding.      Follow-up: No follow-ups on file.   Meds & Orders: No orders of the defined types were placed in this encounter.  No orders of the defined types were placed in this encounter.    Procedures: No procedures performed       Objective:   Vital Signs: There were no vitals taken for this visit.  Ortho Exam Left long finger incision over the A1 pulley well-healing, sutures in place, skin edges well-approximated, no erythema or drainage, able to perform range of motion of the long finger without residual clicking or locking  Right thumb Palpable nodule A1 pulley, significant tenderness, notable clicking and locking with deep flexion of  the IP  Imaging: No results found.   Shawna Thompson Trevor Mace, M.D. [facility] 9:16 AM

## 2023-07-08 ENCOUNTER — Other Ambulatory Visit: Payer: Self-pay | Admitting: Orthopedic Surgery

## 2023-07-08 LAB — URINE CULTURE: Culture: NO GROWTH

## 2023-07-08 MED ORDER — OXYCODONE HCL 5 MG PO TABS
5.0000 mg | ORAL_TABLET | Freq: Four times a day (QID) | ORAL | 0 refills | Status: DC | PRN
Start: 2023-07-08 — End: 2023-07-16

## 2023-07-12 ENCOUNTER — Ambulatory Visit: Payer: Medicare PPO | Admitting: Orthopedic Surgery

## 2023-07-12 NOTE — Anesthesia Preprocedure Evaluation (Addendum)
Anesthesia Evaluation  Patient identified by MRN, date of birth, ID band Patient awake    Reviewed: Allergy & Precautions, NPO status , Patient's Chart, lab work & pertinent test results  History of Anesthesia Complications Negative for: history of anesthetic complications  Airway Mallampati: III  TM Distance: >3 FB Neck ROM: Full    Dental  (+) Dental Advisory Given   Pulmonary neg shortness of breath, neg sleep apnea, neg COPD, neg recent URI   breath sounds clear to auscultation       Cardiovascular hypertension, Pt. on medications (-) angina (-) Past MI  Rhythm:Regular     Neuro/Psych  PSYCHIATRIC DISORDERS  Depression    negative neurological ROS     GI/Hepatic Neg liver ROS, hiatal hernia,GERD  ,,  Endo/Other  negative endocrine ROS    Renal/GU negative Renal ROS     Musculoskeletal  (+) Arthritis ,    Abdominal   Peds  Hematology  (+) Blood dyscrasia, anemia   Anesthesia Other Findings   Reproductive/Obstetrics                             Anesthesia Physical Anesthesia Plan  ASA: 2  Anesthesia Plan: General and Regional   Post-op Pain Management: Regional block*   Induction: Intravenous  PONV Risk Score and Plan: 3 and Ondansetron and Dexamethasone  Airway Management Planned: Oral ETT  Additional Equipment: None  Intra-op Plan:   Post-operative Plan: Extubation in OR  Informed Consent: I have reviewed the patients History and Physical, chart, labs and discussed the procedure including the risks, benefits and alternatives for the proposed anesthesia with the patient or authorized representative who has indicated his/her understanding and acceptance.     Dental advisory given  Plan Discussed with: CRNA  Anesthesia Plan Comments: (PAT note written by Shonna Chock, PA-C.  )       Anesthesia Quick Evaluation

## 2023-07-15 ENCOUNTER — Ambulatory Visit (HOSPITAL_COMMUNITY): Payer: Worker's Compensation | Admitting: Vascular Surgery

## 2023-07-15 ENCOUNTER — Encounter (HOSPITAL_COMMUNITY): Payer: Self-pay | Admitting: Orthopedic Surgery

## 2023-07-15 ENCOUNTER — Other Ambulatory Visit: Payer: Self-pay

## 2023-07-15 ENCOUNTER — Ambulatory Visit (HOSPITAL_BASED_OUTPATIENT_CLINIC_OR_DEPARTMENT_OTHER): Payer: Worker's Compensation

## 2023-07-15 ENCOUNTER — Observation Stay (HOSPITAL_COMMUNITY): Payer: Medicare PPO

## 2023-07-15 ENCOUNTER — Encounter (HOSPITAL_COMMUNITY)
Admission: RE | Disposition: A | Discharge status: Home / Self Care | Payer: Self-pay | Source: Ambulatory Visit | Attending: Orthopedic Surgery

## 2023-07-15 ENCOUNTER — Observation Stay (HOSPITAL_COMMUNITY)
Admission: RE | Admit: 2023-07-15 | Discharge: 2023-07-16 | Disposition: A | Discharge status: Home / Self Care | Payer: Worker's Compensation | Source: Ambulatory Visit | Attending: Orthopedic Surgery | Admitting: Orthopedic Surgery

## 2023-07-15 DIAGNOSIS — I10 Essential (primary) hypertension: Secondary | ICD-10-CM | POA: Diagnosis not present

## 2023-07-15 DIAGNOSIS — M7521 Bicipital tendinitis, right shoulder: Principal | ICD-10-CM

## 2023-07-15 DIAGNOSIS — M19011 Primary osteoarthritis, right shoulder: Secondary | ICD-10-CM

## 2023-07-15 DIAGNOSIS — Z79899 Other long term (current) drug therapy: Secondary | ICD-10-CM | POA: Insufficient documentation

## 2023-07-15 DIAGNOSIS — Z6841 Body Mass Index (BMI) 40.0 and over, adult: Secondary | ICD-10-CM

## 2023-07-15 DIAGNOSIS — M12811 Other specific arthropathies, not elsewhere classified, right shoulder: Secondary | ICD-10-CM | POA: Diagnosis not present

## 2023-07-15 DIAGNOSIS — Z01818 Encounter for other preprocedural examination: Principal | ICD-10-CM

## 2023-07-15 DIAGNOSIS — Z96611 Presence of right artificial shoulder joint: Secondary | ICD-10-CM

## 2023-07-15 HISTORY — PX: BICEPT TENODESIS: SHX5116

## 2023-07-15 HISTORY — PX: REVERSE SHOULDER ARTHROPLASTY: SHX5054

## 2023-07-15 SURGERY — ARTHROPLASTY, SHOULDER, TOTAL, REVERSE
Anesthesia: Regional | Site: Shoulder | Laterality: Right

## 2023-07-15 MED ORDER — VANCOMYCIN HCL 1000 MG IV SOLR
INTRAVENOUS | Status: AC
  Filled 2023-07-15: qty 20

## 2023-07-15 MED ORDER — LIDOCAINE 2% (20 MG/ML) 5 ML SYRINGE
INTRAMUSCULAR | Status: DC | PRN
  Administered 2023-07-15: 40 mg via INTRAVENOUS

## 2023-07-15 MED ORDER — LACTATED RINGERS IV SOLN: INTRAVENOUS | Status: DC

## 2023-07-15 MED ORDER — PROPOFOL 10 MG/ML IV BOLUS
INTRAVENOUS | Status: AC
  Filled 2023-07-15: qty 20

## 2023-07-15 MED ORDER — PROPOFOL 10 MG/ML IV BOLUS
INTRAVENOUS | Status: DC | PRN
  Administered 2023-07-15: 100 mg via INTRAVENOUS

## 2023-07-15 MED ORDER — ACETAMINOPHEN 500 MG PO TABS: 1000.0000 mg | ORAL_TABLET | Freq: Once | ORAL | Status: DC | PRN

## 2023-07-15 MED ORDER — ACETAMINOPHEN 160 MG/5ML PO SOLN: 1000.0000 mg | Freq: Once | ORAL | Status: DC | PRN

## 2023-07-15 MED ORDER — ACETAMINOPHEN 10 MG/ML IV SOLN
1000.0000 mg | Freq: Once | INTRAVENOUS | Status: DC | PRN
  Administered 2023-07-15: 1000 mg via INTRAVENOUS

## 2023-07-15 MED ORDER — HYDROMORPHONE HCL 1 MG/ML IJ SOLN: 0.5000 mg | INTRAMUSCULAR | Status: DC | PRN

## 2023-07-15 MED ORDER — ONDANSETRON HCL 4 MG/2ML IJ SOLN: 4.0000 mg | Freq: Four times a day (QID) | INTRAMUSCULAR | Status: DC | PRN

## 2023-07-15 MED ORDER — MIDAZOLAM HCL 2 MG/2ML IJ SOLN
INTRAMUSCULAR | Status: DC | PRN
  Administered 2023-07-15: 1 mg via INTRAVENOUS

## 2023-07-15 MED ORDER — ACETAMINOPHEN 500 MG PO TABS
1000.0000 mg | ORAL_TABLET | Freq: Four times a day (QID) | ORAL | Status: AC
  Administered 2023-07-15 – 2023-07-16 (×4): 1000 mg via ORAL
  Filled 2023-07-15 (×4): qty 2

## 2023-07-15 MED ORDER — IRRISEPT - 450ML BOTTLE WITH 0.05% CHG IN STERILE WATER, USP 99.95% OPTIME
TOPICAL | Status: DC | PRN
  Administered 2023-07-15: 450 mL via TOPICAL

## 2023-07-15 MED ORDER — LACTATED RINGERS IV SOLN
INTRAVENOUS | Status: DC | PRN
Start: 2023-07-15 — End: 2023-07-15

## 2023-07-15 MED ORDER — MENTHOL 3 MG MT LOZG: 1.0000 | LOZENGE | OROMUCOSAL | Status: DC | PRN

## 2023-07-15 MED ORDER — OXYCODONE HCL 5 MG PO TABS
5.0000 mg | ORAL_TABLET | ORAL | Status: DC | PRN
  Administered 2023-07-15: 5 mg via ORAL
  Administered 2023-07-16: 10 mg via ORAL
  Administered 2023-07-16: 5 mg via ORAL
  Filled 2023-07-15: qty 1
  Filled 2023-07-15: qty 2

## 2023-07-15 MED ORDER — SODIUM CHLORIDE 0.9% FLUSH
3.0000 mL | Freq: Two times a day (BID) | INTRAVENOUS | Status: DC
  Administered 2023-07-15 – 2023-07-16 (×2): 3 mL via INTRAVENOUS

## 2023-07-15 MED ORDER — OXYCODONE HCL 5 MG/5ML PO SOLN: 5.0000 mg | Freq: Once | ORAL | Status: DC | PRN

## 2023-07-15 MED ORDER — FENTANYL CITRATE (PF) 250 MCG/5ML IJ SOLN
INTRAMUSCULAR | Status: DC | PRN
  Administered 2023-07-15: 25 ug via INTRAVENOUS
  Administered 2023-07-15: 50 ug via INTRAVENOUS
  Administered 2023-07-15: 25 ug via INTRAVENOUS
  Administered 2023-07-15: 50 ug via INTRAVENOUS

## 2023-07-15 MED ORDER — EPHEDRINE SULFATE-NACL 50-0.9 MG/10ML-% IV SOSY
PREFILLED_SYRINGE | INTRAVENOUS | Status: DC | PRN
  Administered 2023-07-15: 5 mg via INTRAVENOUS
  Administered 2023-07-15: 10 mg via INTRAVENOUS

## 2023-07-15 MED ORDER — SODIUM CHLORIDE 0.9% FLUSH
3.0000 mL | Freq: Two times a day (BID) | INTRAVENOUS | Status: DC
  Administered 2023-07-15 – 2023-07-16 (×3): 3 mL via INTRAVENOUS

## 2023-07-15 MED ORDER — TRANEXAMIC ACID-NACL 1000-0.7 MG/100ML-% IV SOLN
1000.0000 mg | INTRAVENOUS | Status: AC
  Administered 2023-07-15: 1000 mg via INTRAVENOUS
  Filled 2023-07-15: qty 100

## 2023-07-15 MED ORDER — ORAL CARE MOUTH RINSE: 15.0000 mL | Freq: Once | OROMUCOSAL | Status: AC

## 2023-07-15 MED ORDER — PHENYLEPHRINE 80 MCG/ML (10ML) SYRINGE FOR IV PUSH (FOR BLOOD PRESSURE SUPPORT)
PREFILLED_SYRINGE | INTRAVENOUS | Status: AC
  Filled 2023-07-15: qty 10

## 2023-07-15 MED ORDER — METOCLOPRAMIDE HCL 5 MG/ML IJ SOLN: 5.0000 mg | Freq: Three times a day (TID) | INTRAMUSCULAR | Status: DC | PRN

## 2023-07-15 MED ORDER — ACETAMINOPHEN 325 MG PO TABS: 325.0000 mg | ORAL_TABLET | Freq: Four times a day (QID) | ORAL | Status: DC | PRN

## 2023-07-15 MED ORDER — VANCOMYCIN HCL 1000 MG IV SOLR
INTRAVENOUS | Status: DC | PRN
  Administered 2023-07-15: 1000 mg via TOPICAL

## 2023-07-15 MED ORDER — FAMOTIDINE 20 MG PO TABS
20.0000 mg | ORAL_TABLET | Freq: Every day | ORAL | Status: DC
  Administered 2023-07-15 – 2023-07-16 (×2): 20 mg via ORAL
  Filled 2023-07-15 (×2): qty 1

## 2023-07-15 MED ORDER — FENTANYL CITRATE (PF) 100 MCG/2ML IJ SOLN
INTRAMUSCULAR | Status: AC
  Filled 2023-07-15: qty 2

## 2023-07-15 MED ORDER — ACETAMINOPHEN 10 MG/ML IV SOLN
INTRAVENOUS | Status: AC
  Filled 2023-07-15: qty 100

## 2023-07-15 MED ORDER — DEXAMETHASONE SODIUM PHOSPHATE 10 MG/ML IJ SOLN
INTRAMUSCULAR | Status: DC | PRN
  Administered 2023-07-15: 8 mg via INTRAVENOUS

## 2023-07-15 MED ORDER — ROCURONIUM BROMIDE 10 MG/ML (PF) SYRINGE
PREFILLED_SYRINGE | INTRAVENOUS | Status: AC
  Filled 2023-07-15: qty 10

## 2023-07-15 MED ORDER — CELECOXIB 100 MG PO CAPS
100.0000 mg | ORAL_CAPSULE | Freq: Two times a day (BID) | ORAL | Status: DC
  Administered 2023-07-15: 100 mg via ORAL
  Filled 2023-07-15 (×2): qty 1

## 2023-07-15 MED ORDER — MIDAZOLAM HCL 2 MG/2ML IJ SOLN
INTRAMUSCULAR | Status: AC
  Filled 2023-07-15: qty 2

## 2023-07-15 MED ORDER — LOSARTAN POTASSIUM 50 MG PO TABS
100.0000 mg | ORAL_TABLET | Freq: Every day | ORAL | Status: DC
  Administered 2023-07-16: 100 mg via ORAL
  Filled 2023-07-15: qty 2

## 2023-07-15 MED ORDER — SUGAMMADEX SODIUM 200 MG/2ML IV SOLN
INTRAVENOUS | Status: DC | PRN
  Administered 2023-07-15: 200 mg via INTRAVENOUS

## 2023-07-15 MED ORDER — DEXAMETHASONE SODIUM PHOSPHATE 10 MG/ML IJ SOLN
INTRAMUSCULAR | Status: AC
  Filled 2023-07-15: qty 1

## 2023-07-15 MED ORDER — METHOCARBAMOL 1000 MG/10ML IJ SOLN: 500.0000 mg | Freq: Four times a day (QID) | INTRAVENOUS | Status: DC | PRN

## 2023-07-15 MED ORDER — FENTANYL CITRATE (PF) 100 MCG/2ML IJ SOLN
25.0000 ug | INTRAMUSCULAR | Status: DC | PRN
  Administered 2023-07-15: 50 ug via INTRAVENOUS

## 2023-07-15 MED ORDER — ASPIRIN 81 MG PO TBEC
81.0000 mg | DELAYED_RELEASE_TABLET | Freq: Every day | ORAL | Status: DC
  Administered 2023-07-15 – 2023-07-16 (×2): 81 mg via ORAL
  Filled 2023-07-15 (×2): qty 1

## 2023-07-15 MED ORDER — CEFAZOLIN SODIUM-DEXTROSE 2-4 GM/100ML-% IV SOLN
2.0000 g | INTRAVENOUS | Status: AC
  Administered 2023-07-15: 2 g via INTRAVENOUS
  Filled 2023-07-15: qty 100

## 2023-07-15 MED ORDER — 0.9 % SODIUM CHLORIDE (POUR BTL) OPTIME
TOPICAL | Status: DC | PRN
  Administered 2023-07-15 (×4): 1000 mL

## 2023-07-15 MED ORDER — ONDANSETRON HCL 4 MG/2ML IJ SOLN
INTRAMUSCULAR | Status: DC | PRN
  Administered 2023-07-15: 4 mg via INTRAVENOUS

## 2023-07-15 MED ORDER — HYDROCHLOROTHIAZIDE 25 MG PO TABS
25.0000 mg | ORAL_TABLET | Freq: Every day | ORAL | Status: DC
  Administered 2023-07-16: 25 mg via ORAL
  Filled 2023-07-15: qty 1

## 2023-07-15 MED ORDER — CHLORHEXIDINE GLUCONATE 0.12 % MT SOLN
15.0000 mL | Freq: Once | OROMUCOSAL | Status: AC
  Administered 2023-07-15: 15 mL via OROMUCOSAL
  Filled 2023-07-15: qty 15

## 2023-07-15 MED ORDER — METHOCARBAMOL 500 MG PO TABS: 500.0000 mg | ORAL_TABLET | Freq: Four times a day (QID) | ORAL | Status: DC | PRN

## 2023-07-15 MED ORDER — ONDANSETRON HCL 4 MG/2ML IJ SOLN
INTRAMUSCULAR | Status: AC
  Filled 2023-07-15: qty 2

## 2023-07-15 MED ORDER — METOCLOPRAMIDE HCL 5 MG PO TABS: 5.0000 mg | ORAL_TABLET | Freq: Three times a day (TID) | ORAL | Status: DC | PRN

## 2023-07-15 MED ORDER — POVIDONE-IODINE 7.5 % EX SOLN
Freq: Once | CUTANEOUS | Status: DC
  Filled 2023-07-15: qty 118

## 2023-07-15 MED ORDER — PHENOL 1.4 % MT LIQD: 1.0000 | OROMUCOSAL | Status: DC | PRN

## 2023-07-15 MED ORDER — DOCUSATE SODIUM 100 MG PO CAPS
100.0000 mg | ORAL_CAPSULE | Freq: Two times a day (BID) | ORAL | Status: DC
  Administered 2023-07-15 – 2023-07-16 (×2): 100 mg via ORAL
  Filled 2023-07-15 (×2): qty 1

## 2023-07-15 MED ORDER — OXYCODONE HCL 5 MG PO TABS
ORAL_TABLET | ORAL | Status: AC
  Filled 2023-07-15: qty 1

## 2023-07-15 MED ORDER — LIDOCAINE 2% (20 MG/ML) 5 ML SYRINGE
INTRAMUSCULAR | Status: AC
  Filled 2023-07-15: qty 5

## 2023-07-15 MED ORDER — CEFAZOLIN SODIUM-DEXTROSE 2-4 GM/100ML-% IV SOLN
2.0000 g | Freq: Three times a day (TID) | INTRAVENOUS | Status: AC
  Administered 2023-07-15 – 2023-07-16 (×3): 2 g via INTRAVENOUS
  Filled 2023-07-15 (×3): qty 100

## 2023-07-15 MED ORDER — EPHEDRINE 5 MG/ML INJ
INTRAVENOUS | Status: AC
  Filled 2023-07-15: qty 5

## 2023-07-15 MED ORDER — ONDANSETRON HCL 4 MG PO TABS: 4.0000 mg | ORAL_TABLET | Freq: Four times a day (QID) | ORAL | Status: DC | PRN

## 2023-07-15 MED ORDER — PHENYLEPHRINE HCL-NACL 20-0.9 MG/250ML-% IV SOLN
INTRAVENOUS | Status: DC | PRN
  Administered 2023-07-15: 30 ug/min via INTRAVENOUS

## 2023-07-15 MED ORDER — ROCURONIUM BROMIDE 10 MG/ML (PF) SYRINGE
PREFILLED_SYRINGE | INTRAVENOUS | Status: DC | PRN
  Administered 2023-07-15: 50 mg via INTRAVENOUS

## 2023-07-15 MED ORDER — FENTANYL CITRATE (PF) 250 MCG/5ML IJ SOLN
INTRAMUSCULAR | Status: AC
  Filled 2023-07-15: qty 5

## 2023-07-15 MED ORDER — OXYCODONE HCL 5 MG PO TABS: 5.0000 mg | ORAL_TABLET | Freq: Once | ORAL | Status: DC | PRN

## 2023-07-15 MED ORDER — PHENYLEPHRINE 80 MCG/ML (10ML) SYRINGE FOR IV PUSH (FOR BLOOD PRESSURE SUPPORT)
PREFILLED_SYRINGE | INTRAVENOUS | Status: DC | PRN
  Administered 2023-07-15: 80 ug via INTRAVENOUS
  Administered 2023-07-15: 160 ug via INTRAVENOUS

## 2023-07-15 MED ORDER — PANTOPRAZOLE SODIUM 40 MG PO TBEC
40.0000 mg | DELAYED_RELEASE_TABLET | Freq: Every day | ORAL | Status: DC
  Administered 2023-07-16: 40 mg via ORAL
  Filled 2023-07-15: qty 1

## 2023-07-15 MED ORDER — SUCCINYLCHOLINE CHLORIDE 200 MG/10ML IV SOSY
PREFILLED_SYRINGE | INTRAVENOUS | Status: AC
  Filled 2023-07-15: qty 10

## 2023-07-15 MED ORDER — POVIDONE-IODINE 10 % EX SWAB
2.0000 | Freq: Once | CUTANEOUS | Status: AC
  Administered 2023-07-15: 2 via TOPICAL

## 2023-07-15 SURGICAL SUPPLY — 77 items
AID PSTN UNV HD RSTRNT DISP (MISCELLANEOUS) ×1
ALCOHOL 70% 16 OZ (MISCELLANEOUS) ×1 IMPLANT
APL PRP STRL LF DISP 70% ISPRP (MISCELLANEOUS) ×1
AUG COMP REV MI TAPER ADAPTER (Joint) ×1 IMPLANT
AUGMENT COMP REV MI TAPR ADPTR (Joint) IMPLANT
BAG COUNTER SPONGE SURGICOUNT (BAG) ×1 IMPLANT
BAG SPNG CNTER NS LX DISP (BAG) ×1
BEARING HUMERAL SHLDER 36M STD (Shoulder) IMPLANT
BIT DRILL 1.5X85 (BIT) IMPLANT
BIT DRILL 2.7 W/STOP DISP (BIT) IMPLANT
BIT DRILL TWIST 2.7 (BIT) IMPLANT
BLADE SAW SGTL 13X75X1.27 (BLADE) ×1 IMPLANT
BRNG HUM STD 36 RVRS SHLDR (Shoulder) ×1 IMPLANT
BSPLAT GLND SM AUG TPR ADPR (Joint) ×1 IMPLANT
CHLORAPREP W/TINT 26 (MISCELLANEOUS) ×1 IMPLANT
COOLER ICEMAN CLASSIC (MISCELLANEOUS) ×1 IMPLANT
COVER SURGICAL LIGHT HANDLE (MISCELLANEOUS) ×1 IMPLANT
DRAPE INCISE IOBAN 66X45 STRL (DRAPES) ×1 IMPLANT
DRAPE U-SHAPE 47X51 STRL (DRAPES) ×2 IMPLANT
DRSG AQUACEL AG ADV 3.5X10 (GAUZE/BANDAGES/DRESSINGS) ×1 IMPLANT
ELECT BLADE 4.0 EZ CLEAN MEGAD (MISCELLANEOUS) ×1
ELECT REM PT RETURN 9FT ADLT (ELECTROSURGICAL) ×1
ELECTRODE BLDE 4.0 EZ CLN MEGD (MISCELLANEOUS) ×1 IMPLANT
ELECTRODE REM PT RTRN 9FT ADLT (ELECTROSURGICAL) ×1 IMPLANT
GAUZE SPONGE 4X4 12PLY STRL LF (GAUZE/BANDAGES/DRESSINGS) ×1 IMPLANT
GLENOID SPHERE 36+6 (Joint) IMPLANT
GLOVE BIOGEL PI IND STRL 6.5 (GLOVE) ×1 IMPLANT
GLOVE BIOGEL PI IND STRL 8 (GLOVE) ×1 IMPLANT
GLOVE ECLIPSE 6.5 STRL STRAW (GLOVE) ×1 IMPLANT
GLOVE ECLIPSE 8.0 STRL XLNG CF (GLOVE) ×1 IMPLANT
GOWN STRL REUS W/ TWL LRG LVL3 (GOWN DISPOSABLE) ×1 IMPLANT
GOWN STRL REUS W/ TWL XL LVL3 (GOWN DISPOSABLE) ×1 IMPLANT
GOWN STRL REUS W/TWL LRG LVL3 (GOWN DISPOSABLE) ×1
GOWN STRL REUS W/TWL XL LVL3 (GOWN DISPOSABLE) ×1
GUIDE BONE RSA SHLD ROT RT (ORTHOPEDIC DISPOSABLE SUPPLIES) IMPLANT
HYDROGEN PEROXIDE 16OZ (MISCELLANEOUS) ×1 IMPLANT
JET LAVAGE IRRISEPT WOUND (IRRIGATION / IRRIGATOR) ×1
KIT BASIN OR (CUSTOM PROCEDURE TRAY) ×1 IMPLANT
KIT TURNOVER KIT B (KITS) ×1 IMPLANT
LAVAGE JET IRRISEPT WOUND (IRRIGATION / IRRIGATOR) ×1 IMPLANT
MANIFOLD NEPTUNE II (INSTRUMENTS) ×1 IMPLANT
NDL 1/2 CIR MAYO (NEEDLE) IMPLANT
NDL SUT 6 .5 CRC .975X.05 MAYO (NEEDLE) IMPLANT
NEEDLE 1/2 CIR MAYO (NEEDLE) ×1 IMPLANT
NEEDLE MAYO TAPER (NEEDLE) ×1
NS IRRIG 1000ML POUR BTL (IV SOLUTION) ×1 IMPLANT
PACK SHOULDER (CUSTOM PROCEDURE TRAY) ×1 IMPLANT
PAD COLD SHLDR WRAP-ON (PAD) ×1 IMPLANT
PIN STEINMANN THREADED TIP (PIN) IMPLANT
PIN THREADED REVERSE (PIN) IMPLANT
REAMER GUIDE BUSHING SURG DISP (MISCELLANEOUS) IMPLANT
RESTRAINT HEAD UNIVERSAL NS (MISCELLANEOUS) ×1 IMPLANT
RETRIEVER SUT HEWSON (MISCELLANEOUS) ×1 IMPLANT
SCREW BONE STRL 6.5MMX25MM (Screw) IMPLANT
SCREW LOCKING 4.75MMX15MM (Screw) IMPLANT
SCREW LOCKING STRL 4.75X25X3.5 (Screw) IMPLANT
SHOULDER HUMERAL BEAR 36M STD (Shoulder) ×1 IMPLANT
SLING ARM IMMOBILIZER LRG (SOFTGOODS) ×1 IMPLANT
SOL PREP POV-IOD 4OZ 10% (MISCELLANEOUS) ×1 IMPLANT
SPONGE T-LAP 18X18 ~~LOC~~+RFID (SPONGE) ×1 IMPLANT
STEM SHOULDER (Stem) IMPLANT
STRIP CLOSURE SKIN 1/2X4 (GAUZE/BANDAGES/DRESSINGS) ×1 IMPLANT
SUCTION TUBE FRAZIER 10FR DISP (SUCTIONS) ×1 IMPLANT
SUT BROADBAND TAPE 2PK 1.5 (SUTURE) IMPLANT
SUT MNCRL AB 3-0 PS2 18 (SUTURE) ×1 IMPLANT
SUT MNCRL AB 3-0 PS2 27 (SUTURE) IMPLANT
SUT SILK 2 0 TIES 10X30 (SUTURE) ×1 IMPLANT
SUT VIC AB 0 CT1 27 (SUTURE) ×4
SUT VIC AB 0 CT1 27XBRD ANBCTR (SUTURE) ×4 IMPLANT
SUT VIC AB 1 CT1 27 (SUTURE) ×3
SUT VIC AB 1 CT1 27XBRD ANBCTR (SUTURE) ×2 IMPLANT
SUT VIC AB 2-0 CT1 27 (SUTURE) ×3
SUT VIC AB 2-0 CT1 TAPERPNT 27 (SUTURE) ×3 IMPLANT
SUT VICRYL 0 UR6 27IN ABS (SUTURE) ×2 IMPLANT
TOWEL GREEN STERILE (TOWEL DISPOSABLE) ×1 IMPLANT
TRAY HUM REV SHOULDER STD +6 (Shoulder) IMPLANT
WATER STERILE IRR 1000ML POUR (IV SOLUTION) ×1 IMPLANT

## 2023-07-15 NOTE — Evaluation (Addendum)
Occupational Therapy Evaluation Patient Details Name: Shawna Thompson MRN: [id number] DOB: Aug 21, 1946 Today's Date: 07/15/2023   History of Present Illness 77 year old female s/p reverse shoudler arthroplasty, biceps tenodesis. History includes never smoker, HTN, hypercholesterolemia, GERD, iron deficiency anemia, chronic fatigue syndrome, vertigo, glaucoma, hiatal hernia, dysphagia, arthritis, postmenopausal bleeding (s/p D&C, resection of endometrial polyp 11/09/22). S/p EGD & colonoscopy 12/02/22. S/p left long finger trigger release 06/24/23 and is scheduled for right trigger thumb release on 07/08/23.  BMI is consistent with obesity.   Clinical Impression   Pt s/p above diagnosis. Pt c/o minimal pain, still has numbness to RUE at posterior thumb, medical/posterior forearm/elbow. Pt lives at home with children, only available before/after work and school, lives in single level home, PLOF independent. Pt and daughter educated on precautions, NWB restrictions to RUE, HEP, sling wear schedule, how to don/doff sling safely, both verbalized and demonstrated understanding.  Pt currently requires min-mod A for ADLs and transfers, was dizzy upon standing and ambulating up to 10 feet with LOB, min A to recover balance. Pt will not have support at home during the day, would benefit from [facility] and tub bench to maximize safety and independence. Pt had surgery earlier today, balance deficits and numbness to RUE may resolve by tomorrow afternoon, will reassess needs tomorrow and will follow acutely. DC plan to follow doctors recommendations for therapy.       If plan is discharge home, recommend the following: A little help with walking and/or transfers;A little help with bathing/dressing/bathroom;Assistance with cooking/housework;Assist for transportation;Help with stairs or ramp for entrance    Functional Status Assessment  Patient has had a recent decline in their functional status and demonstrates the  ability to make significant improvements in function in a reasonable and predictable amount of time.  Equipment Recommendations  BSC/3in1;Tub/shower bench    Recommendations for Other Services       Precautions / Restrictions Precautions Precautions: Shoulder;Fall;Other (comment) (history of vertigo) Type of Shoulder Precautions: Revers total shoulder Shoulder Interventions: Shoulder sling/immobilizer;At all times Precaution Booklet Issued: Yes (comment) Precaution Comments: 0-30 ER, no AROM, can do pendulums Required Braces or Orthoses: Sling Restrictions Weight Bearing Restrictions: Yes RUE Weight Bearing: Non weight bearing      Mobility Bed Mobility Overal bed mobility: Needs Assistance             General bed mobility comments: arrived in recliner    Transfers Overall transfer level: Needs assistance Equipment used: 1 person hand held assist Transfers: Sit to/from Stand, Bed to chair/wheelchair/BSC Sit to Stand: Contact guard assist     Step pivot transfers: Min assist     General transfer comment: CGA for STS, min A for balance recovery with ambulation      Balance Overall balance assessment: Needs assistance Sitting-balance support: No upper extremity supported, Feet supported Sitting balance-Leahy Scale: Good Sitting balance - Comments: recliner   Standing balance support: Single extremity supported, During functional activity Standing balance-Leahy Scale: Fair Standing balance comment: fair balance but has hx of vertigo, got dizzy and requires min A for balance recovery                           ADL either performed or assessed with clinical judgement   ADL Overall ADL's : Needs assistance/impaired Eating/Feeding: Set up;Sitting   Grooming: Set up;Sitting   Upper Body Bathing: Moderate assistance;Sitting   Lower Body Bathing: Moderate assistance;Sitting/lateral leans   Upper Body Dressing : Moderate assistance;Sitting  Lower  Body Dressing: Moderate assistance;Sit to/from stand   Toilet Transfer: Minimal assistance;Ambulation;Regular Toilet   Toileting- Clothing Manipulation and Hygiene: Minimal assistance;Sitting/lateral lean       Functional mobility during ADLs: Minimal assistance General ADL Comments: min A for recovering balance, LOB with standing/ambulation. Pt requires min-mod A for ADLs due to NWB RUE.     Vision Baseline Vision/History: 1 Wears glasses Ability to See in Adequate Light: 0 Adequate Patient Visual Report: No change from baseline       Perception         Praxis         Pertinent Vitals/Pain Pain Assessment Pain Assessment: Faces Faces Pain Scale: Hurts a little bit Pain Location: R shoulder Pain Descriptors / Indicators: Aching Pain Intervention(s): Monitored during session     Extremity/Trunk Assessment Upper Extremity Assessment Upper Extremity Assessment: LUE deficits/detail;RUE deficits/detail RUE Deficits / Details: Reverse total shoulder, nerve blco kmay still be causing numbness in RUE. history of trigger fingers in B hands, surgically resolved in the last month RUE: Shoulder pain at rest RUE Sensation: decreased light touch RUE Coordination: decreased gross motor;decreased fine motor LUE Deficits / Details: history of trigger fingers, surgically resolved in the last month LUE Sensation: WNL LUE Coordination: decreased fine motor           Communication Communication Communication: No apparent difficulties   Cognition Arousal: Alert Behavior During Therapy: WFL for tasks assessed/performed Overall Cognitive Status: Within Functional Limits for tasks assessed                                       General Comments       Exercises     Shoulder Instructions      Home Living Family/patient expects to be discharged to:: Private residence Living Arrangements: Children Available Help at Discharge: Available PRN/intermittently Type of  Home: House Home Access: Level entry     Home Layout: One level     Bathroom Shower/Tub: Tub/shower unit         Home Equipment: Shower seat;Crutches;Grab bars - tub/shower   Additional Comments: Pt lives with children and great grandchildren, available for most of the day      Prior Functioning/Environment Prior Level of Function : Independent/Modified Independent                        OT Problem List: Decreased range of motion;Decreased strength;Impaired balance (sitting and/or standing);Impaired UE functional use;Pain      OT Treatment/Interventions: Self-care/ADL training;Therapeutic exercise;Neuromuscular education;Energy conservation;DME and/or AE instruction;Manual therapy;Therapeutic activities;Patient/family education    OT Goals(Current goals can be found in the care plan section) Acute Rehab OT Goals Patient Stated Goal: to return home OT Goal Formulation: With patient/family Time For Goal Achievement: 07/29/23 Potential to Achieve Goals: Good  OT Frequency: Min 1X/week    Co-evaluation              AM-PAC OT "6 Clicks" Daily Activity     Outcome Measure Help from another person eating meals?: A Little Help from another person taking care of personal grooming?: A Little Help from another person toileting, which includes using toliet, bedpan, or urinal?: A Little Help from another person bathing (including washing, rinsing, drying)?: A Lot Help from another person to put on and taking off regular upper body clothing?: A Little Help from another person to put on and  taking off regular lower body clothing?: A Lot 6 Click Score: 16   End of Session Equipment Utilized During Treatment: Gait belt Nurse Communication: Mobility status  Activity Tolerance: Patient tolerated treatment well Patient left: in chair;with call bell/phone within reach;with family/visitor present  OT Visit Diagnosis: Unsteadiness on feet (R26.81);Other abnormalities of  gait and mobility (R26.89);Muscle weakness (generalized) (M62.81);Pain Pain - Right/Left: Right Pain - part of body: Shoulder                Time: 7829-5621 OT Time Calculation (min): 36 min Charges:  OT General Charges $OT Visit: 1 Visit OT Evaluation $OT Eval Low Complexity: 1 Low OT Treatments $Self Care/Home Management : 8-22 mins  [address], OTR/L   Alexis Goodell 07/15/2023, 5:07 PM

## 2023-07-15 NOTE — Brief Op Note (Signed)
   07/15/2023  10:59 AM  PATIENT:  Shawna Thompson  77 y.o. female  PRE-OPERATIVE DIAGNOSIS:  right rotator cuff tear arthropathy, biceps tendinitis  POST-OPERATIVE DIAGNOSIS:  right rotator cuff tear arthropathy, biceps tendinitis  PROCEDURE:  Procedure(s): REVERSE SHOULDER ARTHROPLASTY BICEPS TENODESIS  SURGEON:  Surgeon(s): Cammy Copa, MD  ASSISTANT: magnant pa  ANESTHESIA:   general  EBL: 75 ml    Total I/O In: 700 [I.V.:600; IV Piggyback:100] Out: 75 [Blood:75]  BLOOD ADMINISTERED: none  DRAINS: none   LOCAL MEDICATIONS USED:  vanco powder  SPECIMEN:  No Specimen  COUNTS:  YES  TOURNIQUET:  * No tourniquets in log *  DICTATION: .Other Dictation: Dictation Number [id number]   PLAN OF CARE: Admit for overnight observation  PATIENT DISPOSITION:  PACU - hemodynamically stable

## 2023-07-15 NOTE — Anesthesia Procedure Notes (Signed)
Procedure Name: Intubation Date/Time: 07/15/2023 8:46 AM  Performed by: April Holding, CRNAPre-anesthesia Checklist: Patient identified, Emergency Drugs available, Suction available and Patient being monitored Patient Re-evaluated:Patient Re-evaluated prior to induction Oxygen Delivery Method: Circle System Utilized Preoxygenation: Pre-oxygenation with 100% oxygen Induction Type: IV induction Ventilation: Mask ventilation without difficulty Laryngoscope Size: Miller and 2 Grade View: Grade I Tube type: Oral Tube size: 7.0 mm Number of attempts: 1 Airway Equipment and Method: Stylet and Oral airway Placement Confirmation: ETT inserted through vocal cords under direct vision, positive ETCO2 and breath sounds checked- equal and bilateral Secured at: 21 cm Tube secured with: Tape Dental Injury: Teeth and Oropharynx as per pre-operative assessment

## 2023-07-15 NOTE — Op Note (Signed)
NAMEORETTA, LAINE MEDICAL RECORD NO: [id number] ACCOUNT NO: [id number] DATE OF BIRTH: 07/05/1946 FACILITY: [facility] LOCATION: [facility] PHYSICIAN: Graylin Shiver. August Saucer, MD  Operative Report   DATE OF PROCEDURE: 07/15/2023  PREOPERATIVE DIAGNOSES:  Right shoulder rotator cuff arthropathy and biceps tendinitis.  POSTOPERATIVE DIAGNOSES:  Right shoulder rotator cuff arthropathy and biceps tendinitis.  PROCEDURE:  Right shoulder reverse shoulder replacement and biceps tenodesis, implants utilized include small augmented baseplate with 1 central compression screw 25 mm and 4 peripheral locking screws with 36+6 glenosphere and a mini humeral stem 10 x 83  mm with mini humeral tray +6 offset and standard 36 bearing liner.  SURGEON:  Graylin Shiver. August Saucer, MD  ASSISTANT:  Karenann Cai.  INDICATIONS:  This is a 77 year old patient with right shoulder pain, who presents with right shoulder pain, stiffness and weakness.  He has retracted rotator cuff tear and rotator cuff arthropathy.  Presents for operative management after explanation of  risks and benefits.  PROCEDURE IN DETAIL:  The patient was brought to the operating room where general endotracheal anesthesia was induced.  Preoperative antibiotics administered.  Timeout was called. The patient was placed in the beach chair position with head in neutral  position.  Right shoulder, arm and hand then pre-scrubbed with hydrogen peroxide followed by alcohol and then Betadine, which was allowed to air dry and then ChloraPrep solution and draped in sterile manner.  Ioban used to seal the entire operative  field.  Timeout was called.  Deltopectoral approach was made.  Skin and subcutaneous tissue were sharply divided.  IrriSept solution utilized.  Cephalic vein mobilized laterally.  Crossing veins were ligated.  Subdeltoid and subacromial adhesions were  released manually.  Deltoid elevated manually off its anterior attachment.  At this time, the pectoralis  tendon was released about 1.5 cm.  Next, the axillary nerve was palpated and protected at all times during the case.  The patient had irreparable  posterior superior rotator cuff tear.  The circumflex vessels were ligated.  Subscap was then detached from the lesser tuberosity after placing 2 traction 0 Vicryl sutures and this was detached from the lesser tuberosity using 15 scalpel. A complete  detachment of the capsule was then completed using electrocautery extending down about 2 cm on the proximal humeral neck.  Cobb elevator was then used to elevate the sleeve of tissue around to the 5 o'clock position on the right shoulder.  Significant  arthritis was present in both the humeral head and glenoid.  At this time, the biceps tendon was noted to be free within this elevated sleeve of tissue.  Tenodesis was then performed to the remaining pec tendon as well as soft tissue.  This was done with  5-0 Vicryl sutures.  Next, the head was dislocated.  A Browne retractor was placed.  Proximal reaming was then performed up to a size 10.  Head was then cut in 30 degrees of retroversion, which was comparable to its native version.  A good amount of the  lesser tuberosity remained in place for later reattachment of the subscap.  Broaching then performed up to a size 10, which gave a very nice fit.  The patient had excellent bone quality in the proximal humeral head region.  At this time, cap was placed  and a posterior and anterior retractors were placed.  Labral removal was performed with care being taken to avoid injury to the axillary nerve.  No muscle relaxation in place at this time. Following exposure of the  glenoid, the guide was placed and 2  pins were placed.  Reaming was then performed in accordance with preoperative templating to 5-1/2 mm ream depth.  The reaming for the augment was then performed.  The trial was then placed and very good contact was achieved.  The true implant was then  placed in the very  high quality bone with 1 central compression screw, which achieved very good fixation and 4 peripheral locking screws.  At this time, trial reduction was performed and maximum optimal stability was achieved with a +6 glenosphere as  well as a +6 offset humeral tray with standard liner.  This construct gave excellent stability to adduction and forward flexion and extension force.  As well as with internal and external rotation at 90 degrees of abduction with no impingement.  Trial  components were removed at this time. The true glenosphere was placed with neutral alignment.  Next, the humeral canal was irrigated with IrriSept solution and that was sucked out and vancomycin powder placed. We did place 6 suture tapes into the lesser  tuberosity for later reattachment of the subscap. The humeral stem was then tapped into position with very good press fit obtained.  Repeat trial reduction was performed and same stability parameters were maintained with the +6 tray and the standard  liner.  This was tapped into the dried Morse taper and the patient had difficulty with reduction and difficulty with relocation, which was optimal.  Good stability was achieved.  A thorough irrigation was performed with 2 liters of irrigating solution  followed by IrriSept solution and vancomycin powder placed on the prosthesis.  Subscap then repaired to the lesser tuberosity with the arm in 30 degrees of external rotation using the 6 suture tapes tied in NICE knots.  Axillary nerve again palpated and  found to be intact.  Deltopectoral interval closed using #1 Vicryl suture with the maintenance of the cephalic vein.  This was followed by interrupted inverted 0 Vicryl suture, 2-0 Vicryl suture, and 3-0 Monocryl with Steri-Strips, Aquacel dressing  applied, followed by shoulder immobilizer.  The patient tolerated the procedure well without immediate complications, transferred to the recovery room in stable condition.  Luke's assistance  was required at all times for retraction, opening, closing,  mobilization of tissue.  His assistance was a medical necessity.   PUS D: 07/15/2023 11:06:17 am T: 07/15/2023 12:12:00 pm  JOB: [id number]/ [id number]

## 2023-07-15 NOTE — Transfer of Care (Signed)
Immediate Anesthesia Transfer of Care Note  Patient: Temple Ewart Scharfenberg  Procedure(s) Performed: REVERSE SHOULDER ARTHROPLASTY (Right: Shoulder) BICEPS TENODESIS (Right: Shoulder)  Patient Location: PACU  Anesthesia Type:General and Regional  Level of Consciousness: drowsy  Airway & Oxygen Therapy: Patient Spontanous Breathing and Patient connected to face mask oxygen  Post-op Assessment: Report given to RN and Post -op Vital signs reviewed and stable  Post vital signs: Reviewed and stable  Last Vitals:  Vitals Value Taken Time  BP 132/64 07/15/23 1110  Temp    Pulse 99 07/15/23 1113  Resp 14 07/15/23 1113  SpO2 95 % 07/15/23 1113  Vitals shown include unfiled device data.  Last Pain:  Vitals:   07/15/23 0613  TempSrc:   PainSc: 0-No pain         Complications: No notable events documented.

## 2023-07-15 NOTE — H&P (Addendum)
Shawna Thompson is an 77 y.o. female.   Chief Complaint: Right shoulder pain HPI: Shawna Thompson is a 77 y.o. female who presents  reporting right shoulder pain since a fall in October 2023.  Pain has worsened over time.  She reports decreased range of motion and weakness.  Hard for her to lay on that right-hand side without pain.  The pain does wake her up from sleep at night.  Pain localizes to the superior anterior and lateral aspect of the shoulder with occasional radiation to the elbow but not below the elbow.  She has had several occasions of pain radiating into digits 2 and 3.  For her job she has to write on the windows of cars.  She does report weakness with that endeavor.  She cannot do her hair.  Hard for her to push herself up out of a chair.  Cortisone injection did help.  She has tried doing her own physical therapy without much improvement.  She states that it "zaps her energy to use her arm".  She does have a daughter at home.  MRI scan shows full-thickness tears with atrophy of the infraspinatus and supraspinatus with some severe subscap tendinosis and complete tear of the biceps tendon.  There is moderate glenohumeral joint and severe AC joint arthritis..    Past Medical History:  Diagnosis Date   Arthritis    Chronic fatigue syndrome    Constipation    Depression    Dysphagia    GERD (gastroesophageal reflux disease)    Glaucoma    Dr Dione Booze   High cholesterol    History of hiatal hernia    Hypertension    IDA (iron deficiency anemia)    Hx   MMT (medial meniscus tear)    right   Obesity    Vertigo     Past Surgical History:  Procedure Laterality Date   CHOLECYSTECTOMY     COLONOSCOPY  09/19/2013   Dr [facility]  The entire examined colon is normal   DILATATION & CURETTAGE/HYSTEROSCOPY WITH MYOSURE N/A 11/09/2022   Procedure: DILATATION & CURETTAGE/HYSTEROSCOPY WITH MYOSURE;  Surgeon: Richarda Overlie, MD;  Location: [facility];  Service:  Gynecology;  Laterality: N/A;   KNEE ARTHROSCOPY WITH MEDIAL MENISECTOMY Right 06/22/2018   Procedure: RIGHT KNEE ARTHROSCOPY WITH MEDIAL MENISCECTOMY, CHONDROPLASTY;  Surgeon: Tarry Kos, MD;  Location: [facility];  Service: Orthopedics;  Laterality: Right;   SHOULDER ARTHROSCOPY     removal of bone spur   TONSILLECTOMY     TUBAL LIGATION  2000    Family History  Problem Relation Age of Onset   Diabetes Mother    Heart failure Mother    Heart failure Father    Colon cancer Neg Hx    Rectal cancer Neg Hx    Stomach cancer Neg Hx    Esophageal cancer Neg Hx    Liver cancer Neg Hx    Pancreatic cancer Neg Hx    Social History:  reports that she has never smoked. She has never used smokeless tobacco. She reports that she does not currently use alcohol. She reports that she does not use drugs.  Allergies: No Known Allergies  Medications Prior to Admission  Medication Sig Dispense Refill   BIOTIN PO Take 1 tablet by mouth every other day.     Cholecalciferol (VITAMIN D3 PO) Take 1 tablet by mouth daily.     CINNAMON PO Take 1 capsule by mouth daily.  famotidine (PEPCID) 20 MG tablet Take 20 mg by mouth daily.     hydrochlorothiazide (HYDRODIURIL) 25 MG tablet Take 25 mg by mouth daily.     losartan (COZAAR) 100 MG tablet Take 100 mg by mouth daily.     meclizine (ANTIVERT) 25 MG tablet Take 1 tablet (25 mg total) by mouth 3 (three) times daily as needed for dizziness. 30 tablet 0   Multiple Vitamins-Minerals (HAIR SKIN & NAILS) TABS Take 1 tablet by mouth daily.     oxyCODONE (ROXICODONE) 5 MG immediate release tablet Take 1 tablet (5 mg total) by mouth every 6 (six) hours as needed for severe pain. 15 tablet 0   pantoprazole (PROTONIX) 40 MG tablet Take 40 mg by mouth daily.     atorvastatin (LIPITOR) 40 MG tablet Take 40 mg by mouth daily.      No results found for this or any previous visit (from the past 48 hour(s)). No results found.  Review of Systems   Musculoskeletal:  Positive for arthralgias.  All other systems reviewed and are negative.   There were no vitals taken for this visit. Physical Exam Vitals reviewed.  HENT:     Head: Normocephalic.     Nose: Nose normal.     Mouth/Throat:     Mouth: Mucous membranes are moist.  Cardiovascular:     Rate and Rhythm: Normal rate.     Pulses: Normal pulses.  Pulmonary:     Effort: Pulmonary effort is normal.  Abdominal:     General: Abdomen is flat.  Musculoskeletal:     Cervical back: Normal range of motion.  Skin:    General: Skin is warm.     Capillary Refill: Capillary refill takes less than 2 seconds.  Neurological:     General: No focal deficit present.     Mental Status: She is alert.  Psychiatric:        Mood and Affect: Mood normal.    Ortho exam demonstrates functional deltoid on the right. No real asymmetric AC joint tenderness right versus left. Neck range of motion intact. Motor or sensory function to the right and left hand intact. Radial pulse intact. No masses lymphadenopathy or skin changes noted in that shoulder girdle region.  Skin incisions okay from trigger digit release.  She does have diminished forward flexion and abduction to just under 90 degrees. External rotation strength is 3 out of 5 on the right compared to 5 out of 5 on the left. Some coarseness and grinding is present. That is with passive range of motion of that right shoulder.   Assessment/Plan Impression is right shoulder rotator cuff arthropathy with atrophy along with degenerative changes in the glenohumeral joint.  Liver enzyme issue has resolved.  I think she is a reasonable candidate for reverse shoulder replacement. The risk and benefits of the procedure discussed with the patient include not limited to infection or vessel damage incomplete restoration of function as well as incomplete pain relief. The rationale behind patient specific instrumentation is described with the use of models.  Patient understands the risk and benefits and wishes to proceed.  All questions answered.  Patient has undergone trigger digit release prior to this procedure and has good healing from that intervention.  Burnard Bunting, MD 07/15/2023, 6:00 AM

## 2023-07-16 ENCOUNTER — Encounter (HOSPITAL_COMMUNITY): Payer: Self-pay | Admitting: Orthopedic Surgery

## 2023-07-16 DIAGNOSIS — M7521 Bicipital tendinitis, right shoulder: Secondary | ICD-10-CM | POA: Diagnosis not present

## 2023-07-16 MED ORDER — METHOCARBAMOL 500 MG PO TABS: 500.0000 mg | ORAL_TABLET | Freq: Four times a day (QID) | ORAL | 0 refills | Status: AC | PRN

## 2023-07-16 MED ORDER — ACETAMINOPHEN 325 MG PO TABS: 325.0000 mg | ORAL_TABLET | Freq: Four times a day (QID) | ORAL | 0 refills | Status: AC | PRN

## 2023-07-16 MED ORDER — CELECOXIB 100 MG PO CAPS: 100.0000 mg | ORAL_CAPSULE | Freq: Two times a day (BID) | ORAL | 0 refills | Status: AC

## 2023-07-16 MED ORDER — ASPIRIN 81 MG PO TBEC: 81.0000 mg | DELAYED_RELEASE_TABLET | Freq: Every day | ORAL | 12 refills | Status: AC

## 2023-07-16 MED ORDER — DOCUSATE SODIUM 100 MG PO CAPS: 100.0000 mg | ORAL_CAPSULE | Freq: Two times a day (BID) | ORAL | 0 refills | Status: AC

## 2023-07-16 MED ORDER — OXYCODONE HCL 5 MG PO TABS: 5.0000 mg | ORAL_TABLET | ORAL | 0 refills | Status: AC | PRN

## 2023-07-16 NOTE — Progress Notes (Signed)
Occupational Therapy Treatment Patient Details Name: Shawna Thompson MRN: [id number] DOB: Feb 26, 1946 Today's Date: 07/16/2023   History of present illness 77 year old female s/p reverse shoudler arthroplasty, biceps tenodesis. History includes never smoker, HTN, hypercholesterolemia, GERD, iron deficiency anemia, chronic fatigue syndrome, vertigo, glaucoma, hiatal hernia, dysphagia, arthritis, postmenopausal bleeding (s/p D&C, resection of endometrial polyp 11/09/22). S/p EGD & colonoscopy 12/02/22. S/p left long finger trigger release 06/24/23 and is scheduled for right trigger thumb release on 07/08/23.  BMI is consistent with obesity.   OT comments  Patient making good gains with OT treatment with mod assist to donn pants while seated on EOB. BP monitored during session with 141/88 once in recliner, 139/123 after static standing, and 139/123 after ambulating to bathroom and back to recliner. Static standing performed with BP lowering to 123/61. Patient demonstrating good understanding of sling wear and precautions. Acute OT to continue to follow with HHOT to follow.       If plan is discharge home, recommend the following:  A little help with walking and/or transfers;A little help with bathing/dressing/bathroom;Assistance with cooking/housework;Assist for transportation;Help with stairs or ramp for entrance   Equipment Recommendations  BSC/3in1;Tub/shower bench    Recommendations for Other Services      Precautions / Restrictions Precautions Precautions: Shoulder;Fall;Other (comment) Type of Shoulder Precautions: Reverse total shoulder Shoulder Interventions: Shoulder sling/immobilizer;At all times Precaution Booklet Issued: Yes (comment) Precaution Comments: 0-30 ER, no AROM, can do pendulums Required Braces or Orthoses: Sling Restrictions Weight Bearing Restrictions: Yes RUE Weight Bearing: Non weight bearing       Mobility Bed Mobility Overal bed mobility: Needs  Assistance Bed Mobility: Supine to Sit     Supine to sit: Supervision     General bed mobility comments: able to get to EOB exiting on left with use of bed rail, discussed purchasing bed rail for home    Transfers Overall transfer level: Needs assistance Equipment used: 1 person hand held assist Transfers: Sit to/from Stand, Bed to chair/wheelchair/BSC Sit to Stand: Contact guard assist     Step pivot transfers: Contact guard assist     General transfer comment: CGA for transfers with occasional min assist when she complains of dizziness for safety     Balance Overall balance assessment: Needs assistance Sitting-balance support: No upper extremity supported, Feet supported Sitting balance-Leahy Scale: Good Sitting balance - Comments: able to perform LB dressing seated on EOB   Standing balance support: Single extremity supported, During functional activity, No upper extremity supported Standing balance-Leahy Scale: Fair Standing balance comment: able to stand with no UE support, often used IV pole for support                           ADL either performed or assessed with clinical judgement   ADL Overall ADL's : Needs assistance/impaired Eating/Feeding: Set up;Sitting   Grooming: Set up;Sitting               Lower Body Dressing: Moderate assistance;Sit to/from stand Lower Body Dressing Details (indicate cue type and reason): min assist to thread legs into clothing and mod assist to pull up pants, assistance with socks Toilet Transfer: Contact guard assist;Ambulation             General ADL Comments: CGA for balance with occasional min assist with complaints of dizziness    Extremity/Trunk Assessment Upper Extremity Assessment RUE Deficits / Details: Reverse total shoulder, nerve blco kmay still be causing numbness in RUE. history  of trigger fingers in B hands, surgically resolved in the last month RUE Sensation: decreased light touch RUE  Coordination: decreased gross motor;decreased fine motor LUE Deficits / Details: history of trigger fingers, surgically resolved in the last month LUE Sensation: WNL LUE Coordination: decreased fine motor            Vision       Perception     Praxis      Cognition Arousal: Alert Behavior During Therapy: WFL for tasks assessed/performed Overall Cognitive Status: Within Functional Limits for tasks assessed                                 General Comments: Aware of shoulder precautions        Exercises      Shoulder Instructions       General Comments BP once up in recliner 141/88, after standing 139/123, after ambulating to bathroom and back to recliner 139/123, 123/61 following static standing, and 121/60 at end of session    Pertinent Vitals/ Pain       Pain Assessment Pain Assessment: 0-10 Pain Score: 6  Pain Location: R shoulder Pain Descriptors / Indicators: Aching Pain Intervention(s): Monitored during session, Limited activity within patient's tolerance, Premedicated before session  Home Living Family/patient expects to be discharged to:: Private residence Living Arrangements: Children Available Help at Discharge: Available PRN/intermittently Type of Home: House Home Access: Level entry     Home Layout: One level     Bathroom Shower/Tub: Chief Strategy Officer: Handicapped height Bathroom Accessibility: Yes   Home Equipment: Warehouse manager bars - tub/shower   Additional Comments: Pt lives with children and great grandchildren, available for most of the day      Prior Functioning/Environment              Frequency  Min 1X/week        Progress Toward Goals  OT Goals(current goals can now be found in the care plan section)  Progress towards OT goals: Progressing toward goals  Acute Rehab OT Goals Patient Stated Goal: go home OT Goal Formulation: With patient Time For Goal Achievement:  07/29/23 Potential to Achieve Goals: Good ADL Goals Pt Will Perform Lower Body Dressing: with min assist;sit to/from stand Pt Will Transfer to Toilet: with modified independence;ambulating Pt Will Perform Toileting - Clothing Manipulation and hygiene: with modified independence Pt Will Perform Tub/Shower Transfer: with modified independence;tub bench  Plan      Co-evaluation                 AM-PAC OT "6 Clicks" Daily Activity     Outcome Measure   Help from another person eating meals?: A Little Help from another person taking care of personal grooming?: A Little Help from another person toileting, which includes using toliet, bedpan, or urinal?: A Little Help from another person bathing (including washing, rinsing, drying)?: A Lot Help from another person to put on and taking off regular upper body clothing?: A Little Help from another person to put on and taking off regular lower body clothing?: A Lot 6 Click Score: 16    End of Session Equipment Utilized During Treatment: Gait belt  OT Visit Diagnosis: Unsteadiness on feet (R26.81);Other abnormalities of gait and mobility (R26.89);Muscle weakness (generalized) (M62.81);Pain Pain - Right/Left: Right Pain - part of body: Shoulder   Activity Tolerance Patient tolerated treatment well   Patient Left in bed;with  call bell/phone within reach   Nurse Communication Mobility status        Time: 0810-0839 OT Time Calculation (min): 29 min  Charges: OT General Charges $OT Visit: 1 Visit OT Treatments $Self Care/Home Management : 8-22 mins $Therapeutic Activity: 8-22 mins  Alfonse Flavors, OTA Acute Rehabilitation Services  Office [phone]   Dewain Penning 07/16/2023, 9:32 AM

## 2023-07-16 NOTE — Evaluation (Signed)
Physical Therapy Evaluation Patient Details Name: Shawna Thompson MRN: [id number] DOB: Jan 08, 1946 Today's Date: 07/16/2023  History of Present Illness  77 year old female s/p reverse shoudler arthroplasty, biceps tenodesis. History includes never smoker, HTN, hypercholesterolemia, GERD, iron deficiency anemia, chronic fatigue syndrome, vertigo, glaucoma, hiatal hernia, dysphagia, arthritis, postmenopausal bleeding (s/p D&C, resection of endometrial polyp 11/09/22). S/p EGD & colonoscopy 12/02/22. S/p left long finger trigger release 06/24/23 and is scheduled for right trigger thumb release on 07/08/23.  BMI is consistent with obesity.  Clinical Impression  Pt presents with admitting diagnosis above. Pt today able to ambulate in hallway with no AD CGA/Supervision and no LOB noted. Pt reports PTA she was very independent with no AD still working and driving. Follow physicians recs for DC. Pt presents at or near baseline mobility. Pt has no further acute PT needs and will be signing off. Re consult PT if mobility status changes. Pt has no DME needs.       If plan is discharge home, recommend the following: A little help with walking and/or transfers;A little help with bathing/dressing/bathroom;Assistance with cooking/housework;Assist for transportation;Help with stairs or ramp for entrance;Assistance with feeding   Can travel by private vehicle        Equipment Recommendations None recommended by PT  Recommendations for Other Services       Functional Status Assessment Patient has had a recent decline in their functional status and demonstrates the ability to make significant improvements in function in a reasonable and predictable amount of time.     Precautions / Restrictions Precautions Precautions: Shoulder;Fall;Other (comment) Type of Shoulder Precautions: Reverse total shoulder Shoulder Interventions: Shoulder sling/immobilizer;At all times Precaution Booklet Issued: Yes  (comment) Precaution Comments: 0-30 ER, no AROM, can do pendulums Required Braces or Orthoses: Sling Restrictions Weight Bearing Restrictions: Yes RUE Weight Bearing: Non weight bearing      Mobility  Bed Mobility               General bed mobility comments: up in chair    Transfers Overall transfer level: Needs assistance Equipment used: 1 person hand held assist Transfers: Sit to/from Stand Sit to Stand: Contact guard assist           General transfer comment: CGA for safety. Pt reported dizziness however no LOB noted.    Ambulation/Gait Ambulation/Gait assistance: Supervision, Contact guard assist Gait Distance (Feet): 150 Feet Assistive device: None Gait Pattern/deviations: WFL(Within Functional Limits) Gait velocity: decreased     General Gait Details: no LOB noted.  Stairs            Wheelchair Mobility     Tilt Bed    Modified Rankin (Stroke Patients Only)       Balance Overall balance assessment: Needs assistance Sitting-balance support: No upper extremity supported, Feet supported Sitting balance-Leahy Scale: Good Sitting balance - Comments: able to perform LB dressing seated on EOB   Standing balance support: Single extremity supported, During functional activity, No upper extremity supported Standing balance-Leahy Scale: Good Standing balance comment: no LOB noted.                             Pertinent Vitals/Pain Pain Assessment Pain Assessment: 0-10 Pain Score: 6  Pain Location: R shoulder Pain Descriptors / Indicators: Aching Pain Intervention(s): Patient requesting pain meds-RN notified, Monitored during session    Home Living Family/patient expects to be discharged to:: Private residence Living Arrangements: Children Available Help at Discharge: Available PRN/intermittently  Type of Home: House Home Access: Level entry       Home Layout: One level Home Equipment: Shower seat;Crutches;Grab bars -  tub/shower Additional Comments: Pt lives with children and great grandchildren, available for most of the day    Prior Function Prior Level of Function : Independent/Modified Independent;Driving;Working/employed             Mobility Comments: Ind no AD ADLs Comments: Ind     Extremity/Trunk Assessment   Upper Extremity Assessment Upper Extremity Assessment: Defer to OT evaluation RUE Deficits / Details: Reverse total shoulder, nerve blco kmay still be causing numbness in RUE. history of trigger fingers in B hands, surgically resolved in the last month RUE Sensation: decreased light touch RUE Coordination: decreased gross motor;decreased fine motor LUE Deficits / Details: history of trigger fingers, surgically resolved in the last month LUE Sensation: WNL LUE Coordination: decreased fine motor    Lower Extremity Assessment Lower Extremity Assessment: Overall WFL for tasks assessed    Cervical / Trunk Assessment Cervical / Trunk Assessment: Normal  Communication   Communication Communication: No apparent difficulties  Cognition Arousal: Alert Behavior During Therapy: WFL for tasks assessed/performed Overall Cognitive Status: Within Functional Limits for tasks assessed                                 General Comments: Aware of shoulder precautions        General Comments General comments (skin integrity, edema, etc.): BP: 132/63 seated    Exercises     Assessment/Plan    PT Assessment Patient needs continued PT services  PT Problem List Decreased strength;Decreased range of motion;Decreased activity tolerance;Decreased balance;Decreased mobility;Decreased coordination;Decreased knowledge of use of DME;Decreased safety awareness;Decreased knowledge of precautions;Pain       PT Treatment Interventions DME instruction;Gait training;Stair training;Functional mobility training;Therapeutic activities;Therapeutic exercise;Balance training;Neuromuscular  re-education;Patient/family education    PT Goals (Current goals can be found in the Care Plan section)  Acute Rehab PT Goals PT Goal Formulation: All assessment and education complete, DC therapy    Frequency Min 1X/week     Co-evaluation               AM-PAC PT "6 Clicks" Mobility  Outcome Measure Help needed turning from your back to your side while in a flat bed without using bedrails?: A Little Help needed moving from lying on your back to sitting on the side of a flat bed without using bedrails?: A Little Help needed moving to and from a bed to a chair (including a wheelchair)?: A Little Help needed standing up from a chair using your arms (e.g., wheelchair or bedside chair)?: A Little Help needed to walk in hospital room?: A Little Help needed climbing 3-5 steps with a railing? : A Little 6 Click Score: 18    End of Session Equipment Utilized During Treatment: Gait belt Activity Tolerance: Patient tolerated treatment well Patient left: in chair;with call bell/phone within reach Nurse Communication: Mobility status PT Visit Diagnosis: Other abnormalities of gait and mobility (R26.89)    Time: 1610-9604 PT Time Calculation (min) (ACUTE ONLY): 20 min   Charges:   PT Evaluation $PT Eval Low Complexity: 1 Low   PT General Charges $$ ACUTE PT VISIT: 1 Visit         Shela Nevin, PT, DPT Acute Rehab Services [phone]   Gladys Damme 07/16/2023, 10:25 AM

## 2023-07-16 NOTE — Plan of Care (Signed)

## 2023-07-16 NOTE — Progress Notes (Signed)
  Subjective:   Patient stable.  Block still in effect. Objective: Vital signs in last 24 hours: Temp:  [97.7 F (36.5 C)-98.8 F (37.1 C)] 97.9 F (36.6 C) (10/11 0726) Pulse Rate:  [96-118] 96 (10/11 0726) Resp:  [11-18] 18 (10/11 0726) BP: (121-148)/(61-93) 132/61 (10/11 0726) SpO2:  [92 %-99 %] 98 % (10/11 0726)  Intake/Output from previous day: 10/10 0701 - 10/11 0700 In: 1000 [I.V.:600; IV Piggyback:400] Out: 375 [Urine:300; Blood:75] Intake/Output this shift: No intake/output data recorded.  Exam:  Intact pulses distally No cellulitis present  Labs: No results for input(s): "HGB" in the last 72 hours. No results for input(s): "WBC", "RBC", "HCT", "PLT" in the last 72 hours. No results for input(s): "NA", "K", "CL", "CO2", "BUN", "CREATININE", "GLUCOSE", "CALCIUM" in the last 72 hours. No results for input(s): "LABPT", "INR" in the last 72 hours.  Assessment/Plan: Plan at this time is occupational therapy this morning.  Physical therapy to make sure she is able to get up and around.  Anticipate discharge this afternoon.  Postop radiographs look good.  Home health physical therapy to also be requested.   Shawna Thompson 07/16/2023, 7:47 AM

## 2023-07-16 NOTE — TOC Transition Note (Signed)
Transition of Care [facility]) - CM/SW Discharge Note   Patient Details  Name: Shawna Thompson MRN: [id number] Date of Birth: 03-06-1946  [facility] CM/SW Contact:  Epifanio Lesches, RN Phone Number: 07/16/2023, 12:52 PM   Clinical Narrative:    Patient will DC to: home Anticipated DC date:07/16/2023 Family notified: yes Transport by: car     - s/p reverse shoudler arthroplasty, 10/10 Per MD patient ready for DC today. RN, and pt  aware of d/c plan. Worker's Compensation case: Claim # [id number] CM Synetta Fail [place] [phone], fax [phone]; [email or web address])  Claims Adjuster: Rexene Agent [phone], fax # [phone]  Pt without DME needs. Home health orders emailed to [email or web address]. Per Synetta Fail states services will not begin until next week, MD made aware via  secure chat. Post hospital f/u noted on AVS.  Pt with transportation to home .  Pt without RX med concerns.  Home health orders emailed to [email or web address]   RNCM will sign off for now as intervention is no longer needed. Please consult [place] again if new needs arise.    Final next level of care: Home w Home Health Services Barriers to Discharge: No Barriers Identified   Patient Goals and CMS Choice      Discharge Placement                         Discharge Plan and Services Additional resources added to the After Visit Summary for                                       Social Determinants of Health (SDOH) Interventions SDOH Screenings   Social Connections: Unknown (03/10/2023)   Received from [facility]  Tobacco Use: Low Risk  (07/15/2023)     Readmission Risk Interventions     No data to display

## 2023-07-19 ENCOUNTER — Telehealth: Payer: Self-pay | Admitting: Orthopedic Surgery

## 2023-07-19 ENCOUNTER — Telehealth: Payer: Self-pay | Admitting: Surgery

## 2023-07-19 ENCOUNTER — Encounter: Payer: Medicare PPO | Admitting: Orthopedic Surgery

## 2023-07-19 NOTE — Telephone Encounter (Signed)
Patient called needing to reschedule her appt today 10/14. Patient advised to call back to reschedule with Dr. Fara Boros this week or next please. Not able to open a slot.  Claude Manges

## 2023-07-19 NOTE — Telephone Encounter (Signed)
Called Patient for appt with Dr. Fara Boros 10/23 11am

## 2023-07-22 MED ORDER — BUPIVACAINE-EPINEPHRINE (PF) 0.5% -1:200000 IJ SOLN
INTRAMUSCULAR | Status: DC | PRN
Start: 2023-07-15 — End: 2023-07-22
  Administered 2023-07-15: 15 mL

## 2023-07-22 MED ORDER — BUPIVACAINE LIPOSOME 1.3 % IJ SUSP
INTRAMUSCULAR | Status: DC | PRN
Start: 2023-07-15 — End: 2023-07-22
  Administered 2023-07-15: 10 mL via PERINEURAL

## 2023-07-22 NOTE — Anesthesia Postprocedure Evaluation (Signed)
Anesthesia Post Note  Patient: Shawna Thompson  Procedure(s) Performed: REVERSE SHOULDER ARTHROPLASTY (Right: Shoulder) BICEPS TENODESIS (Right: Shoulder)     Patient location during evaluation: PACU Anesthesia Type: Regional and General Level of consciousness: awake and alert Pain management: pain level controlled Vital Signs Assessment: post-procedure vital signs reviewed and stable Respiratory status: spontaneous breathing, nonlabored ventilation and respiratory function stable Cardiovascular status: blood pressure returned to baseline and stable Postop Assessment: no apparent nausea or vomiting Anesthetic complications: no   No notable events documented.                Ivett Luebbe

## 2023-07-22 NOTE — Anesthesia Procedure Notes (Signed)
Anesthesia Regional Block: Interscalene brachial plexus block   Pre-Anesthetic Checklist: , timeout performed,  Correct Patient, Correct Site, Correct Laterality,  Correct Procedure, Correct Position, site marked,  Risks and benefits discussed,  Surgical consent,  Pre-op evaluation,  At surgeon's request and post-op pain management  Laterality: Right and Upper  Prep: chloraprep       Needles:  Injection technique: Single-shot      Needle Length: 5cm  Needle Gauge: 22     Additional Needles: Arrow StimuQuik ECHO Echogenic Stimulating PNB Needle  Procedures:,,,, ultrasound used (permanent image in chart),,    Narrative:  Start time: 07/15/2023 7:21 AM End time: 07/15/2023 7:27 AM Injection made incrementally with aspirations every 5 mL.  Performed by: Personally  Anesthesiologist: Val Eagle, MD

## 2023-07-23 ENCOUNTER — Other Ambulatory Visit: Payer: Self-pay | Admitting: Orthopedic Surgery

## 2023-07-23 DIAGNOSIS — M170 Bilateral primary osteoarthritis of knee: Secondary | ICD-10-CM

## 2023-07-23 DIAGNOSIS — M160 Bilateral primary osteoarthritis of hip: Secondary | ICD-10-CM

## 2023-07-23 DIAGNOSIS — M75101 Unspecified rotator cuff tear or rupture of right shoulder, not specified as traumatic: Secondary | ICD-10-CM

## 2023-07-28 ENCOUNTER — Ambulatory Visit (INDEPENDENT_AMBULATORY_CARE_PROVIDER_SITE_OTHER): Payer: Medicare PPO | Admitting: Orthopedic Surgery

## 2023-07-28 ENCOUNTER — Ambulatory Visit: Payer: Medicare PPO | Admitting: Orthopedic Surgery

## 2023-07-28 DIAGNOSIS — M65311 Trigger thumb, right thumb: Secondary | ICD-10-CM

## 2023-07-28 NOTE — Progress Notes (Signed)
Rhyder Grandin - 77 y.o. female MRN [id number]  Date of birth: 1946/08/05  Office Visit Note: Visit Date: 07/28/2023 PCP: Fleet Contras, MD Referred by: Fleet Contras, MD  Subjective:  HPI: Emi Velasques is a 77 y.o. female who presents today for follow up 3 weeks status post right thumb trigger release.  She is doing well overall, clicking and locking of the thumb has resolved.  Underwent her right shoulder surgery with Dr. August Saucer as scheduled.  Pertinent ROS were reviewed with the patient and found to be negative unless otherwise specified above in HPI.   Assessment & Plan: Visit Diagnoses: No diagnosis found.  Plan: Sutures removed today from the right thumb.  She is very pleased with her overall outcome, no residual clicking or locking of the thumb on today's examination.  Activities as tolerated for the right hand, follow-up as needed.  Follow-up: No follow-ups on file.   Meds & Orders: No orders of the defined types were placed in this encounter.  No orders of the defined types were placed in this encounter.    Procedures: No procedures performed       Objective:   Vital Signs: There were no vitals taken for this visit.  Ortho Exam Right hand: - Thumb with well-healed incision over the volar aspect of the MP joint, skin edges were approximated, sutures in place, no erythema or drainage - Flexion extension of the thumb IP joint without residual clicking or locking, range of motion 0-55  Imaging: No results found.   Anshul Trevor Mace, M.D. [facility] 11:14 AM

## 2023-07-30 ENCOUNTER — Ambulatory Visit (INDEPENDENT_AMBULATORY_CARE_PROVIDER_SITE_OTHER): Payer: Worker's Compensation | Admitting: Orthopedic Surgery

## 2023-07-30 ENCOUNTER — Other Ambulatory Visit (INDEPENDENT_AMBULATORY_CARE_PROVIDER_SITE_OTHER): Payer: Worker's Compensation

## 2023-07-30 DIAGNOSIS — Z96611 Presence of right artificial shoulder joint: Secondary | ICD-10-CM

## 2023-07-31 ENCOUNTER — Encounter: Payer: Self-pay | Admitting: Orthopedic Surgery

## 2023-07-31 NOTE — Progress Notes (Signed)
Post-Op Visit Note   Patient: Shawna Thompson           Date of Birth: 1945/12/06           MRN: [id number] Visit Date: 07/30/2023 PCP: Fleet Contras, MD   Assessment & Plan:  Chief Complaint:  Chief Complaint  Patient presents with   Right Shoulder - Routine Post Op    RIGHT RSA, BT (surgery date 07-15-23)   Visit Diagnoses:  1. History of arthroplasty of right shoulder     Plan: Vance Gather is a 77 year old patient who is now 2 weeks out from right shoulder reverse replacement.  Patient taking Tylenol for pain.  She has a high brace at home but it is difficult for her to use.  We did work out something where she is going to call the vendor for that particular machine which was set up by [employer].  Her deltoid fires.  Incision intact.  Discontinue the sling at this time.  Work primarily on range of motion exercises at least 3 to 4 hours a day.  She is going to continue home health physical therapy and a prescription is provided for that for parameters to engage in.  Also gave her a prescription for her range of motion machine.  In 3 weeks we will start outpatient physical therapy.  4-week return for clinical recheck.  Radiographs look good today.  Follow-Up Instructions: No follow-ups on file.   Orders:  Orders Placed This Encounter  Procedures   XR Shoulder Right   No orders of the defined types were placed in this encounter.   Imaging: XR Shoulder Right  Result Date: 07/31/2023 AP axillary outlet radiographs right shoulder reviewed.  Reverse shoulder prosthesis in good position alignment with no palpitating features.  Shoulder is located.   PMFS History: Patient Active Problem List   Diagnosis Date Noted   S/P reverse total shoulder arthroplasty, right 07/15/2023   Trigger finger, right middle finger 04/17/2021   Primary osteoarthritis of right knee 12/11/2019   Primary osteoarthritis of left knee 12/11/2019   Bilateral primary osteoarthritis of knee  02/28/2019   Trochanteric bursitis of left hip 10/06/2018   Morbid obesity (HCC) 08/05/2018   Body mass index 40.0-44.9, adult (HCC) 08/05/2018   Chondromalacia, right knee    Positive ANA (antinuclear antibody) 06/14/2018   Chronic pain of right knee 06/14/2018   Acute medial meniscus tear, right, initial encounter 06/14/2018   Past Medical History:  Diagnosis Date   Arthritis    Chronic fatigue syndrome    Constipation    Depression    Dysphagia    GERD (gastroesophageal reflux disease)    Glaucoma    Dr Dione Booze   High cholesterol    History of hiatal hernia    Hypertension    IDA (iron deficiency anemia)    Hx   MMT (medial meniscus tear)    right   Obesity    Vertigo     Family History  Problem Relation Age of Onset   Diabetes Mother    Heart failure Mother    Heart failure Father    Colon cancer Neg Hx    Rectal cancer Neg Hx    Stomach cancer Neg Hx    Esophageal cancer Neg Hx    Liver cancer Neg Hx    Pancreatic cancer Neg Hx     Past Surgical History:  Procedure Laterality Date   BICEPT TENODESIS Right 07/15/2023   Procedure: BICEPS TENODESIS;  Surgeon: August Saucer,  Corrie Mckusick, MD;  Location: [facility] OR;  Service: Orthopedics;  Laterality: Right;   CHOLECYSTECTOMY     COLONOSCOPY  09/19/2013   Dr Barrett [facility]  The entire examined colon is normal   DILATATION & CURETTAGE/HYSTEROSCOPY WITH MYOSURE N/A 11/09/2022   Procedure: DILATATION & CURETTAGE/HYSTEROSCOPY WITH MYOSURE;  Surgeon: Richarda Overlie, MD;  Location: [facility];  Service: Gynecology;  Laterality: N/A;   KNEE ARTHROSCOPY WITH MEDIAL MENISECTOMY Right 06/22/2018   Procedure: RIGHT KNEE ARTHROSCOPY WITH MEDIAL MENISCECTOMY, CHONDROPLASTY;  Surgeon: Tarry Kos, MD;  Location: [facility];  Service: Orthopedics;  Laterality: Right;   REVERSE SHOULDER ARTHROPLASTY Right 07/15/2023   Procedure: REVERSE SHOULDER ARTHROPLASTY;  Surgeon: Cammy Copa, MD;  Location: [facility]  OR;  Service: Orthopedics;  Laterality: Right;   SHOULDER ARTHROSCOPY     removal of bone spur   TONSILLECTOMY     TUBAL LIGATION  2000   Social History   Occupational History   Not on file  Tobacco Use   Smoking status: Never   Smokeless tobacco: Never   Tobacco comments:    Quit smoking at 77 years old (smoked for 10 years)  Vaping Use   Vaping status: Never Used  Substance and Sexual Activity   Alcohol use: Not Currently    Comment: rare   Drug use: Never   Sexual activity: Yes    Birth control/protection: Post-menopausal

## 2023-08-02 ENCOUNTER — Other Ambulatory Visit: Payer: Self-pay

## 2023-08-02 DIAGNOSIS — Z96611 Presence of right artificial shoulder joint: Secondary | ICD-10-CM

## 2023-08-03 DIAGNOSIS — M19011 Primary osteoarthritis, right shoulder: Secondary | ICD-10-CM

## 2023-08-03 DIAGNOSIS — M7521 Bicipital tendinitis, right shoulder: Secondary | ICD-10-CM

## 2023-08-08 NOTE — Discharge Summary (Signed)
Physician Discharge Summary      Patient ID: Shawna Thompson MRN: [id number] DOB/AGE: 12-15-1945 77 y.o.  Admit date: 07/15/2023 Discharge date: 07/16/2023  Admission Diagnoses:  Principal Problem:   S/P reverse total shoulder arthroplasty, right Active Problems:   Arthritis of right shoulder region   Biceps tendonitis on right   Discharge Diagnoses:  Same  Surgeries: Procedure(s): REVERSE SHOULDER ARTHROPLASTY BICEPS TENODESIS on 07/15/2023   Consultants:   Discharged Condition: Stable  Hospital Course: Malisa Ruggiero is an 77 y.o. female who was admitted 07/15/2023 with a chief complaint of right shoulder arthritis/pain, and found to have a diagnosis of right shoulder arthritis.  They were brought to the operating room on 07/15/2023 and underwent the above named procedures.  Pt awoke from anesthesia without complication and was transferred to the floor. On POD1, patient's pain was controlled.  No red flag signs or symptoms.  Discharged home on POD 1..  Pt will f/u with Dr. August Saucer in clinic in ~2 weeks.   Antibiotics given:  Anti-infectives (From admission, onward)    Start     Dose/Rate Route Frequency Ordered Stop   07/15/23 1630  ceFAZolin (ANCEF) IVPB 2g/100 mL premix        2 g 200 mL/hr over 30 Minutes Intravenous Every 8 hours 07/15/23 1501 07/16/23 0652   07/15/23 0953  vancomycin (VANCOCIN) powder  Status:  Discontinued          As needed 07/15/23 0954 07/15/23 1107   07/15/23 0600  ceFAZolin (ANCEF) IVPB 2g/100 mL premix        2 g 200 mL/hr over 30 Minutes Intravenous On call to O.R. 07/15/23 4098 07/15/23 0735     .  Recent vital signs:  Vitals:   07/16/23 0726 07/16/23 1348  BP: 132/61 121/63  Pulse: 96 (!) 106  Resp: 18 16  Temp: 97.9 F (36.6 C) 97.8 F (36.6 C)  SpO2: 98% 98%    Recent laboratory studies:  Results for orders placed or performed during the hospital encounter of 07/06/23  Surgical pcr screen   Specimen: Nasal Mucosa;  Nasal Swab  Result Value Ref Range   MRSA, PCR NEGATIVE NEGATIVE   Staphylococcus aureus NEGATIVE NEGATIVE  Urine Culture   Specimen: Urine, Random  Result Value Ref Range   Specimen Description URINE, RANDOM    Special Requests NONE    Culture      NO GROWTH Performed at [facility], [address], [place], [place] [address]    Report Status 07/08/2023 FINAL   CBC  Result Value Ref Range   WBC 9.0 4.0 - 10.5 K/uL   RBC 4.73 3.87 - 5.11 MIL/uL   Hemoglobin 11.8 (L) 12.0 - 15.0 g/dL   HCT 78.2 95.6 - 21.3 %   MCV 79.3 (L) 80.0 - 100.0 fL   MCH 24.9 (L) 26.0 - 34.0 pg   MCHC 31.5 30.0 - 36.0 g/dL   RDW 08.6 57.8 - 46.9 %   Platelets 284 150 - 400 K/uL   nRBC 0.0 0.0 - 0.2 %  Basic metabolic panel  Result Value Ref Range   Sodium 140 135 - 145 mmol/L   Potassium 3.7 3.5 - 5.1 mmol/L   Chloride 108 98 - 111 mmol/L   CO2 22 22 - 32 mmol/L   Glucose, Bld 104 (H) 70 - 99 mg/dL   BUN 10 8 - 23 mg/dL   Creatinine, Ser 6.29 0.44 - 1.00 mg/dL   Calcium 8.6 (L)  8.9 - 10.3 mg/dL   GFR, Estimated >46 >96 mL/min   Anion gap 10 5 - 15  Urinalysis, w/ Reflex to Culture (Infection Suspected) -Urine, Clean Catch  Result Value Ref Range   Specimen Source URINE, CLEAN CATCH    Color, Urine YELLOW YELLOW   APPearance CLEAR CLEAR   Specific Gravity, Urine 1.015 1.005 - 1.030   pH 5.0 5.0 - 8.0   Glucose, UA NEGATIVE NEGATIVE mg/dL   Hgb urine dipstick NEGATIVE NEGATIVE   Bilirubin Urine NEGATIVE NEGATIVE   Ketones, ur NEGATIVE NEGATIVE mg/dL   Protein, ur NEGATIVE NEGATIVE mg/dL   Nitrite NEGATIVE NEGATIVE   Leukocytes,Ua NEGATIVE NEGATIVE   RBC / HPF 0-5 0 - 5 RBC/hpf   WBC, UA 0-5 0 - 5 WBC/hpf   Bacteria, UA RARE (A) NONE SEEN   Squamous Epithelial / HPF 0-5 0 - 5 /HPF   Mucus PRESENT    Hyaline Casts, UA PRESENT     Discharge Medications:   Allergies as of 07/16/2023   No Known Allergies      Medication List     STOP taking these medications    pantoprazole  40 MG tablet Commonly known as: PROTONIX       TAKE these medications    acetaminophen 325 MG tablet Commonly known as: TYLENOL Take 1-2 tablets (325-650 mg total) by mouth every 6 (six) hours as needed for mild pain (pain score 1-3 or temp > 100.5).   aspirin EC 81 MG tablet Take 1 tablet (81 mg total) by mouth daily. Swallow whole.   atorvastatin 40 MG tablet Commonly known as: LIPITOR Take 40 mg by mouth daily.   BIOTIN PO Take 1 tablet by mouth every other day.   celecoxib 100 MG capsule Commonly known as: CELEBREX Take 1 capsule (100 mg total) by mouth 2 (two) times daily.   CINNAMON PO Take 1 capsule by mouth daily.   docusate sodium 100 MG capsule Commonly known as: COLACE Take 1 capsule (100 mg total) by mouth 2 (two) times daily.   famotidine 20 MG tablet Commonly known as: PEPCID Take 20 mg by mouth daily.   Hair Skin & Nails Tabs Take 1 tablet by mouth daily.   hydrochlorothiazide 25 MG tablet Commonly known as: HYDRODIURIL Take 25 mg by mouth daily.   losartan 100 MG tablet Commonly known as: COZAAR Take 100 mg by mouth daily.   meclizine 25 MG tablet Commonly known as: ANTIVERT Take 1 tablet (25 mg total) by mouth 3 (three) times daily as needed for dizziness.   methocarbamol 500 MG tablet Commonly known as: ROBAXIN Take 1 tablet (500 mg total) by mouth every 6 (six) hours as needed for muscle spasms.   oxyCODONE 5 MG immediate release tablet Commonly known as: Oxy IR/ROXICODONE Take 1-2 tablets (5-10 mg total) by mouth every 4 (four) hours as needed for moderate pain (pain score 4-6). What changed:  how much to take when to take this reasons to take this   VITAMIN D3 PO Take 1 tablet by mouth daily.        Diagnostic Studies: XR Shoulder Right  Result Date: 07/31/2023 AP axillary outlet radiographs right shoulder reviewed.  Reverse shoulder prosthesis in good position alignment with no palpitating features.  Shoulder is  located.  DG Shoulder Right Port  Result Date: 07/15/2023 CLINICAL DATA:  Status post right shoulder arthroplasty. EXAM: RIGHT SHOULDER - 1 VIEW COMPARISON:  Right shoulder radiographs 07/20/2022, CT right shoulder 07/20/2022, MRI right  shoulder 02/25/2023 FINDINGS: Interval reverse total right shoulder arthroplasty. No perihardware lucency is seen to indicate hardware failure or loosening. Mild acromioclavicular joint space narrowing and peripheral osteophytosis. No acute fracture or dislocation. The visualized portion of the right lung is unremarkable. IMPRESSION: Interval reverse total right shoulder arthroplasty. No evidence of hardware failure or loosening. Electronically Signed   By: Neita Garnet M.D.   On: 07/15/2023 14:34    Disposition: Discharge disposition: 01-Home or Self Care       Discharge Instructions     Call MD / Call 911   Complete by: As directed    If you experience chest pain or shortness of breath, CALL 911 and be transported to the hospital emergency room.  If you develope a fever above 101 F, pus (white drainage) or increased drainage or redness at the wound, or calf pain, call your surgeon's office.   Constipation Prevention   Complete by: As directed    Drink plenty of fluids.  Prune juice may be helpful.  You may use a stool softener, such as Colace (over the counter) 100 mg twice a day.  Use MiraLax (over the counter) for constipation as needed.   Diet - low sodium heart healthy   Complete by: As directed    Discharge instructions   Complete by: As directed    Okay to shower dressing is waterproof Use range of motion brace 1 hour 3 times a day starting tomorrow No lifting with right arm   Face-to-face encounter (required for Medicare/Medicaid patients)   Complete by: As directed    I Burnard Bunting certify that this patient is under my care and that I, or a nurse practitioner or physician's assistant working with me, had a face-to-face encounter that meets  the physician face-to-face encounter requirements with this patient on 07/16/2023. The encounter with the patient was in whole, or in part for the following medical condition(s) which is the primary reason for home health care (List medical condition): Right shoulder arthritis status post shoulder replacement with additional history of vertigo   The encounter with the patient was in whole, or in part, for the following medical condition, which is the primary reason for home health care: Right shoulder arthritis   I certify that, based on my findings, the following services are medically necessary home health services: Physical therapy   Reason for Medically Necessary Home Health Services: Therapy- Therapeutic Exercises to Increase Strength and Endurance   My clinical findings support the need for the above services: Pain interferes with ambulation/mobility   Further, I certify that my clinical findings support that this patient is homebound due to: Pain interferes with ambulation/mobility   Home Health   Complete by: As directed    To provide the following care/treatments:  Home Health Aide OT     Increase activity slowly as tolerated   Complete by: As directed    Post-operative opioid taper instructions:   Complete by: As directed    POST-OPERATIVE OPIOID TAPER INSTRUCTIONS: It is important to wean off of your opioid medication as soon as possible. If you do not need pain medication after your surgery it is ok to stop day one. Opioids include: Codeine, Hydrocodone(Norco, Vicodin), Oxycodone(Percocet, oxycontin) and hydromorphone amongst others.  Long term and even short term use of opiods can cause: Increased pain response Dependence Constipation Depression Respiratory depression And more.  Withdrawal symptoms can include Flu like symptoms Nausea, vomiting And more Techniques to manage these symptoms Hydrate well Eat  regular healthy meals Stay active Use relaxation techniques(deep  breathing, meditating, yoga) Do Not substitute Alcohol to help with tapering If you have been on opioids for less than two weeks and do not have pain than it is ok to stop all together.  Plan to wean off of opioids This plan should start within one week post op of your joint replacement. Maintain the same interval or time between taking each dose and first decrease the dose.  Cut the total daily intake of opioids by one tablet each day Next start to increase the time between doses. The last dose that should be eliminated is the evening dose.             SignedKarenann Cai 08/08/2023, 10:11 AM

## 2023-08-16 ENCOUNTER — Telehealth: Payer: Self-pay | Admitting: Orthopaedic Surgery

## 2023-08-16 NOTE — Telephone Encounter (Signed)
Pt called requesting gel injection fro right knee. submission for insurance company. Pt phone number is [phone].

## 2023-08-17 NOTE — Telephone Encounter (Signed)
VOB submitted for Monovisc, right knee  

## 2023-08-27 ENCOUNTER — Ambulatory Visit (INDEPENDENT_AMBULATORY_CARE_PROVIDER_SITE_OTHER): Payer: Worker's Compensation | Admitting: Orthopedic Surgery

## 2023-08-27 ENCOUNTER — Encounter: Payer: Self-pay | Admitting: Orthopedic Surgery

## 2023-08-27 DIAGNOSIS — Z96611 Presence of right artificial shoulder joint: Secondary | ICD-10-CM

## 2023-08-27 NOTE — Progress Notes (Unsigned)
Post-Op Visit Note   Patient: Shawna Thompson           Date of Birth: Jan 17, 1946           MRN: [id number] Visit Date: 08/27/2023 PCP: Fleet Contras, MD   Assessment & Plan:  Chief Complaint:  Chief Complaint  Patient presents with   Right Shoulder - Follow-up    RIGHT RSA, BT (surgery date 07-15-23)   Visit Diagnoses:  1. History of arthroplasty of right shoulder     Plan: Patient presents 6 weeks out from reverse shoulder replacement on the right.  About physical therapy and outpatient physical therapy is in progress.  Taking Tylenol for pain.  CPM machine is at 90.  Nurse case manager here today.  On exam she is got reasonable passive range of motion and good subscap strength as well as external rotation strength.  Deltoid fires.  Still is having some difficulty getting her arm up overhead.  I think that should improve with therapy as well as home exercise program.  6-week return for clinical recheck. Follow-Up Instructions: Return in about 6 weeks (around 10/08/2023).   Orders:  No orders of the defined types were placed in this encounter.  No orders of the defined types were placed in this encounter.   Imaging: No results found.  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   Trigger finger, right middle finger 04/17/2021   Primary osteoarthritis of right knee 12/11/2019   Primary osteoarthritis of left knee 12/11/2019   Bilateral primary osteoarthritis of knee 02/28/2019   Trochanteric bursitis of left hip 10/06/2018   Morbid obesity (HCC) 08/05/2018   Body mass index 40.0-44.9, adult (HCC) 08/05/2018   Chondromalacia, right knee    Positive ANA (antinuclear antibody) 06/14/2018   Chronic pain of right knee 06/14/2018   Acute medial meniscus tear, right, initial encounter 06/14/2018   Past  Medical History:  Diagnosis Date   Arthritis    Chronic fatigue syndrome    Constipation    Depression    Dysphagia    GERD (gastroesophageal reflux disease)    Glaucoma    Dr Dione Booze   High cholesterol    History of hiatal hernia    Hypertension    IDA (iron deficiency anemia)    Hx   MMT (medial meniscus tear)    right   Obesity    Vertigo     Family History  Problem Relation Age of Onset   Diabetes Mother    Heart failure Mother    Heart failure Father    Colon cancer Neg Hx    Rectal cancer Neg Hx    Stomach cancer Neg Hx    Esophageal cancer Neg Hx    Liver cancer Neg Hx    Pancreatic cancer Neg Hx     Past Surgical History:  Procedure Laterality Date   BICEPT TENODESIS Right 07/15/2023   Procedure: BICEPS TENODESIS;  Surgeon: Cammy Copa, MD;  Location: [facility] OR;  Service: Orthopedics;  Laterality: Right;   CHOLECYSTECTOMY     COLONOSCOPY  09/19/2013   Dr Barrett Shell GI  The entire examined colon is normal   DILATATION & CURETTAGE/HYSTEROSCOPY WITH MYOSURE N/A 11/09/2022   Procedure: DILATATION & CURETTAGE/HYSTEROSCOPY WITH MYOSURE;  Surgeon: Richarda Overlie, MD;  Location: [facility];  Service: Gynecology;  Laterality: N/A;   KNEE ARTHROSCOPY WITH MEDIAL MENISECTOMY Right 06/22/2018   Procedure: RIGHT KNEE ARTHROSCOPY WITH MEDIAL MENISCECTOMY, CHONDROPLASTY;  Surgeon: Tarry Kos, MD;  Location: [facility];  Service: Orthopedics;  Laterality: Right;   REVERSE SHOULDER ARTHROPLASTY Right 07/15/2023   Procedure: REVERSE SHOULDER ARTHROPLASTY;  Surgeon: Cammy Copa, MD;  Location: [facility] OR;  Service: Orthopedics;  Laterality: Right;   SHOULDER ARTHROSCOPY     removal of bone spur   TONSILLECTOMY     TUBAL LIGATION  2000   Social History   Occupational History   Not on file  Tobacco Use   Smoking status: Never   Smokeless tobacco: Never   Tobacco comments:    Quit smoking at 77 years old (smoked for 10 years)   Vaping Use   Vaping status: Never Used  Substance and Sexual Activity   Alcohol use: Not Currently    Comment: rare   Drug use: Never   Sexual activity: Yes    Birth control/protection: Post-menopausal

## 2023-08-28 ENCOUNTER — Encounter: Payer: Self-pay | Admitting: Orthopedic Surgery

## 2023-09-14 ENCOUNTER — Other Ambulatory Visit: Payer: Self-pay

## 2023-09-14 DIAGNOSIS — M170 Bilateral primary osteoarthritis of knee: Secondary | ICD-10-CM

## 2023-09-21 ENCOUNTER — Ambulatory Visit: Payer: Medicare PPO | Admitting: Orthopaedic Surgery

## 2023-10-04 ENCOUNTER — Ambulatory Visit: Payer: Medicare PPO | Admitting: Orthopaedic Surgery

## 2023-10-08 ENCOUNTER — Encounter (HOSPITAL_BASED_OUTPATIENT_CLINIC_OR_DEPARTMENT_OTHER): Payer: Self-pay | Admitting: Emergency Medicine

## 2023-10-08 ENCOUNTER — Other Ambulatory Visit: Payer: Self-pay

## 2023-10-08 ENCOUNTER — Emergency Department (HOSPITAL_BASED_OUTPATIENT_CLINIC_OR_DEPARTMENT_OTHER)
Admission: EM | Admit: 2023-10-08 | Discharge: 2023-10-08 | Disposition: A | Discharge status: Home / Self Care | Payer: Medicare HMO | Attending: Emergency Medicine | Admitting: Emergency Medicine

## 2023-10-08 DIAGNOSIS — R202 Paresthesia of skin: Secondary | ICD-10-CM | POA: Insufficient documentation

## 2023-10-08 DIAGNOSIS — Z7982 Long term (current) use of aspirin: Secondary | ICD-10-CM | POA: Diagnosis not present

## 2023-10-08 NOTE — ED Triage Notes (Signed)
 Numbness in left hand worse when moving/ driving  X 3 weeks  Getting worse  Able to move all finger, +cap refill, + pulses

## 2023-10-08 NOTE — ED Notes (Signed)
 RN reviewed discharge instructions with pt. Pt verbalized understanding and had no further questions. VSS upon discharge.

## 2023-10-08 NOTE — ED Provider Notes (Signed)
 [place] EMERGENCY DEPARTMENT AT [facility] Provider Note   CSN: [id number] Arrival date & time: 10/08/23  1257     History  Chief Complaint  Patient presents with   Hand Numbness    Shawna Thompson is a 78 y.o. female presenting to ED with concern for intermittent paresthesias in her left fingertips.  The patient reports that on a near daily basis for the past 3 to 4 weeks she has had episodes where the tips of her left fingers feel like they are going numb, and occasionally the numbness will extend up her hand into her mid forearm.  She says specifically this happens when she is driving and gripping the steering wheel, or having her elbow bent.  The symptoms will then improve when she extends her arm.  She denies prior history of carpal tunnel syndrome.  She does report that she had a trigger finger release performed by hand surgeon in September 2024 on her left hand.  HPI     Home Medications Prior to Admission medications   Medication Sig Start Date End Date Taking? Authorizing Provider  acetaminophen  (TYLENOL ) 325 MG tablet Take 1-2 tablets (325-650 mg total) by mouth every 6 (six) hours as needed for mild pain (pain score 1-3 or temp > 100.5). 07/16/23   Addie Cordella Hamilton, MD  aspirin  EC 81 MG tablet Take 1 tablet (81 mg total) by mouth daily. Swallow whole. 07/16/23   Addie Cordella Hamilton, MD  atorvastatin (LIPITOR) 40 MG tablet Take 40 mg by mouth daily.    [provider]  BIOTIN PO Take 1 tablet by mouth every other day.    [provider]  celecoxib  (CELEBREX ) 100 MG capsule Take 1 capsule (100 mg total) by mouth 2 (two) times daily. 07/16/23   Addie Cordella Hamilton, MD  Cholecalciferol (VITAMIN D3 PO) Take 1 tablet by mouth daily.    [provider]  CINNAMON PO Take 1 capsule by mouth daily.    [provider]  docusate sodium  (COLACE) 100 MG capsule Take 1 capsule (100 mg total) by mouth 2 (two) times daily. 07/16/23    Addie Cordella Hamilton, MD  famotidine  (PEPCID ) 20 MG tablet Take 20 mg by mouth daily. 07/19/20   [provider]  hydrochlorothiazide  (HYDRODIURIL ) 25 MG tablet Take 25 mg by mouth daily.    [provider]  losartan  (COZAAR ) 100 MG tablet Take 100 mg by mouth daily.    [provider]  meclizine  (ANTIVERT ) 25 MG tablet Take 1 tablet (25 mg total) by mouth 3 (three) times daily as needed for dizziness. 05/17/22   Prosperi, Christian H, PA-C  methocarbamol  (ROBAXIN ) 500 MG tablet Take 1 tablet (500 mg total) by mouth every 6 (six) hours as needed for muscle spasms. 07/16/23   Addie Cordella Hamilton, MD  Multiple Vitamins-Minerals (HAIR SKIN & NAILS) TABS Take 1 tablet by mouth daily.    [provider]  oxyCODONE  (OXY IR/ROXICODONE ) 5 MG immediate release tablet Take 1-2 tablets (5-10 mg total) by mouth every 4 (four) hours as needed for moderate pain (pain score 4-6). 07/16/23   Addie Cordella Hamilton, MD      Allergies    Patient has no known allergies.    Review of Systems   Review of Systems  Physical Exam Updated Vital Signs BP (!) 149/73 (BP Location: Right Arm)   Pulse 87   Temp 98.1 F (36.7 C)   Resp 18   SpO2 100%  Physical Exam  Constitutional:      General: She is not in acute distress. HENT:     Head: Normocephalic and atraumatic.  Eyes:     Conjunctiva/sclera: Conjunctivae normal.     Pupils: Pupils are equal, round, and reactive to light.  Cardiovascular:     Rate and Rhythm: Normal rate and regular rhythm.     Pulses: Normal pulses.  Pulmonary:     Effort: Pulmonary effort is normal. No respiratory distress.  Skin:    General: Skin is warm and dry.     Capillary Refill: Capillary refill takes less than 2 seconds.  Neurological:     General: No focal deficit present.     Mental Status: She is alert and oriented to person, place, and time. Mental status is at baseline.     Sensory: No sensory deficit.     Motor: No weakness.   Psychiatric:        Mood and Affect: Mood normal.        Behavior: Behavior normal.     ED Results / Procedures / Treatments   Labs (all labs ordered are listed, but only abnormal results are displayed) Labs Reviewed - No data to display  EKG None  Radiology No results found.  Procedures Procedures  Medications Ordered in ED Medications - No data to display  ED Course/ Medical Decision Making/ A&P                                 Medical Decision Making  Patient is presenting with complaint of distal fingertip paresthesias, of the left hand.  My strong suspicion is that this is a peripheral neuropathy, perhaps ulnar or median nerve entrapment, given that occur specifically when her wrist is bent driving a car or her elbow is compressed, and then gets better when she strains her arm.  The fact that the symptoms are not persistent lowers my suspicion significantly for CNS cause or stroke.  There is no indication for neuroimaging at this time.  The patient has an excellent radial pulse and normal capillary refills in her fingertips, with no discoloration to suggest a vascular problem or DVT or Raynaud's phenomenon.  I did recommend a wrist splint to keep her wrist in a neutral position, and also reaching out to her prior hand surgeon, given the proximity of her surgery to the onset of the symptoms.        Final Clinical Impression(s) / ED Diagnoses Final diagnoses:  Paresthesia    Rx / DC Orders ED Discharge Orders     None         Jaun Galluzzo, Donnice PARAS, MD 10/08/23 [phone]

## 2023-10-08 NOTE — Discharge Instructions (Addendum)
 Please call your hand surgeon's office to let them know about the numbness you are having in your fingertips.  This may potentially be a complication related to your hand surgery.  Alternatively talked about the possibility that you are pinching a nerve in your elbow or your wrist.  I advised that you wear a wrist brace for the next 2 weeks, particularly the daytime, when driving, to keep your wrist in a neutral position.  If you have persistent numbness or loss of feeling or weakness in your hand, as well as other strokelike symptoms (for example slurred speech, severe headache or dizziness, balance problems, facial droop, numbness or weakness in other parts of your body), please call 911 or return to the emergency department.

## 2023-10-15 ENCOUNTER — Ambulatory Visit (INDEPENDENT_AMBULATORY_CARE_PROVIDER_SITE_OTHER): Payer: Worker's Compensation | Admitting: Orthopedic Surgery

## 2023-10-15 ENCOUNTER — Ambulatory Visit: Payer: Medicare PPO | Admitting: Orthopaedic Surgery

## 2023-10-15 DIAGNOSIS — Z96611 Presence of right artificial shoulder joint: Secondary | ICD-10-CM

## 2023-10-16 ENCOUNTER — Encounter: Payer: Self-pay | Admitting: Orthopedic Surgery

## 2023-10-16 NOTE — Progress Notes (Signed)
 Post-Op Visit Note   Patient: Shawna Thompson           Date of Birth: 10/11/1945           MRN: [id number] Visit Date: 10/15/2023 PCP: Shelda Atlas, MD   Assessment & Plan:  Chief Complaint:  Chief Complaint  Patient presents with   Right Shoulder - Routine Post Op     RIGHT RSA, BT (surgery date 07-15-23)      Visit Diagnoses:  1. History of arthroplasty of right shoulder     Plan: Ginny is a patient is now 3 months out right reverse shoulder replacement.  Case manager present.  Patient doing therapy 2 times a week.  Range of motion is getting better but it still is difficult for her to reach out at times.  She still using the CPM but I think we can discontinue that based on passive range of motion.  On examination she does have functional deltoid including the anterior deltoid.  Subscap strength is also about 5 out of 5 with external rotation strength 4 out of 5.  She has passive forward flexion and abduction both above 90.  Actively can get to about 90 degrees.  Plan at this time is to continue physical therapy 2 times a week for 4 weeks to really focus on strengthening and then 1 time a week for 4 weeks.  Return to clinic in 2 months for final check.  Okay for her to return to work half days for 3 weeks with no overhead lifting for 3 months.  No lifting greater than 5 pounds with the right arm for the next 3 months as well.  Follow-Up Instructions: Return in about 2 months (around 12/13/2023).   Orders:  No orders of the defined types were placed in this encounter.  No orders of the defined types were placed in this encounter.   Imaging: No results found.  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   Trigger finger, right middle finger 04/17/2021   Primary osteoarthritis of right knee  12/11/2019   Primary osteoarthritis of left knee 12/11/2019   Bilateral primary osteoarthritis of knee 02/28/2019   Trochanteric bursitis of left hip 10/06/2018   Morbid obesity (HCC) 08/05/2018   Body mass index 40.0-44.9, adult (HCC) 08/05/2018   Chondromalacia, right knee    Positive ANA (antinuclear antibody) 06/14/2018   Chronic pain of right knee 06/14/2018   Acute medial meniscus tear, right, initial encounter 06/14/2018   Past Medical History:  Diagnosis Date   Arthritis    Chronic fatigue syndrome    Constipation    Depression    Dysphagia    GERD (gastroesophageal reflux disease)    Glaucoma    Dr Octavia   High cholesterol    History of hiatal hernia    Hypertension    IDA (iron deficiency anemia)    Hx   MMT (medial meniscus tear)    right   Obesity    Vertigo     Family History  Problem Relation Age of Onset   Diabetes Mother    Heart failure Mother    Heart failure Father    Colon cancer Neg Hx    Rectal cancer Neg Hx    Stomach cancer Neg Hx    Esophageal cancer Neg Hx  Liver cancer Neg Hx    Pancreatic cancer Neg Hx     Past Surgical History:  Procedure Laterality Date   BICEPT TENODESIS Right 07/15/2023   Procedure: BICEPS TENODESIS;  Surgeon: Addie Cordella Hamilton, MD;  Location: [facility] OR;  Service: Orthopedics;  Laterality: Right;   CHOLECYSTECTOMY     COLONOSCOPY  09/19/2013   Dr Dyane Ee GI  The entire examined colon is normal   DILATATION & CURETTAGE/HYSTEROSCOPY WITH MYOSURE N/A 11/09/2022   Procedure: DILATATION & CURETTAGE/HYSTEROSCOPY WITH MYOSURE;  Surgeon: Johnnye Ade, MD;  Location: [facility];  Service: Gynecology;  Laterality: N/A;   KNEE ARTHROSCOPY WITH MEDIAL MENISECTOMY Right 06/22/2018   Procedure: RIGHT KNEE ARTHROSCOPY WITH MEDIAL MENISCECTOMY, CHONDROPLASTY;  Surgeon: Jerri Kay HERO, MD;  Location: [facility];  Service: Orthopedics;  Laterality: Right;   REVERSE SHOULDER ARTHROPLASTY Right  07/15/2023   Procedure: REVERSE SHOULDER ARTHROPLASTY;  Surgeon: Addie Cordella Hamilton, MD;  Location: [facility] OR;  Service: Orthopedics;  Laterality: Right;   SHOULDER ARTHROSCOPY     removal of bone spur   TONSILLECTOMY     TUBAL LIGATION  2000   Social History   Occupational History   Not on file  Tobacco Use   Smoking status: Never   Smokeless tobacco: Never   Tobacco comments:    Quit smoking at 78 years old (smoked for 10 years)  Vaping Use   Vaping status: Never Used  Substance and Sexual Activity   Alcohol use: Not Currently    Comment: rare   Drug use: Never   Sexual activity: Yes    Birth control/protection: Post-menopausal

## 2023-10-26 ENCOUNTER — Ambulatory Visit: Payer: Medicare PPO | Admitting: Orthopaedic Surgery

## 2023-10-31 NOTE — Progress Notes (Deleted)
   Procedure Note  Patient: Shawna Thompson             Date of Birth: 06/02/1946           MRN: [id number]             Visit Date: 11/02/2023  Procedures: Visit Diagnoses:  1. Primary osteoarthritis of right knee     No procedures performed

## 2023-11-02 ENCOUNTER — Ambulatory Visit: Payer: Medicare PPO | Admitting: Orthopaedic Surgery

## 2023-11-02 DIAGNOSIS — M1711 Unilateral primary osteoarthritis, right knee: Secondary | ICD-10-CM

## 2023-11-09 ENCOUNTER — Ambulatory Visit: Payer: Medicare HMO | Admitting: Orthopedic Surgery

## 2023-12-10 ENCOUNTER — Ambulatory Visit (INDEPENDENT_AMBULATORY_CARE_PROVIDER_SITE_OTHER): Payer: Self-pay | Admitting: Orthopedic Surgery

## 2023-12-10 DIAGNOSIS — Z96611 Presence of right artificial shoulder joint: Secondary | ICD-10-CM | POA: Diagnosis not present

## 2023-12-11 ENCOUNTER — Encounter: Payer: Self-pay | Admitting: Orthopedic Surgery

## 2023-12-11 NOTE — Progress Notes (Signed)
 Office Visit Note   Patient: Shawna Thompson           Date of Birth: [birth year]           MRN: [id number] Visit Date: 12/10/2023 Requested by: Fleet Contras, MD [address] [place],  [place] [address] PCP: Fleet Contras, MD  Subjective: Chief Complaint  Patient presents with   Right Shoulder - Pain, Follow-up    HPI: Shawna Thompson is a 78 y.o. female who presents to the office reporting mild right shoulder symptoms from reverse shoulder replacement performed 07/15/2023.  Overall however Shawna Thompson is doing well..  She does not report any pain.  Has occasional numbness and tingling in the fingers in both hands.  Finished physical therapy last week.  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  She is 5 months out from surgery.              ROS: All systems reviewed are negative as they relate to the chief complaint within the history of present illness.  Patient denies fevers or chills.  Assessment & Plan: Visit Diagnoses:  1. History of arthroplasty of right shoulder     Plan: Impression is patient is doing well following shoulder replacement.  Current range of motion is overhead.  She is able to get her hand behind her head.  At this time she is okay for regular duty with no restrictions.  She has slight weakness and some limitation of motion.  She is rated 20% of the right shoulder.  Permanent partial disability.  No restrictions from work at this time.  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  Follow-up as needed.  Follow-Up Instructions: No follow-ups on file.   Orders:  No orders of the defined types were placed in this encounter.  No orders of the defined types were placed in this encounter.     Procedures: No procedures performed   Clinical Data: No additional findings.  Objective: Vital Signs: There were no vitals taken for this visit.  Physical Exam:  Constitutional: Patient appears well-developed HEENT:  Head:  Normocephalic Eyes:EOM are normal Neck: Normal range of motion Cardiovascular: Normal rate Pulmonary/chest: Effort normal Neurologic: Patient is alert Skin: Skin is warm Psychiatric: Patient has normal mood and affect  Ortho Exam: Ortho exam demonstrates range of motion on the right 50/90/130.  She has slight weakness to internal/external rotation of 4-5 on the right compared to 5 out of 5 on the left.  Pretty seamless range of motion passively.  Deltoid fires.  Incision intact.  Negative Tinel's cubital tunnel bilateral hands.  Does have positive carpal tunnel compression testing more on the right than the left.  Specialty Comments:  No specialty comments available.  Imaging: No results found.   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   Trigger finger, right middle finger 04/17/2021   Primary osteoarthritis of right knee 12/11/2019   Primary osteoarthritis of left knee 12/11/2019   Bilateral primary osteoarthritis of knee 02/28/2019   Trochanteric bursitis of left hip 10/06/2018   Morbid obesity (HCC) 08/05/2018   Body mass index 40.0-44.9, adult (HCC) 08/05/2018   Chondromalacia, right knee    Positive ANA (antinuclear antibody) 06/14/2018   Chronic pain of right knee 06/14/2018   Acute medial meniscus tear, right, initial  encounter 06/14/2018   Past Medical History:  Diagnosis Date   Arthritis    Chronic fatigue syndrome    Constipation    Depression    Dysphagia    GERD (gastroesophageal reflux disease)    Glaucoma    Dr Dione Booze   High cholesterol    History of hiatal hernia    Hypertension    IDA (iron deficiency anemia)    Hx   MMT (medial meniscus tear)    right   Obesity    Vertigo     Family History  Problem Relation Age of Onset   Diabetes Mother    Heart failure Mother    Heart  failure Father    Colon cancer Neg Hx    Rectal cancer Neg Hx    Stomach cancer Neg Hx    Esophageal cancer Neg Hx    Liver cancer Neg Hx    Pancreatic cancer Neg Hx     Past Surgical History:  Procedure Laterality Date   BICEPT TENODESIS Right 07/15/2023   Procedure: BICEPS TENODESIS;  Surgeon: Cammy Copa, MD;  Location: [facility] OR;  Service: Orthopedics;  Laterality: Right;   CHOLECYSTECTOMY     COLONOSCOPY  09/19/2013   Dr Barrett Shell GI  The entire examined colon is normal   DILATATION & CURETTAGE/HYSTEROSCOPY WITH MYOSURE N/A 11/09/2022   Procedure: DILATATION & CURETTAGE/HYSTEROSCOPY WITH MYOSURE;  Surgeon: Richarda Overlie, MD;  Location: [facility];  Service: Gynecology;  Laterality: N/A;   KNEE ARTHROSCOPY WITH MEDIAL MENISECTOMY Right 06/22/2018   Procedure: RIGHT KNEE ARTHROSCOPY WITH MEDIAL MENISCECTOMY, CHONDROPLASTY;  Surgeon: Tarry Kos, MD;  Location: [facility];  Service: Orthopedics;  Laterality: Right;   REVERSE SHOULDER ARTHROPLASTY Right 07/15/2023   Procedure: REVERSE SHOULDER ARTHROPLASTY;  Surgeon: Cammy Copa, MD;  Location: [facility] OR;  Service: Orthopedics;  Laterality: Right;   SHOULDER ARTHROSCOPY     removal of bone spur   TONSILLECTOMY     TUBAL LIGATION  2000   Social History   Occupational History   Not on file  Tobacco Use   Smoking status: Never   Smokeless tobacco: Never   Tobacco comments:    Quit smoking at 78 years old (smoked for 10 years)  Vaping Use   Vaping status: Never Used  Substance and Sexual Activity   Alcohol use: Not Currently    Comment: rare   Drug use: Never   Sexual activity: Yes    Birth control/protection: Post-menopausal

## 2024-06-20 ENCOUNTER — Ambulatory Visit: Admitting: Orthopaedic Surgery

## 2024-06-20 DIAGNOSIS — G5601 Carpal tunnel syndrome, right upper limb: Secondary | ICD-10-CM | POA: Diagnosis not present

## 2024-06-20 DIAGNOSIS — G5602 Carpal tunnel syndrome, left upper limb: Secondary | ICD-10-CM | POA: Insufficient documentation

## 2024-06-20 DIAGNOSIS — G5603 Carpal tunnel syndrome, bilateral upper limbs: Secondary | ICD-10-CM

## 2024-06-20 NOTE — Progress Notes (Signed)
 Office Visit Note   Patient: Shawna Thompson           Date of Birth: Jul 12, 1946           MRN: [id number] Visit Date: 06/20/2024              Requested by: Shelda Atlas, MD [address] [place],  [place] [address] PCP: Shelda Atlas, MD   Assessment & Plan: Visit Diagnoses:  1. Right carpal tunnel syndrome   2. Left carpal tunnel syndrome     Plan: History of Present Illness Shawna Thompson is a 78 year old female who presents with numbness, tingling, and burning in both hands.  She experiences numbness, tingling, and burning sensations in both hands, severe enough to disrupt sleep. These symptoms are distinct from her previous trigger fingers. Numbness and tingling are primarily in the fingers, with a brace alleviating most symptoms, though pain persists in one finger. Symptoms worsen when driving or holding objects like a phone, spoon, fork, pen, or pencil.  She has a painful knot on her right hand, suspected to be a ganglion cyst, and seeks removal due to discomfort.  Physical Exam MSK: Positive carpal tunnel compressive signs.  Normal cap refill.  Ganglion cyst on dorsum of right hand.  Assessment and Plan Bilateral carpal tunnel syndrome Suspected bilateral carpal tunnel syndrome with symptoms of numbness, tingling, and pain in both hands. The reproduction of symptoms with carpal tunnel compression supports diagnosis. Carpal tunnel syndrome is most likely. - Order nerve conduction study to confirm diagnosis and assess severity. - Provide better fitting wrist braces for nighttime use. - Schedule follow-up after nerve conduction study results.  Right hand ganglion cyst Presence of a painful ganglion cyst on the right hand causing discomfort. - Discussed surgical removal if cyst becomes more bothersome.  Follow-Up Instructions: No follow-ups on file.   Orders:  Orders Placed This Encounter  Procedures   Ambulatory referral to Physical Medicine Rehab   No  orders of the defined types were placed in this encounter.     Procedures: No procedures performed   Clinical Data: No additional findings.   Subjective: Chief Complaint  Patient presents with   Right Hand - Pain   Left Hand - Pain    HPI  Review of Systems  Constitutional: Negative.   HENT: Negative.    Eyes: Negative.   Respiratory: Negative.    Cardiovascular: Negative.   Endocrine: Negative.   Musculoskeletal: Negative.   Neurological: Negative.   Hematological: Negative.   Psychiatric/Behavioral: Negative.    All other systems reviewed and are negative.    Objective: Vital Signs: There were no vitals taken for this visit.  Physical Exam Vitals and nursing note reviewed.  Constitutional:      Appearance: She is well-developed.  HENT:     Head: Atraumatic.     Nose: Nose normal.  Eyes:     Extraocular Movements: Extraocular movements intact.  Cardiovascular:     Pulses: Normal pulses.  Pulmonary:     Effort: Pulmonary effort is normal.  Abdominal:     Palpations: Abdomen is soft.  Musculoskeletal:     Cervical back: Neck supple.  Skin:    General: Skin is warm.     Capillary Refill: Capillary refill takes less than 2 seconds.  Neurological:     Mental Status: She is alert. Mental status is at baseline.  Psychiatric:        Behavior: Behavior normal.  Thought Content: Thought content normal.        Judgment: Judgment normal.     Ortho Exam  Specialty Comments:  No specialty comments available.  Imaging: No results found.   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   Trigger finger, right middle finger 04/17/2021   Primary osteoarthritis of right knee 12/11/2019   Primary  osteoarthritis of left knee 12/11/2019   Bilateral primary osteoarthritis of knee 02/28/2019   Trochanteric bursitis of left hip 10/06/2018   Morbid obesity (HCC) 08/05/2018   Body mass index 40.0-44.9, adult (HCC) 08/05/2018   Chondromalacia, right knee    Positive ANA (antinuclear antibody) 06/14/2018   Chronic pain of right knee 06/14/2018   Acute medial meniscus tear, right, initial encounter 06/14/2018   Past Medical History:  Diagnosis Date   Arthritis    Chronic fatigue syndrome    Constipation    Depression    Dysphagia    GERD (gastroesophageal reflux disease)    Glaucoma    Dr Octavia   High cholesterol    History of hiatal hernia    Hypertension    IDA (iron deficiency anemia)    Hx   MMT (medial meniscus tear)    right   Obesity    Vertigo     Family History  Problem Relation Age of Onset   Diabetes Mother    Heart failure Mother    Heart failure Father    Colon cancer Neg Hx    Rectal cancer Neg Hx    Stomach cancer Neg Hx    Esophageal cancer Neg Hx    Liver cancer Neg Hx    Pancreatic cancer Neg Hx     Past Surgical History:  Procedure Laterality Date   BICEPT TENODESIS Right 07/15/2023   Procedure: BICEPS TENODESIS;  Surgeon: Addie Cordella Hamilton, MD;  Location: [facility] OR;  Service: Orthopedics;  Laterality: Right;   CHOLECYSTECTOMY     COLONOSCOPY  09/19/2013   Dr Dyane Ee GI  The entire examined colon is normal   DILATATION & CURETTAGE/HYSTEROSCOPY WITH MYOSURE N/A 11/09/2022   Procedure: DILATATION & CURETTAGE/HYSTEROSCOPY WITH MYOSURE;  Surgeon: Johnnye Ade, MD;  Location: [facility];  Service: Gynecology;  Laterality: N/A;   KNEE ARTHROSCOPY WITH MEDIAL MENISECTOMY Right 06/22/2018   Procedure: RIGHT KNEE ARTHROSCOPY WITH MEDIAL MENISCECTOMY, CHONDROPLASTY;  Surgeon: Jerri Kay HERO, MD;  Location: [facility];  Service: Orthopedics;  Laterality: Right;   REVERSE SHOULDER ARTHROPLASTY Right 07/15/2023   Procedure:  REVERSE SHOULDER ARTHROPLASTY;  Surgeon: Addie Cordella Hamilton, MD;  Location: [facility] OR;  Service: Orthopedics;  Laterality: Right;   SHOULDER ARTHROSCOPY     removal of bone spur   TONSILLECTOMY     TUBAL LIGATION  2000   Social History   Occupational History   Not on file  Tobacco Use   Smoking status: Never   Smokeless tobacco: Never   Tobacco comments:    Quit smoking at 78 years old (smoked for 10 years)  Vaping Use   Vaping status: Never Used  Substance and Sexual Activity   Alcohol use: Not Currently    Comment: rare   Drug use: Never   Sexual activity: Yes    Birth control/protection: Post-menopausal

## 2024-07-04 ENCOUNTER — Ambulatory Visit: Admitting: Physical Medicine and Rehabilitation

## 2024-07-04 ENCOUNTER — Encounter: Payer: Self-pay | Admitting: Physical Medicine and Rehabilitation

## 2024-07-04 DIAGNOSIS — R202 Paresthesia of skin: Secondary | ICD-10-CM

## 2024-07-04 DIAGNOSIS — M79645 Pain in left finger(s): Secondary | ICD-10-CM

## 2024-07-04 DIAGNOSIS — R29898 Other symptoms and signs involving the musculoskeletal system: Secondary | ICD-10-CM | POA: Diagnosis not present

## 2024-07-04 DIAGNOSIS — M79644 Pain in right finger(s): Secondary | ICD-10-CM

## 2024-07-04 NOTE — Progress Notes (Signed)
 Pain Scale   Average Pain 8 Patient adviaing she is Right hand dominate. Patient advising she had numbness and tingling bilaterally in hands with some weakness, more so on the left hand.        +Driver, -BT, -Dye Allergies.

## 2024-07-04 NOTE — Progress Notes (Signed)
 Shawna Thompson - 78 y.o. female MRN [id number]  Date of birth: 12-Jul-1946  Office Visit Note: Visit Date: 07/04/2024 PCP: Shelda Atlas, MD Referred by: Jerri Kay HERO, MD  Subjective: Chief Complaint  Patient presents with   Left Hand - Pain, Numbness   Right Hand - Pain, Numbness   HPI: Shawna Thompson is a 78 y.o. female who comes in today at the request of Dr. Ozell Jerri for evaluation and management of chronic, worsening and severe pain, numbness and tingling in the Bilateral upper extremities.  Patient is Right hand dominant.  Her biggest complaint is severe pain numbness and tingling in the left hand and all the digits globally.  This radiates up into the forearm.  She does not really have any elbow pain.  No radicular symptoms down the arm.  She also gets pain and tingling in the right hand just not nearly as bad.  It seems to worsen with holding objects like her folder utensils or doing driving.  She does get nocturnal complaints in both hands.  She has had a history of trigger finger release and feels like this is different.  She actually went to the emergency room at some point because the pain was so bad on the left.  She has been wearing a wrist splint that seems to help while its in place.  She moves her left hand constantly in the office.  He has not had a history of cervical neck surgery or problems.  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  She also shows me a ganglion cyst on the dorsum of the right hand.  She feels that this is very painful at times.   I spent more than 30 minutes speaking face-to-face with the patient with 50% of the time in counseling and discussing coordination of care.       Review of Systems  Musculoskeletal:  Positive for joint pain.  Neurological:  Positive for tingling and weakness.  All other systems reviewed and are negative.  Otherwise per HPI.  Assessment & Plan: Visit Diagnoses:    ICD-10-CM   1.  Paresthesia of skin  R20.2 NCV with EMG (electromyography)    2. Thumb pain, right  M79.644     3. Pain in left finger(s)  M79.645     4. Weakness of both hands  R29.898        Plan: Impression: Clinically she likely has some level of carpal tunnel syndrome bilaterally and clearly has ganglion cyst on the dorsum of the right hand which could be painful.  She is also got musculoskeletal complaints and likely osteoarthritis pain.  The above electrodiagnostic study is ABNORMAL and reveals evidence of:  a moderate left median nerve entrapment at the wrist (carpal tunnel syndrome) affecting sensory and motor components.   a mild right median nerve entrapment at the wrist (carpal tunnel syndrome) affecting sensory components.   There is no significant electrodiagnostic evidence of any other focal nerve entrapment, brachial plexopathy or cervical radiculopathy.    Recommendations: 1.  Follow-up with referring physician. 2.  Continue current management of symptoms. 3.  Continue use of resting splint at night-time and as needed during the day.  Meds & Orders: No orders of the defined types were placed in this encounter.   Orders Placed This Encounter  Procedures   NCV with EMG (electromyography)    Follow-up: Return for Ozell Jerri, MD.   Procedures: No procedures performed  EMG &  NCV Findings: Evaluation of the left median motor nerve showed prolonged distal onset latency (4.3 ms).  The right ulnar motor nerve showed decreased conduction velocity (B Elbow-Wrist, 51 m/s).  The left median (across palm) sensory nerve showed prolonged distal peak latency (Wrist, 4.9 ms) and prolonged distal peak latency (Palm, 2.1 ms).  The right median (across palm) sensory nerve showed prolonged distal peak latency (Wrist, 4.1 ms).  The left ulnar sensory and the right ulnar sensory nerves showed reduced amplitude (L8.7, R11.5 V).  All remaining nerves (as indicated in the following tables) were within  normal limits.  Left vs. Right side comparison data for the ulnar motor nerve indicates abnormal L-R amplitude difference (34.5 %).  All remaining left vs. right side differences were within normal limits.    All examined muscles (as indicated in the following table) showed no evidence of electrical instability.    Impression: The above electrodiagnostic study is ABNORMAL and reveals evidence of:  a moderate left median nerve entrapment at the wrist (carpal tunnel syndrome) affecting sensory and motor components.   a mild right median nerve entrapment at the wrist (carpal tunnel syndrome) affecting sensory components.   There is no significant electrodiagnostic evidence of any other focal nerve entrapment, brachial plexopathy or cervical radiculopathy.    Recommendations: 1.  Follow-up with referring physician. 2.  Continue current management of symptoms. 3.  Continue use of resting splint at night-time and as needed during the day.  ___________________________ Prentice Eldonna BETTERS Board Certified, American Board of Physical Medicine and Rehabilitation    Nerve Conduction Studies Anti Sensory Summary Table   Stim Site NR Peak (ms) Norm Peak (ms) P-T Amp (V) Norm P-T Amp Site1 Site2 Delta-P (ms) Dist (cm) Vel (m/s) Norm Vel (m/s)  Left Median Acr Palm Anti Sensory (2nd Digit)  32C  Wrist    *4.9 <3.6 25.0 >10 Wrist Palm 2.8 0.0    Palm    *2.1 <2.0 19.0         Right Median Acr Palm Anti Sensory (2nd Digit)  31.8C  Wrist    *4.1 <3.6 20.0 >10 Wrist Palm 2.2 0.0    Palm    1.9 <2.0 16.8         Left Radial Anti Sensory (Base 1st Digit)  32.1C  Wrist    2.0 <3.1 24.0  Wrist Base 1st Digit 2.0 0.0    Right Radial Anti Sensory (Base 1st Digit)  32C  Wrist    2.1 <3.1 22.4  Wrist Base 1st Digit 2.1 0.0    Left Ulnar Anti Sensory (5th Digit)  32.4C  Wrist    3.1 <3.7 *8.7 >15.0 Wrist 5th Digit 3.1 14.0 45 >38  Right Ulnar Anti Sensory (5th Digit)  32.3C  Wrist    3.2 <3.7 *11.5  >15.0 Wrist 5th Digit 3.2 14.0 44 >38   Motor Summary Table   Stim Site NR Onset (ms) Norm Onset (ms) O-P Amp (mV) Norm O-P Amp Site1 Site2 Delta-0 (ms) Dist (cm) Vel (m/s) Norm Vel (m/s)  Left Median Motor (Abd Poll Brev)  32.2C  Wrist    *4.3 <4.2 7.7 >5 Elbow Wrist 3.9 19.5 50 >50  Elbow    8.2  6.7         Right Median Motor (Abd Poll Brev)  32.1C  Wrist    3.8 <4.2 6.0 >5 Elbow Wrist 3.5 19.5 56 >50  Elbow    7.3  5.5  Left Ulnar Motor (Abd Dig Min)  32.5C  Wrist    3.0 <4.2 3.8 >3 B Elbow Wrist 3.0 18.0 60 >53  B Elbow    6.0  6.3  A Elbow B Elbow 1.4 10.0 71 >53  A Elbow    7.4  6.5         Right Ulnar Motor (Abd Dig Min)  32.2C  Wrist    2.7 <4.2 5.8 >3 B Elbow Wrist 3.6 18.5 *51 >53  B Elbow    6.3  5.8  A Elbow B Elbow 1.4 10.0 71 >53  A Elbow    7.7  6.2          EMG   Side Muscle Nerve Root Ins Act Fibs Psw Amp Dur Poly Recrt Int Bruna Comment  Left Abd Poll Brev Median C8-T1 Nml Nml Nml Nml Nml 0 Nml Nml   Left 1stDorInt Ulnar C8-T1 Nml Nml Nml Nml Nml 0 Nml Nml   Left PronatorTeres Median C6-7 Nml Nml Nml Nml Nml 0 Nml Nml   Left Biceps Musculocut C5-6 Nml Nml Nml Nml Nml 0 Nml Nml   Left Deltoid Axillary C5-6 Nml Nml Nml Nml Nml 0 Nml Nml     Nerve Conduction Studies Anti Sensory Left/Right Comparison   Stim Site L Lat (ms) R Lat (ms) L-R Lat (ms) L Amp (V) R Amp (V) L-R Amp (%) Site1 Site2 L Vel (m/s) R Vel (m/s) L-R Vel (m/s)  Median Acr Palm Anti Sensory (2nd Digit)  32C  Wrist *4.9 *4.1 0.8 25.0 20.0 20.0 Wrist Palm     Palm *2.1 1.9 0.2 19.0 16.8 11.6       Radial Anti Sensory (Base 1st Digit)  32.1C  Wrist 2.0 2.1 0.1 24.0 22.4 6.7 Wrist Base 1st Digit     Ulnar Anti Sensory (5th Digit)  32.4C  Wrist 3.1 3.2 0.1 *8.7 *11.5 24.3 Wrist 5th Digit 45 44 1   Motor Left/Right Comparison   Stim Site L Lat (ms) R Lat (ms) L-R Lat (ms) L Amp (mV) R Amp (mV) L-R Amp (%) Site1 Site2 L Vel (m/s) R Vel (m/s) L-R Vel (m/s)  Median Motor (Abd Poll  Brev)  32.2C  Wrist *4.3 3.8 0.5 7.7 6.0 22.1 Elbow Wrist 50 56 6  Elbow 8.2 7.3 0.9 6.7 5.5 17.9       Ulnar Motor (Abd Dig Min)  32.5C  Wrist 3.0 2.7 0.3 3.8 5.8 *34.5 B Elbow Wrist 60 *51 9  B Elbow 6.0 6.3 0.3 6.3 5.8 7.9 A Elbow B Elbow 71 71 0  A Elbow 7.4 7.7 0.3 6.5 6.2 4.6          Waveforms:                     Clinical History: No specialty comments available.   She reports that she has never smoked. She has never used smokeless tobacco. No results for input(s): HGBA1C, LABURIC in the last 8760 hours.  Objective:  VS:  HT:    WT:   BMI:     BP:   HR: bpm  TEMP: ( )  RESP:  Physical Exam Vitals and nursing note reviewed.  Constitutional:      General: She is not in acute distress.    Appearance: Normal appearance. She is well-developed. She is obese. She is not ill-appearing.  HENT:     Head: Normocephalic and atraumatic.  Eyes:     Conjunctiva/sclera: Conjunctivae  normal.     Pupils: Pupils are equal, round, and reactive to light.  Cardiovascular:     Rate and Rhythm: Normal rate.     Pulses: Normal pulses.  Pulmonary:     Effort: Pulmonary effort is normal.  Musculoskeletal:        General: No swelling, tenderness or deformity.     Right lower leg: No edema.     Left lower leg: No edema.     Comments: Inspection reveals right ganglion cyst on the dorsum of the hand but no atrophy of the bilateral APB or FDI or hand intrinsics. There is no swelling, color changes, allodynia or dystrophic changes. There is 5 out of 5 strength in the bilateral wrist extension, finger abduction and long finger flexion. There is intact sensation to light touch in all dermatomal and peripheral nerve distributions.  There is a negative Phalen's test bilaterally. There is a negative Hoffmann's test bilaterally.  Skin:    General: Skin is warm and dry.     Findings: No erythema or rash.  Neurological:     General: No focal deficit present.     Mental Status: She is  alert and oriented to person, place, and time.     Sensory: No sensory deficit.     Motor: No weakness or abnormal muscle tone.     Coordination: Coordination normal.     Gait: Gait normal.  Psychiatric:        Mood and Affect: Mood normal.        Behavior: Behavior normal.     Ortho Exam  Imaging: No results found.  Past Medical/Family/Surgical/Social History: Medications & Allergies reviewed per EMR, new medications updated. 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   Trigger finger, right middle finger 04/17/2021   Primary osteoarthritis of right knee 12/11/2019   Primary osteoarthritis of left knee 12/11/2019   Bilateral primary osteoarthritis of knee 02/28/2019   Trochanteric bursitis of left hip 10/06/2018   Morbid obesity (HCC) 08/05/2018   Body mass index 40.0-44.9, adult (HCC) 08/05/2018   Chondromalacia, right knee    Positive ANA (antinuclear antibody) 06/14/2018   Chronic pain of right knee 06/14/2018   Acute medial meniscus tear, right, initial encounter 06/14/2018   Past Medical History:  Diagnosis Date   Arthritis    Chronic fatigue syndrome    Constipation    Depression    Dysphagia    GERD (gastroesophageal reflux disease)    Glaucoma    Dr Octavia   High cholesterol    History of hiatal hernia    Hypertension    IDA (iron deficiency anemia)    Hx   MMT (medial meniscus tear)    right   Obesity    Vertigo    Family History  Problem Relation Age of Onset   Diabetes Mother    Heart failure Mother    Heart failure Father    Colon cancer Neg Hx    Rectal cancer Neg Hx    Stomach cancer Neg Hx    Esophageal cancer Neg Hx    Liver cancer Neg Hx    Pancreatic cancer Neg Hx    Past Surgical History:   Procedure Laterality Date   BICEPT TENODESIS Right 07/15/2023   Procedure:  BICEPS TENODESIS;  Surgeon: Addie Cordella Hamilton, MD;  Location: [facility] OR;  Service: Orthopedics;  Laterality: Right;   CHOLECYSTECTOMY     COLONOSCOPY  09/19/2013   Dr Dyane Ee GI  The entire examined colon is normal   DILATATION & CURETTAGE/HYSTEROSCOPY WITH MYOSURE N/A 11/09/2022   Procedure: DILATATION & CURETTAGE/HYSTEROSCOPY WITH MYOSURE;  Surgeon: Johnnye Ade, MD;  Location: [facility];  Service: Gynecology;  Laterality: N/A;   KNEE ARTHROSCOPY WITH MEDIAL MENISECTOMY Right 06/22/2018   Procedure: RIGHT KNEE ARTHROSCOPY WITH MEDIAL MENISCECTOMY, CHONDROPLASTY;  Surgeon: Jerri Kay HERO, MD;  Location: [facility];  Service: Orthopedics;  Laterality: Right;   REVERSE SHOULDER ARTHROPLASTY Right 07/15/2023   Procedure: REVERSE SHOULDER ARTHROPLASTY;  Surgeon: Addie Cordella Hamilton, MD;  Location: [facility] OR;  Service: Orthopedics;  Laterality: Right;   SHOULDER ARTHROSCOPY     removal of bone spur   TONSILLECTOMY     TUBAL LIGATION  2000   Social History   Occupational History   Not on file  Tobacco Use   Smoking status: Never   Smokeless tobacco: Never   Tobacco comments:    Quit smoking at 78 years old (smoked for 10 years)  Vaping Use   Vaping status: Never Used  Substance and Sexual Activity   Alcohol use: Not Currently    Comment: rare   Drug use: Never   Sexual activity: Yes    Birth control/protection: Post-menopausal

## 2024-07-04 NOTE — Procedures (Signed)
 EMG & NCV Findings: Evaluation of the left median motor nerve showed prolonged distal onset latency (4.3 ms).  The right ulnar motor nerve showed decreased conduction velocity (B Elbow-Wrist, 51 m/s).  The left median (across palm) sensory nerve showed prolonged distal peak latency (Wrist, 4.9 ms) and prolonged distal peak latency (Palm, 2.1 ms).  The right median (across palm) sensory nerve showed prolonged distal peak latency (Wrist, 4.1 ms).  The left ulnar sensory and the right ulnar sensory nerves showed reduced amplitude (L8.7, R11.5 V).  All remaining nerves (as indicated in the following tables) were within normal limits.  Left vs. Right side comparison data for the ulnar motor nerve indicates abnormal L-R amplitude difference (34.5 %).  All remaining left vs. right side differences were within normal limits.    All examined muscles (as indicated in the following table) showed no evidence of electrical instability.    Impression: The above electrodiagnostic study is ABNORMAL and reveals evidence of:  a moderate left median nerve entrapment at the wrist (carpal tunnel syndrome) affecting sensory and motor components.   a mild right median nerve entrapment at the wrist (carpal tunnel syndrome) affecting sensory components.   There is no significant electrodiagnostic evidence of any other focal nerve entrapment, brachial plexopathy or cervical radiculopathy.    Recommendations: 1.  Follow-up with referring physician. 2.  Continue current management of symptoms. 3.  Continue use of resting splint at night-time and as needed during the day.  ___________________________ Prentice Eldonna BETTERS Board Certified, American Board of Physical Medicine and Rehabilitation    Nerve Conduction Studies Anti Sensory Summary Table   Stim Site NR Peak (ms) Norm Peak (ms) P-T Amp (V) Norm P-T Amp Site1 Site2 Delta-P (ms) Dist (cm) Vel (m/s) Norm Vel (m/s)  Left Median Acr Palm Anti Sensory (2nd Digit)   32C  Wrist    *4.9 <3.6 25.0 >10 Wrist Palm 2.8 0.0    Palm    *2.1 <2.0 19.0         Right Median Acr Palm Anti Sensory (2nd Digit)  31.8C  Wrist    *4.1 <3.6 20.0 >10 Wrist Palm 2.2 0.0    Palm    1.9 <2.0 16.8         Left Radial Anti Sensory (Base 1st Digit)  32.1C  Wrist    2.0 <3.1 24.0  Wrist Base 1st Digit 2.0 0.0    Right Radial Anti Sensory (Base 1st Digit)  32C  Wrist    2.1 <3.1 22.4  Wrist Base 1st Digit 2.1 0.0    Left Ulnar Anti Sensory (5th Digit)  32.4C  Wrist    3.1 <3.7 *8.7 >15.0 Wrist 5th Digit 3.1 14.0 45 >38  Right Ulnar Anti Sensory (5th Digit)  32.3C  Wrist    3.2 <3.7 *11.5 >15.0 Wrist 5th Digit 3.2 14.0 44 >38   Motor Summary Table   Stim Site NR Onset (ms) Norm Onset (ms) O-P Amp (mV) Norm O-P Amp Site1 Site2 Delta-0 (ms) Dist (cm) Vel (m/s) Norm Vel (m/s)  Left Median Motor (Abd Poll Brev)  32.2C  Wrist    *4.3 <4.2 7.7 >5 Elbow Wrist 3.9 19.5 50 >50  Elbow    8.2  6.7         Right Median Motor (Abd Poll Brev)  32.1C  Wrist    3.8 <4.2 6.0 >5 Elbow Wrist 3.5 19.5 56 >50  Elbow    7.3  5.5  Left Ulnar Motor (Abd Dig Min)  32.5C  Wrist    3.0 <4.2 3.8 >3 B Elbow Wrist 3.0 18.0 60 >53  B Elbow    6.0  6.3  A Elbow B Elbow 1.4 10.0 71 >53  A Elbow    7.4  6.5         Right Ulnar Motor (Abd Dig Min)  32.2C  Wrist    2.7 <4.2 5.8 >3 B Elbow Wrist 3.6 18.5 *51 >53  B Elbow    6.3  5.8  A Elbow B Elbow 1.4 10.0 71 >53  A Elbow    7.7  6.2          EMG   Side Muscle Nerve Root Ins Act Fibs Psw Amp Dur Poly Recrt Int Bruna Comment  Left Abd Poll Brev Median C8-T1 Nml Nml Nml Nml Nml 0 Nml Nml   Left 1stDorInt Ulnar C8-T1 Nml Nml Nml Nml Nml 0 Nml Nml   Left PronatorTeres Median C6-7 Nml Nml Nml Nml Nml 0 Nml Nml   Left Biceps Musculocut C5-6 Nml Nml Nml Nml Nml 0 Nml Nml   Left Deltoid Axillary C5-6 Nml Nml Nml Nml Nml 0 Nml Nml     Nerve Conduction Studies Anti Sensory Left/Right Comparison   Stim Site L Lat (ms) R Lat (ms) L-R Lat  (ms) L Amp (V) R Amp (V) L-R Amp (%) Site1 Site2 L Vel (m/s) R Vel (m/s) L-R Vel (m/s)  Median Acr Palm Anti Sensory (2nd Digit)  32C  Wrist *4.9 *4.1 0.8 25.0 20.0 20.0 Wrist Palm     Palm *2.1 1.9 0.2 19.0 16.8 11.6       Radial Anti Sensory (Base 1st Digit)  32.1C  Wrist 2.0 2.1 0.1 24.0 22.4 6.7 Wrist Base 1st Digit     Ulnar Anti Sensory (5th Digit)  32.4C  Wrist 3.1 3.2 0.1 *8.7 *11.5 24.3 Wrist 5th Digit 45 44 1   Motor Left/Right Comparison   Stim Site L Lat (ms) R Lat (ms) L-R Lat (ms) L Amp (mV) R Amp (mV) L-R Amp (%) Site1 Site2 L Vel (m/s) R Vel (m/s) L-R Vel (m/s)  Median Motor (Abd Poll Brev)  32.2C  Wrist *4.3 3.8 0.5 7.7 6.0 22.1 Elbow Wrist 50 56 6  Elbow 8.2 7.3 0.9 6.7 5.5 17.9       Ulnar Motor (Abd Dig Min)  32.5C  Wrist 3.0 2.7 0.3 3.8 5.8 *34.5 B Elbow Wrist 60 *51 9  B Elbow 6.0 6.3 0.3 6.3 5.8 7.9 A Elbow B Elbow 71 71 0  A Elbow 7.4 7.7 0.3 6.5 6.2 4.6          Waveforms:

## 2024-08-07 ENCOUNTER — Encounter: Payer: Self-pay | Admitting: Radiology

## 2024-08-09 ENCOUNTER — Ambulatory Visit: Admitting: Orthopaedic Surgery

## 2024-08-09 ENCOUNTER — Other Ambulatory Visit: Payer: Self-pay

## 2024-08-09 ENCOUNTER — Encounter: Payer: Self-pay | Admitting: Orthopaedic Surgery

## 2024-08-09 DIAGNOSIS — G5603 Carpal tunnel syndrome, bilateral upper limbs: Secondary | ICD-10-CM

## 2024-08-09 DIAGNOSIS — G5602 Carpal tunnel syndrome, left upper limb: Secondary | ICD-10-CM | POA: Diagnosis not present

## 2024-08-09 DIAGNOSIS — G5601 Carpal tunnel syndrome, right upper limb: Secondary | ICD-10-CM | POA: Diagnosis not present

## 2024-08-09 MED ORDER — METHYLPREDNISOLONE ACETATE 40 MG/ML IJ SUSP
40.0000 mg | INTRAMUSCULAR | Status: AC | PRN
  Administered 2024-08-09: 40 mg

## 2024-08-09 MED ORDER — LIDOCAINE HCL 1 % IJ SOLN
1.0000 mL | INTRAMUSCULAR | Status: AC | PRN
  Administered 2024-08-09: 1 mL

## 2024-08-09 MED ORDER — BUPIVACAINE HCL 0.5 % IJ SOLN
1.0000 mL | INTRAMUSCULAR | Status: AC | PRN
  Administered 2024-08-09: 1 mL

## 2024-08-09 NOTE — Progress Notes (Signed)
 Office Visit Note   Patient: Shawna Thompson           Date of Birth: 1946/08/31           MRN: [id number] Visit Date: 08/09/2024              Requested by: Shelda Atlas, MD [address] [place],  [place] [address] PCP: Shelda Atlas, MD   Assessment & Plan: Visit Diagnoses:  1. Left carpal tunnel syndrome   2. Right carpal tunnel syndrome     Plan: Patient here today to follow-up for moderate left carpal tunnel syndrome and mild right carpal tunnel syndrome verified via EMG report.  After shared decision making patient elected to undergo ultrasound-guided injection of left carpal tunnel completed during today's office visit. Patient tolerated procedure well and endorsed pain relief immediately following procedure. If symptoms return she will follow-up in the new year for scheduling surgical release of left carpal tunnel.    Since right sided carpal tunnel symptoms are mild at this time she deferred any treatment today.  Will return for right sided cortisone injection if symptoms get worse over upcoming weeks/months.  Follow-Up Instructions: No follow-ups on file.   Orders:  Orders Placed This Encounter  Procedures   US  Guided Needle Placement - No Linked Charges   No orders of the defined types were placed in this encounter.   Procedure Note  Hand/UE Inj: L carpal tunnel for carpal tunnel syndrome on 08/09/2024 1:44 PM Indications: pain Details: 25 G needle, ultrasound-guided volar approach Medications: 1 mL lidocaine  1 %; 1 mL bupivacaine  0.5 %; 40 mg methylPREDNISolone  acetate 40 MG/ML Outcome: tolerated well, no immediate complications Patient was prepped and draped in the usual sterile fashion.          Procedures: Procedure: US -guided Carpal Tunnel Injection, Left Wrist After informed verbal consent and discussion on R/B/I, a timeout was performed, patient was seated on exam table with the affected arm placed in supine position on table. The area  overlying the patient's carpal tunnel was prepped with Betadine  and alcohol swabs  then utilizing ultrasound guidance via an in-plane approach, the patient's carpal tunnel was injected with a mixture of 1:1:1 depomedrol:lidocaine :marcaine  with hydrodissection of the median nerve from the flexor retinaculum in 360 degree fashion. Visualization of the needle was achieved with a transverse, in-plane approach. Patient tolerated procedure well without immediate complications.  Clinical Data: No additional findings.   Subjective: Chief Complaint  Patient presents with   Left Hand - Follow-up    EMG review   Right Hand - Follow-up    EMG review    HPI  Patient here today to follow-up for bilateral carpal tunnel syndromes after undergoing EMG with Dr. Eldonna.  EMG showed moderate left carpal tunnel syndrome and mild right carpal tunnel syndrome.  Patient endorses pain in her left hand and has been getting worse she is having almost constant pain/numbness/tingling of her first 3 digits on her left hand.  Has been consistent about wearing a wrist brace but this is no longer making a significant difference.  Interested in injection versus carpal tunnel release, however would not like to have surgery until after the new year when the holidays are over.  Review of Systems  Constitutional: Negative.   HENT: Negative.    Eyes: Negative.   Respiratory: Negative.    Cardiovascular: Negative.   Endocrine: Negative.   Musculoskeletal: Negative.   Neurological: Negative.   Hematological: Negative.   Psychiatric/Behavioral: Negative.  All other systems reviewed and are negative.   ROS negative except as noted in HPI above   Objective: Vital Signs: There were no vitals taken for this visit.  Physical Exam Vitals and nursing note reviewed.  Constitutional:      Appearance: She is well-developed.  HENT:     Head: Atraumatic.     Nose: Nose normal.  Eyes:     Extraocular Movements: Extraocular  movements intact.  Cardiovascular:     Pulses: Normal pulses.  Pulmonary:     Effort: Pulmonary effort is normal.  Abdominal:     Palpations: Abdomen is soft.  Musculoskeletal:     Cervical back: Neck supple.  Skin:    General: Skin is warm.     Capillary Refill: Capillary refill takes less than 2 seconds.  Neurological:     Mental Status: She is alert. Mental status is at baseline.  Psychiatric:        Behavior: Behavior normal.        Thought Content: Thought content normal.        Judgment: Judgment normal.     Ortho Exam  Exam consistent with bilateral carpal tunnel syndrome with pain/numbness/tingling of first 3 digits of bilateral hands, left worse than right.  Tinel sign positive bilaterally.  Phalen sign positive bilaterally.  No evidence of thenar atrophy of bilateral hands.  Specialty Comments:  No specialty comments available.  Imaging: No results found.   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   Trigger finger, right middle finger 04/17/2021   Primary osteoarthritis of right knee 12/11/2019   Primary osteoarthritis of left knee 12/11/2019   Bilateral primary osteoarthritis of knee 02/28/2019   Trochanteric bursitis of left hip 10/06/2018   Morbid obesity (HCC) 08/05/2018   Body mass index 40.0-44.9, adult (HCC) 08/05/2018   Chondromalacia, right knee    Positive ANA (antinuclear antibody) 06/14/2018   Chronic pain of right knee 06/14/2018   Acute medial meniscus tear, right, initial encounter 06/14/2018   Past Medical History:  Diagnosis Date   Arthritis    Chronic fatigue syndrome    Constipation    Depression    Dysphagia    GERD (gastroesophageal reflux disease)    Glaucoma    Dr Octavia    High cholesterol    History of hiatal hernia    Hypertension    IDA (iron deficiency anemia)    Hx   MMT (medial meniscus tear)    right   Obesity    Vertigo     Family History  Problem Relation Age of Onset   Diabetes Mother    Heart failure Mother    Heart failure Father    Colon cancer Neg Hx    Rectal cancer Neg Hx    Stomach cancer Neg Hx    Esophageal cancer Neg Hx    Liver cancer Neg Hx    Pancreatic cancer Neg Hx     Past Surgical History:  Procedure Laterality Date   BICEPT TENODESIS Right 07/15/2023   Procedure: BICEPS TENODESIS;  Surgeon: Addie Cordella Hamilton, MD;  Location: [facility] OR;  Service: Orthopedics;  Laterality: Right;   CHOLECYSTECTOMY     COLONOSCOPY  09/19/2013   Dr Dyane [facility]e [facility]  The entire  examined colon is normal   DILATATION & CURETTAGE/HYSTEROSCOPY WITH MYOSURE N/A 11/09/2022   Procedure: DILATATION & CURETTAGE/HYSTEROSCOPY WITH MYOSURE;  Surgeon: Johnnye Ade, MD;  Location: [facility];  Service: Gynecology;  Laterality: N/A;   KNEE ARTHROSCOPY WITH MEDIAL MENISECTOMY Right 06/22/2018   Procedure: RIGHT KNEE ARTHROSCOPY WITH MEDIAL MENISCECTOMY, CHONDROPLASTY;  Surgeon: Jerri Kay HERO, MD;  Location: [facility];  Service: Orthopedics;  Laterality: Right;   REVERSE SHOULDER ARTHROPLASTY Right 07/15/2023   Procedure: REVERSE SHOULDER ARTHROPLASTY;  Surgeon: Addie Cordella Hamilton, MD;  Location: [facility] OR;  Service: Orthopedics;  Laterality: Right;   SHOULDER ARTHROSCOPY     removal of bone spur   TONSILLECTOMY     TUBAL LIGATION  2000   Social History   Occupational History   Not on file  Tobacco Use   Smoking status: Never   Smokeless tobacco: Never   Tobacco comments:    Quit smoking at 78 years old (smoked for 10 years)  Vaping Use   Vaping status: Never Used  Substance and Sexual Activity   Alcohol use: Not Currently    Comment: rare   Drug use: Never   Sexual activity: Yes    Birth control/protection:  Post-menopausal

## 2024-08-10 ENCOUNTER — Ambulatory Visit: Admitting: Orthopaedic Surgery
